# Patient Record
Sex: Female | Born: 1955 | Race: White | Hispanic: No | Marital: Married | Smoking: Former smoker
Health system: Southern US, Community
[De-identification: ages and names within clinical notes are randomized; demographics above are authoritative.]

## PROBLEM LIST (undated history)

## (undated) DIAGNOSIS — E785 Hyperlipidemia, unspecified: Secondary | ICD-10-CM

## (undated) DIAGNOSIS — G43909 Migraine, unspecified, not intractable, without status migrainosus: Secondary | ICD-10-CM

## (undated) DIAGNOSIS — K219 Gastro-esophageal reflux disease without esophagitis: Secondary | ICD-10-CM

## (undated) DIAGNOSIS — N870 Mild cervical dysplasia: Secondary | ICD-10-CM

## (undated) DIAGNOSIS — A64 Unspecified sexually transmitted disease: Secondary | ICD-10-CM

## (undated) DIAGNOSIS — F419 Anxiety disorder, unspecified: Secondary | ICD-10-CM

## (undated) DIAGNOSIS — M542 Cervicalgia: Secondary | ICD-10-CM

## (undated) DIAGNOSIS — Z872 Personal history of diseases of the skin and subcutaneous tissue: Secondary | ICD-10-CM

## (undated) DIAGNOSIS — T7840XA Allergy, unspecified, initial encounter: Secondary | ICD-10-CM

## (undated) DIAGNOSIS — I10 Essential (primary) hypertension: Secondary | ICD-10-CM

## (undated) HISTORY — DX: Migraine, unspecified, not intractable, without status migrainosus: G43.909

## (undated) HISTORY — DX: Cervicalgia: M54.2

## (undated) HISTORY — DX: Essential (primary) hypertension: I10

## (undated) HISTORY — DX: Allergy, unspecified, initial encounter: T78.40XA

## (undated) HISTORY — DX: Gastro-esophageal reflux disease without esophagitis: K21.9

## (undated) HISTORY — DX: Mild cervical dysplasia: N87.0

## (undated) HISTORY — PX: OOPHORECTOMY: SHX86

## (undated) HISTORY — DX: Hyperlipidemia, unspecified: E78.5

## (undated) HISTORY — DX: Personal history of diseases of the skin and subcutaneous tissue: Z87.2

## (undated) HISTORY — PX: CHOLECYSTECTOMY: SHX55

## (undated) HISTORY — PX: PELVIC LAPAROSCOPY: SHX162

## (undated) HISTORY — PX: BACK SURGERY: SHX140

## (undated) HISTORY — DX: Anxiety disorder, unspecified: F41.9

## (undated) HISTORY — PX: COLPOSCOPY: SHX161

## (undated) HISTORY — PX: CARPAL TUNNEL RELEASE: SHX101

## (undated) HISTORY — PX: TONSILLECTOMY: SUR1361

## (undated) HISTORY — DX: Unspecified sexually transmitted disease: A64

---

## 1998-07-02 HISTORY — PX: VAGINAL HYSTERECTOMY: SUR661

## 1998-10-17 ENCOUNTER — Inpatient Hospital Stay (HOSPITAL_COMMUNITY): Admission: RE | Admit: 1998-10-17 | Discharge: 1998-10-19 | Payer: Self-pay | Admitting: Obstetrics and Gynecology

## 1999-09-28 ENCOUNTER — Other Ambulatory Visit: Admission: RE | Admit: 1999-09-28 | Discharge: 1999-09-28 | Payer: Self-pay | Admitting: Obstetrics and Gynecology

## 2000-05-02 ENCOUNTER — Observation Stay (HOSPITAL_COMMUNITY): Admission: RE | Admit: 2000-05-02 | Discharge: 2000-05-03 | Payer: Self-pay | Admitting: Obstetrics and Gynecology

## 2000-06-04 ENCOUNTER — Emergency Department (HOSPITAL_COMMUNITY): Admission: EM | Admit: 2000-06-04 | Discharge: 2000-06-04 | Payer: Self-pay | Admitting: Emergency Medicine

## 2000-06-04 ENCOUNTER — Encounter: Payer: Self-pay | Admitting: Emergency Medicine

## 2000-06-07 ENCOUNTER — Encounter: Payer: Self-pay | Admitting: Neurology

## 2000-06-07 ENCOUNTER — Encounter: Admission: RE | Admit: 2000-06-07 | Discharge: 2000-06-07 | Payer: Self-pay | Admitting: Neurology

## 2000-07-18 ENCOUNTER — Encounter: Payer: Self-pay | Admitting: Family Medicine

## 2000-07-18 ENCOUNTER — Ambulatory Visit (HOSPITAL_COMMUNITY): Admission: RE | Admit: 2000-07-18 | Discharge: 2000-07-18 | Payer: Self-pay | Admitting: Family Medicine

## 2000-10-29 ENCOUNTER — Other Ambulatory Visit: Admission: RE | Admit: 2000-10-29 | Discharge: 2000-10-29 | Payer: Self-pay | Admitting: Obstetrics and Gynecology

## 2003-03-16 ENCOUNTER — Other Ambulatory Visit: Admission: RE | Admit: 2003-03-16 | Discharge: 2003-03-16 | Payer: Self-pay | Admitting: Obstetrics and Gynecology

## 2003-04-26 ENCOUNTER — Observation Stay (HOSPITAL_COMMUNITY): Admission: RE | Admit: 2003-04-26 | Discharge: 2003-04-27 | Payer: Self-pay | Admitting: Obstetrics and Gynecology

## 2003-07-03 HISTORY — PX: CARPAL TUNNEL RELEASE: SHX101

## 2004-05-01 ENCOUNTER — Other Ambulatory Visit: Admission: RE | Admit: 2004-05-01 | Discharge: 2004-05-01 | Payer: Self-pay | Admitting: Obstetrics and Gynecology

## 2005-02-27 ENCOUNTER — Ambulatory Visit: Payer: Self-pay | Admitting: Internal Medicine

## 2006-03-14 ENCOUNTER — Ambulatory Visit: Payer: Self-pay | Admitting: Internal Medicine

## 2006-08-20 ENCOUNTER — Ambulatory Visit: Payer: Self-pay | Admitting: Internal Medicine

## 2007-09-25 ENCOUNTER — Other Ambulatory Visit: Admission: RE | Admit: 2007-09-25 | Discharge: 2007-09-25 | Payer: Self-pay | Admitting: Obstetrics and Gynecology

## 2007-10-06 ENCOUNTER — Ambulatory Visit (HOSPITAL_COMMUNITY): Admission: RE | Admit: 2007-10-06 | Discharge: 2007-10-06 | Payer: Self-pay | Admitting: Obstetrics and Gynecology

## 2008-01-31 ENCOUNTER — Emergency Department: Payer: Self-pay | Admitting: Emergency Medicine

## 2008-02-15 ENCOUNTER — Emergency Department: Payer: Self-pay | Admitting: Emergency Medicine

## 2008-02-15 ENCOUNTER — Other Ambulatory Visit: Payer: Self-pay

## 2008-02-16 ENCOUNTER — Ambulatory Visit: Payer: Self-pay | Admitting: Internal Medicine

## 2008-06-07 ENCOUNTER — Ambulatory Visit: Payer: Self-pay | Admitting: Unknown Physician Specialty

## 2009-03-02 ENCOUNTER — Other Ambulatory Visit: Admission: RE | Admit: 2009-03-02 | Discharge: 2009-03-02 | Payer: Self-pay | Admitting: Obstetrics and Gynecology

## 2009-03-02 ENCOUNTER — Ambulatory Visit: Payer: Self-pay | Admitting: Obstetrics and Gynecology

## 2009-03-02 ENCOUNTER — Encounter: Payer: Self-pay | Admitting: Obstetrics and Gynecology

## 2009-03-02 ENCOUNTER — Ambulatory Visit (HOSPITAL_COMMUNITY): Admission: RE | Admit: 2009-03-02 | Discharge: 2009-03-02 | Payer: Self-pay | Admitting: Obstetrics and Gynecology

## 2009-12-17 ENCOUNTER — Ambulatory Visit: Payer: Self-pay | Admitting: Ophthalmology

## 2010-04-27 ENCOUNTER — Ambulatory Visit (HOSPITAL_COMMUNITY): Admission: RE | Admit: 2010-04-27 | Discharge: 2010-04-27 | Payer: Self-pay | Admitting: Obstetrics and Gynecology

## 2010-04-27 ENCOUNTER — Ambulatory Visit: Payer: Self-pay | Admitting: Obstetrics and Gynecology

## 2010-04-27 ENCOUNTER — Other Ambulatory Visit: Admission: RE | Admit: 2010-04-27 | Discharge: 2010-04-27 | Payer: Self-pay | Admitting: Obstetrics and Gynecology

## 2011-04-09 ENCOUNTER — Other Ambulatory Visit: Payer: Self-pay | Admitting: Obstetrics and Gynecology

## 2011-04-09 DIAGNOSIS — Z1231 Encounter for screening mammogram for malignant neoplasm of breast: Secondary | ICD-10-CM

## 2011-05-02 ENCOUNTER — Encounter: Payer: Self-pay | Admitting: Gynecology

## 2011-05-02 DIAGNOSIS — G43909 Migraine, unspecified, not intractable, without status migrainosus: Secondary | ICD-10-CM | POA: Insufficient documentation

## 2011-05-10 ENCOUNTER — Ambulatory Visit (HOSPITAL_COMMUNITY): Payer: Self-pay

## 2011-05-11 ENCOUNTER — Ambulatory Visit (HOSPITAL_COMMUNITY)
Admission: RE | Admit: 2011-05-11 | Discharge: 2011-05-11 | Disposition: A | Discharge status: Home / Self Care | Payer: 59 | Source: Ambulatory Visit | Attending: Obstetrics and Gynecology | Admitting: Obstetrics and Gynecology

## 2011-05-11 ENCOUNTER — Ambulatory Visit (INDEPENDENT_AMBULATORY_CARE_PROVIDER_SITE_OTHER): Payer: 59 | Admitting: Obstetrics and Gynecology

## 2011-05-11 ENCOUNTER — Encounter: Payer: Self-pay | Admitting: Obstetrics and Gynecology

## 2011-05-11 VITALS — BP 120/64 | Ht 64.0 in | Wt 169.0 lb

## 2011-05-11 DIAGNOSIS — N393 Stress incontinence (female) (male): Secondary | ICD-10-CM

## 2011-05-11 DIAGNOSIS — Z01419 Encounter for gynecological examination (general) (routine) without abnormal findings: Secondary | ICD-10-CM

## 2011-05-11 DIAGNOSIS — N952 Postmenopausal atrophic vaginitis: Secondary | ICD-10-CM

## 2011-05-11 DIAGNOSIS — Z780 Asymptomatic menopausal state: Secondary | ICD-10-CM

## 2011-05-11 DIAGNOSIS — Z1231 Encounter for screening mammogram for malignant neoplasm of breast: Secondary | ICD-10-CM | POA: Insufficient documentation

## 2011-05-11 DIAGNOSIS — N951 Menopausal and female climacteric states: Secondary | ICD-10-CM

## 2011-05-11 MED ORDER — ESTRADIOL 0.1 MG/GM VA CREA: TOPICAL_CREAM | VAGINAL | Status: DC

## 2011-05-11 NOTE — Progress Notes (Signed)
Patient came to see me today for her annual GYN exam. She is stopped her oral estrogen and is doing well without it. She continues use estrogen cream for vaginal dryness with good results. She has noticed some loss of urine with coughing laughing and sneezing. She continues take Valtrex daily to prevent recurrence of HSV. It seems and working well. She had her mammogram today. She had a bone density in [place] was normal. Her labs done by her PCP. She is having no vaginal bleeding or pelvic pain.  HEENT: Within normal limits. Kennon Portela present Neck: No masses. Supraclavicular lymph nodes: Not enlarged. Breasts: Examined in both sitting and lying position. Symmetrical without skin changes or masses. Abdomen: Soft no masses guarding or rebound. No hernias. Pelvic: External within normal limits. BUS within normal limits. Vaginal examination shows good estrogen effect, no cystocele enterocele or rectocele. Cervix and uterus absent. Adnexa within normal limits. Rectovaginal confirmatory. Extremities within normal limits.  Assessment: #1. Atrophic vaginitis #2. Urinary stress incontinence #3. HSV-2  Plan: Continue estradiol cream. Continue yearly mammograms.continue Valtrex daily. Instructed on Kegel exercises.

## 2011-09-29 ENCOUNTER — Emergency Department: Payer: Self-pay | Admitting: Emergency Medicine

## 2011-09-29 LAB — CBC
HCT: 46.8 % (ref 35.0–47.0)
HGB: 15.7 g/dL (ref 12.0–16.0)
MCHC: 33.6 g/dL (ref 32.0–36.0)
RBC: 5.13 10*6/uL (ref 3.80–5.20)

## 2011-09-29 LAB — URINALYSIS, COMPLETE
Glucose,UR: NEGATIVE mg/dL (ref 0–75)
Leukocyte Esterase: NEGATIVE
Nitrite: NEGATIVE
Ph: 5 (ref 4.5–8.0)
Protein: NEGATIVE
RBC,UR: 2 /HPF (ref 0–5)
Specific Gravity: 1.024 (ref 1.003–1.030)
Squamous Epithelial: 1
WBC UR: 2 /HPF (ref 0–5)

## 2011-09-29 LAB — COMPREHENSIVE METABOLIC PANEL
Alkaline Phosphatase: 91 U/L (ref 50–136)
Anion Gap: 11 (ref 7–16)
BUN: 17 mg/dL (ref 7–18)
Bilirubin,Total: 0.8 mg/dL (ref 0.2–1.0)
Chloride: 104 mmol/L (ref 98–107)
EGFR (African American): >60
Glucose: 107 mg/dL — ABNORMAL HIGH (ref 65–99)
Osmolality: 287 (ref 275–301)
SGOT(AST): 23 U/L (ref 15–37)
SGPT (ALT): 30 U/L
Sodium: 143 mmol/L (ref 136–145)
Total Protein: 6.8 g/dL (ref 6.4–8.2)

## 2011-09-29 LAB — LIPASE, BLOOD: Lipase: 138 U/L (ref 73–393)

## 2012-05-20 ENCOUNTER — Encounter: Payer: Self-pay | Admitting: Obstetrics and Gynecology

## 2012-05-20 ENCOUNTER — Ambulatory Visit (INDEPENDENT_AMBULATORY_CARE_PROVIDER_SITE_OTHER): Payer: 59 | Admitting: Obstetrics and Gynecology

## 2012-05-20 VITALS — BP 120/78 | Ht 65.0 in | Wt 158.0 lb

## 2012-05-20 DIAGNOSIS — Z01419 Encounter for gynecological examination (general) (routine) without abnormal findings: Secondary | ICD-10-CM

## 2012-05-20 MED ORDER — VALACYCLOVIR HCL 500 MG PO TABS: 500.0000 mg | ORAL_TABLET | Freq: Every day | ORAL | Status: DC

## 2012-05-20 MED ORDER — TERCONAZOLE 0.8 % VA CREA: 1.0000 | TOPICAL_CREAM | Freq: Every day | VAGINAL | Status: DC

## 2012-05-20 MED ORDER — OSPEMIFENE 60 MG PO TABS: 60.0000 mg | ORAL_TABLET | Freq: Every day | ORAL | Status: DC

## 2012-05-20 NOTE — Progress Notes (Signed)
Patient came to see me today for her annual GYN exam. In 2000 she had a vaginal hysterectomy for endometriosis and recurrent cervical dysplasia. Since then she has had normal yearly Pap smears. Her last Pap smear was 2012. 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 In 2004 her pain recurred and she had diagnostic laparoscopy with right salpingo-oophorectomy with findings of pelvic adhesive disease.She is due for her mammogram. She had a normal bone density in April, 2009. She uses daily Valtrex to prevent HSV outbreaks with excellent results. She continues to have dyspareunia. We have tried estrogen cream but it is not  helped  completely. We have wondered if it isn't due to pelvic adhesive disease.She does her lab through PCP.  HEENT: Within normal limits. Neck: No masses. Supraclavicular lymph nodes: Not enlarged. Breasts: Examined in both sitting and lying position. Symmetrical without skin changes or masses. Abdomen: Soft no masses guarding or rebound. No hernias. Pelvic: External within normal limits. BUS within normal limits. Vaginal examination shows fair estrogen effect, no cystocele enterocele or rectocele. The patient has point tenderness at the top of the vaginal cuff which is where she gets dyspareunia. Rugae are diminished there. Cervix and uterus absent. Adnexa within normal limits. Rectovaginal confirmatory. Extremities within normal limits.  Assessment: #1. Atrophic vaginitis #2. HSV-2 #3. Recurrent CIN  Plan: Patient had read about osphenia. Since her exam is not completely normal in terms of estrogen effect I think it is worth switching her. We discussed the risk of DVT and CNS bleed. She will do 60 mg daily. She will continue Valtrex daily. She will schedule mammogram and bone density. She is now had normal Pap smears since 2000 so we did not do a Pap today but I think she should continue periodic Paps.

## 2012-05-20 NOTE — Patient Instructions (Signed)
Schedule mammogram and bone density.

## 2012-05-20 NOTE — Addendum Note (Signed)
Addended by: Dayna Barker on: 05/20/2012 03:29 PM   Modules accepted: Orders

## 2012-05-27 ENCOUNTER — Other Ambulatory Visit: Payer: Self-pay | Admitting: Cardiology

## 2012-05-27 ENCOUNTER — Other Ambulatory Visit: Payer: Self-pay | Admitting: Obstetrics and Gynecology

## 2012-05-27 DIAGNOSIS — Z1231 Encounter for screening mammogram for malignant neoplasm of breast: Secondary | ICD-10-CM

## 2012-05-30 ENCOUNTER — Encounter: Payer: Self-pay | Admitting: Obstetrics and Gynecology

## 2012-06-11 ENCOUNTER — Other Ambulatory Visit: Payer: Self-pay | Admitting: Obstetrics and Gynecology

## 2012-06-11 DIAGNOSIS — Z13820 Encounter for screening for osteoporosis: Secondary | ICD-10-CM

## 2012-06-12 ENCOUNTER — Ambulatory Visit (INDEPENDENT_AMBULATORY_CARE_PROVIDER_SITE_OTHER): Payer: 59

## 2012-06-12 DIAGNOSIS — Z13820 Encounter for screening for osteoporosis: Secondary | ICD-10-CM

## 2012-06-17 ENCOUNTER — Ambulatory Visit (HOSPITAL_COMMUNITY)
Admission: RE | Admit: 2012-06-17 | Discharge: 2012-06-17 | Disposition: A | Discharge status: Home / Self Care | Payer: 59 | Source: Ambulatory Visit | Attending: Obstetrics and Gynecology | Admitting: Obstetrics and Gynecology

## 2012-06-17 DIAGNOSIS — Z1231 Encounter for screening mammogram for malignant neoplasm of breast: Secondary | ICD-10-CM | POA: Insufficient documentation

## 2013-02-19 ENCOUNTER — Ambulatory Visit: Payer: Self-pay

## 2013-04-29 ENCOUNTER — Other Ambulatory Visit: Payer: Self-pay | Admitting: Gynecology

## 2013-04-29 DIAGNOSIS — Z1231 Encounter for screening mammogram for malignant neoplasm of breast: Secondary | ICD-10-CM

## 2013-06-18 ENCOUNTER — Encounter: Payer: Self-pay | Admitting: Gynecology

## 2013-06-18 ENCOUNTER — Ambulatory Visit (INDEPENDENT_AMBULATORY_CARE_PROVIDER_SITE_OTHER): Payer: 59 | Admitting: Gynecology

## 2013-06-18 VITALS — BP 116/76 | Ht 65.0 in | Wt 164.0 lb

## 2013-06-18 DIAGNOSIS — Z01419 Encounter for gynecological examination (general) (routine) without abnormal findings: Secondary | ICD-10-CM

## 2013-06-18 DIAGNOSIS — D179 Benign lipomatous neoplasm, unspecified: Secondary | ICD-10-CM

## 2013-06-18 DIAGNOSIS — B009 Herpesviral infection, unspecified: Secondary | ICD-10-CM

## 2013-06-18 DIAGNOSIS — N898 Other specified noninflammatory disorders of vagina: Secondary | ICD-10-CM

## 2013-06-18 DIAGNOSIS — L293 Anogenital pruritus, unspecified: Secondary | ICD-10-CM

## 2013-06-18 DIAGNOSIS — A609 Anogenital herpesviral infection, unspecified: Secondary | ICD-10-CM

## 2013-06-18 DIAGNOSIS — N952 Postmenopausal atrophic vaginitis: Secondary | ICD-10-CM

## 2013-06-18 LAB — LIPID PANEL
Cholesterol: 166 mg/dL (ref 0–200)
Total CHOL/HDL Ratio: 2.4 Ratio

## 2013-06-18 LAB — CBC WITH DIFFERENTIAL/PLATELET
Eosinophils Relative: 1 % (ref 0–5)
HCT: 40 % (ref 36.0–46.0)
Lymphocytes Relative: 16 % (ref 12–46)
Lymphs Abs: 1 10*3/uL (ref 0.7–4.0)
MCV: 89.1 fL (ref 78.0–100.0)
Monocytes Absolute: 0.4 10*3/uL (ref 0.1–1.0)
Neutro Abs: 5 10*3/uL (ref 1.7–7.7)
Platelets: 344 10*3/uL (ref 150–400)
RBC: 4.49 MIL/uL (ref 3.87–5.11)
WBC: 6.6 10*3/uL (ref 4.0–10.5)

## 2013-06-18 LAB — COMPREHENSIVE METABOLIC PANEL
ALT: 18 U/L (ref 0–35)
Albumin: 4.4 g/dL (ref 3.5–5.2)
CO2: 29 mEq/L (ref 19–32)
Calcium: 9.1 mg/dL (ref 8.4–10.5)
Chloride: 103 mEq/L (ref 96–112)
Creat: 0.77 mg/dL (ref 0.50–1.10)
Potassium: 4 mEq/L (ref 3.5–5.3)

## 2013-06-18 LAB — WET PREP FOR TRICH, YEAST, CLUE: Clue Cells Wet Prep HPF POC: NONE SEEN

## 2013-06-18 MED ORDER — ESTRADIOL 0.1 MG/GM VA CREA: TOPICAL_CREAM | VAGINAL | Status: DC

## 2013-06-18 MED ORDER — ACYCLOVIR 400 MG PO TABS: 400.0000 mg | ORAL_TABLET | Freq: Every day | ORAL | Status: DC

## 2013-06-18 MED ORDER — METRONIDAZOLE 0.75 % VA GEL: 1.0000 | Freq: Two times a day (BID) | VAGINAL | Status: DC

## 2013-06-18 NOTE — Patient Instructions (Signed)
Office will call you to help arrange for the general surgical evaluation for the mass on your left shoulder. Call your gastroenterologist today in reference to your abdominal discomfort. Followup with me in one year, sooner if any issues.

## 2013-06-18 NOTE — Progress Notes (Signed)
Kathy Mccormick [birth year] [id number]        57 y.o.  G1P0010 for annual exam.  Former patient of Dr. Eda Mccormick. Several issues noted below.  Past medical history,surgical history, problem list, medications, allergies, family history and social history were all reviewed and documented in the EPIC chart.  ROS:  Performed and pertinent positives and negatives are included in the history, assessment and plan .  Exam: Kathy Mccormick assistant Filed Vitals:   06/18/13 0813  BP: 116/76  Height: 5\' 5"  (1.651 m)  Weight: 164 lb (74.39 kg)   General appearance  Normal Skin 3-4 cm lipoma left posterior shoulder. No overlying skin changes. Freely mobile, nontender Head/Neck normal with no cervical or supraclavicular adenopathy thyroid normal Lungs  clear Cardiac RR, without RMG Abdominal  soft, nontender, without masses, organomegaly or hernia Breasts  examined lying and sitting without masses, retractions, discharge or axillary adenopathy. Pelvic  Ext/BUS/vagina  Normal with mild atrophic changes.   Adnexa  Without masses or tenderness    Anus and perineum  Normal   Rectovaginal  Normal sphincter tone without palpated masses or tenderness.    Assessment/Plan:  56 y.o. G35P0010 female for annual exam.   1. Postmenopausal/atrophic vaginitis. Status post vaginal hysterectomy 2000 for endometriosis subsequent RSO and LSO as separate procedures 2001 in 2004. Had been on ERT but discontinued and not having any issues with hot flashes or night sweats. She has been having problems with vaginal dryness and dyspareunia and has been on estradiol vaginal cream twice weekly. She has run out and has not used it for the past month or so he notes some vaginal irritation and pruritus. A wet prep today is negative and I think that her symptoms are due to the lack of estrogen.  I reviewed the whole issue of HRT with her to include the WHI study with increased risk of stroke, heart attack, DVT and breast cancer. The ACOG and  NAMS statements for lowest dose for the shortest period of time reviewed. Transdermal versus oral first-pass effect benefit discussed.  Vaginal options to include estradiol, formulated estrogen cream, Vagifem and Osphena discussed. The pros/cons, risks/benefits of each reviewed. Possibilities of absorption with global risks as outlined above also discussed. Patient's comfortable with vaginal estrogen cream I prescribed her x1 year. It actually was written on the prescription for 3 times weekly but she uses it twice weekly. 2. Lipoma left posterior shoulder. Patient notes the last 6 months or so an enlarging left shoulder mass. It appears to be a classic lipoma on exam. As it does seem to be enlarging up recommended she see a general surgeon and have it excised and she agrees with this will help her make this arrangement. 3. Genital herpes. Takes acyclovir 400 mg daily for suppression doing well wants to continue and I refilled her x1 year. 4. Pap smear 2012. No Pap smear done today. 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 Verl Mccormick note and normal Pap smears afterwards. Options stop screening altogether versus less frequent screening intervals reviewed. We'll plan on every three-year Pap smears for now. Repeat Pap smear next year 3 year interval. 5. Mammography 06/2012. Patient to schedule mammogram now. Continue with annual mammography is. SBE monthly reviewed. 6. DEXA 2013 normal. Recommend repeat at 5 year interval. 7. Colonoscopy several days ago. She still notes discomfort in the lower abdomen. Her exam is benign today. I've recommended she call her gastroenterologist today in followup as they recommend.  8. Health maintenance. Baseline CBC  comprehensive metabolic panel lipid profile TSH urinalysis vitamin D done. Followup one year, sooner as needed.   Note: This document was prepared with digital dictation and possible smart phrase technology. Any transcriptional errors that  result from this process are unintentional.   Dara Lords MD, 9:07 AM 06/18/2013

## 2013-06-19 ENCOUNTER — Telehealth: Payer: Self-pay | Admitting: Gynecology

## 2013-06-19 ENCOUNTER — Ambulatory Visit (HOSPITAL_COMMUNITY)
Admission: RE | Admit: 2013-06-19 | Discharge: 2013-06-19 | Disposition: A | Discharge status: Home / Self Care | Payer: 59 | Source: Ambulatory Visit | Attending: Gynecology | Admitting: Gynecology

## 2013-06-19 ENCOUNTER — Telehealth: Payer: Self-pay | Admitting: *Deleted

## 2013-06-19 DIAGNOSIS — R7309 Other abnormal glucose: Secondary | ICD-10-CM

## 2013-06-19 DIAGNOSIS — Z1231 Encounter for screening mammogram for malignant neoplasm of breast: Secondary | ICD-10-CM | POA: Insufficient documentation

## 2013-06-19 DIAGNOSIS — D1720 Benign lipomatous neoplasm of skin and subcutaneous tissue of unspecified limb: Secondary | ICD-10-CM

## 2013-06-19 LAB — URINALYSIS W MICROSCOPIC + REFLEX CULTURE
Bilirubin Urine: NEGATIVE
Glucose, UA: NEGATIVE mg/dL
Leukocytes, UA: NEGATIVE
Protein, ur: NEGATIVE mg/dL
RBC / HPF: NONE SEEN RBC/hpf (ref <3)
Specific Gravity, Urine: 1.019 (ref 1.005–1.030)
WBC, UA: NONE SEEN WBC/hpf (ref <3)
pH: 6 (ref 5.0–8.0)

## 2013-06-19 NOTE — Telephone Encounter (Signed)
Pt informed,order placed for recheck

## 2013-06-19 NOTE — Telephone Encounter (Signed)
Appointment on 07/07/13 @ 1:15 pm. Pt informed with the below.

## 2013-06-19 NOTE — Telephone Encounter (Signed)
Message copied by Aura Camps on Fri Jun 19, 2013 10:56 AM ------      Message from: Dara Lords      Created: Thu Jun 18, 2013  9:12 AM       Schedule an appointment with General surgery reference enlarging left shoulder lipoma, desires removal ------

## 2013-06-19 NOTE — Telephone Encounter (Signed)
Tell patient her glucose is minimally elevated. Remainder of her lab work was normal. Recommend repeating a fasting glucose at her convenience.

## 2013-06-24 ENCOUNTER — Encounter: Payer: Self-pay | Admitting: Obstetrics and Gynecology

## 2013-07-07 ENCOUNTER — Ambulatory Visit (INDEPENDENT_AMBULATORY_CARE_PROVIDER_SITE_OTHER): Payer: Self-pay | Admitting: Surgery

## 2013-08-18 ENCOUNTER — Ambulatory Visit (INDEPENDENT_AMBULATORY_CARE_PROVIDER_SITE_OTHER): Payer: Self-pay | Admitting: Surgery

## 2013-09-02 ENCOUNTER — Ambulatory Visit (INDEPENDENT_AMBULATORY_CARE_PROVIDER_SITE_OTHER): Payer: Self-pay | Admitting: Surgery

## 2013-10-15 ENCOUNTER — Encounter (INDEPENDENT_AMBULATORY_CARE_PROVIDER_SITE_OTHER): Payer: Self-pay | Admitting: Surgery

## 2013-10-15 ENCOUNTER — Ambulatory Visit (INDEPENDENT_AMBULATORY_CARE_PROVIDER_SITE_OTHER): Payer: 59 | Admitting: Surgery

## 2013-10-16 ENCOUNTER — Encounter (INDEPENDENT_AMBULATORY_CARE_PROVIDER_SITE_OTHER): Payer: Self-pay | Admitting: Surgery

## 2013-10-19 NOTE — Progress Notes (Signed)
I never saw this patient.  Patient canceled appointment

## 2013-10-23 NOTE — Progress Notes (Signed)
Patient did not come to clinic.  Patient was not seen.  This is an error 

## 2013-10-23 NOTE — Progress Notes (Signed)
Patient did not come to clinic.  Patient was not seen.  This is an error

## 2013-11-19 NOTE — Progress Notes (Signed)
Patient did not come to clinic.  Patient was not seen.  This is an error 

## 2014-04-06 ENCOUNTER — Other Ambulatory Visit: Payer: Self-pay | Admitting: Gynecology

## 2014-04-06 DIAGNOSIS — Z1231 Encounter for screening mammogram for malignant neoplasm of breast: Secondary | ICD-10-CM

## 2014-05-03 ENCOUNTER — Encounter (INDEPENDENT_AMBULATORY_CARE_PROVIDER_SITE_OTHER): Payer: Self-pay | Admitting: Surgery

## 2014-06-03 ENCOUNTER — Encounter: Payer: Self-pay | Admitting: *Deleted

## 2014-06-14 ENCOUNTER — Ambulatory Visit: Payer: 59 | Admitting: General Surgery

## 2014-06-21 ENCOUNTER — Ambulatory Visit (INDEPENDENT_AMBULATORY_CARE_PROVIDER_SITE_OTHER): Payer: 59 | Admitting: Gynecology

## 2014-06-21 ENCOUNTER — Other Ambulatory Visit (HOSPITAL_COMMUNITY)
Admission: RE | Admit: 2014-06-21 | Discharge: 2014-06-21 | Disposition: A | Discharge status: Home / Self Care | Payer: 59 | Source: Ambulatory Visit | Attending: Gynecology | Admitting: Gynecology

## 2014-06-21 ENCOUNTER — Encounter: Payer: Self-pay | Admitting: Gynecology

## 2014-06-21 ENCOUNTER — Ambulatory Visit (HOSPITAL_COMMUNITY)
Admission: RE | Admit: 2014-06-21 | Discharge: 2014-06-21 | Disposition: A | Discharge status: Home / Self Care | Payer: 59 | Source: Ambulatory Visit | Attending: Gynecology | Admitting: Gynecology

## 2014-06-21 VITALS — BP 120/76 | Ht 65.0 in | Wt 164.0 lb

## 2014-06-21 DIAGNOSIS — Z01419 Encounter for gynecological examination (general) (routine) without abnormal findings: Secondary | ICD-10-CM | POA: Diagnosis present

## 2014-06-21 DIAGNOSIS — D179 Benign lipomatous neoplasm, unspecified: Secondary | ICD-10-CM

## 2014-06-21 DIAGNOSIS — Z1231 Encounter for screening mammogram for malignant neoplasm of breast: Secondary | ICD-10-CM

## 2014-06-21 MED ORDER — ACYCLOVIR 400 MG PO TABS: 400.0000 mg | ORAL_TABLET | Freq: Every day | ORAL | Status: DC

## 2014-06-21 MED ORDER — ESTRADIOL 0.1 MG/GM VA CREA: TOPICAL_CREAM | VAGINAL | Status: DC

## 2014-06-21 NOTE — Patient Instructions (Signed)
You may obtain a copy of any labs that were done today by logging onto MyChart as outlined in the instructions provided with your AVS (after visit summary). The office will not call with normal lab results but certainly if there are any significant abnormalities then we will contact you.   Health Maintenance, Female A healthy lifestyle and preventative care can promote health and wellness.  Maintain regular health, dental, and eye exams.  Eat a healthy diet. Foods like vegetables, fruits, whole grains, low-fat dairy products, and lean protein foods contain the nutrients you need without too many calories. Decrease your intake of foods high in solid fats, added sugars, and salt. Get information about a proper diet from your caregiver, if necessary.  Regular physical exercise is one of the most important things you can do for your health. Most adults should get at least 150 minutes of moderate-intensity exercise (any activity that increases your heart rate and causes you to sweat) each week. In addition, most adults need muscle-strengthening exercises on 2 or more days a week.   Maintain a healthy weight. The body mass index (BMI) is a screening tool to identify possible weight problems. It provides an estimate of body fat based on height and weight. Your caregiver can help determine your BMI, and can help you achieve or maintain a healthy weight. For adults 20 years and older:  A BMI below 18.5 is considered underweight.  A BMI of 18.5 to 24.9 is normal.  A BMI of 25 to 29.9 is considered overweight.  A BMI of 30 and above is considered obese.  Maintain normal blood lipids and cholesterol by exercising and minimizing your intake of saturated fat. Eat a balanced diet with plenty of fruits and vegetables. Blood tests for lipids and cholesterol should begin at age 61 and be repeated every 5 years. If your lipid or cholesterol levels are high, you are over 50, or you are a high risk for heart  disease, you may need your cholesterol levels checked more frequently.Ongoing high lipid and cholesterol levels should be treated with medicines if diet and exercise are not effective.  If you smoke, find out from your caregiver how to quit. If you do not use tobacco, do not start.  Lung cancer screening is recommended for adults aged 33 80 years who are at high risk for developing lung cancer because of a history of smoking. Yearly low-dose computed tomography (CT) is recommended for people who have at least a 30-pack-year history of smoking and are a current smoker or have quit within the past 15 years. A pack year of smoking is smoking an average of 1 pack of cigarettes a day for 1 year (for example: 1 pack a day for 30 years or 2 packs a day for 15 years). Yearly screening should continue until the smoker has stopped smoking for at least 15 years. Yearly screening should also be stopped for people who develop a health problem that would prevent them from having lung cancer treatment.  If you are pregnant, do not drink alcohol. If you are breastfeeding, be very cautious about drinking alcohol. If you are not pregnant and choose to drink alcohol, do not exceed 1 drink per day. One drink is considered to be 12 ounces (355 mL) of beer, 5 ounces (148 mL) of wine, or 1.5 ounces (44 mL) of liquor.  Avoid use of street drugs. Do not share needles with anyone. Ask for help if you need support or instructions about stopping  the use of drugs.  High blood pressure causes heart disease and increases the risk of stroke. Blood pressure should be checked at least every 1 to 2 years. Ongoing high blood pressure should be treated with medicines, if weight loss and exercise are not effective.  If you are 59 to 56 years old, ask your caregiver if you should take aspirin to prevent strokes.  Diabetes screening involves taking a blood sample to check your fasting blood sugar level. This should be done once every 3  years, after age 91, if you are within normal weight and without risk factors for diabetes. Testing should be considered at a younger age or be carried out more frequently if you are overweight and have at least 1 risk factor for diabetes.  Breast cancer screening is essential preventative care for women. You should practice "breast self-awareness." This means understanding the normal appearance and feel of your breasts and may include breast self-examination. Any changes detected, no matter how small, should be reported to a caregiver. Women in their 66s and 30s should have a clinical breast exam (CBE) by a caregiver as part of a regular health exam every 1 to 3 years. After age 101, women should have a CBE every year. Starting at age 100, women should consider having a mammogram (breast X-ray) every year. Women who have a family history of breast cancer should talk to their caregiver about genetic screening. Women at a high risk of breast cancer should talk to their caregiver about having an MRI and a mammogram every year.  Breast cancer gene (BRCA)-related cancer risk assessment is recommended for women who have family members with BRCA-related cancers. BRCA-related cancers include breast, ovarian, tubal, and peritoneal cancers. Having family members with these cancers may be associated with an increased risk for harmful changes (mutations) in the breast cancer genes BRCA1 and BRCA2. Results of the assessment will determine the need for genetic counseling and BRCA1 and BRCA2 testing.  The Pap test is a screening test for cervical cancer. Women should have a Pap test starting at age 57. Between ages 25 and 35, Pap tests should be repeated every 2 years. Beginning at age 37, you should have a Pap test every 3 years as long as the past 3 Pap tests have been normal. If you had a hysterectomy for a problem that was not cancer or a condition that could lead to cancer, then you no longer need Pap tests. If you are  between ages 50 and 76, and you have had normal Pap tests going back 10 years, you no longer need Pap tests. If you have had past treatment for cervical cancer or a condition that could lead to cancer, you need Pap tests and screening for cancer for at least 20 years after your treatment. If Pap tests have been discontinued, risk factors (such as a new sexual partner) need to be reassessed to determine if screening should be resumed. Some women have medical problems that increase the chance of getting cervical cancer. In these cases, your caregiver may recommend more frequent screening and Pap tests.  The human papillomavirus (HPV) test is an additional test that may be used for cervical cancer screening. The HPV test looks for the virus that can cause the cell changes on the cervix. The cells collected during the Pap test can be tested for HPV. The HPV test could be used to screen women aged 44 years and older, and should be used in women of any age  who have unclear Pap test results. After the age of 55, women should have HPV testing at the same frequency as a Pap test.  Colorectal cancer can be detected and often prevented. Most routine colorectal cancer screening begins at the age of 44 and continues through age 20. However, your caregiver may recommend screening at an earlier age if you have risk factors for colon cancer. On a yearly basis, your caregiver may provide home test kits to check for hidden blood in the stool. Use of a small camera at the end of a tube, to directly examine the colon (sigmoidoscopy or colonoscopy), can detect the earliest forms of colorectal cancer. Talk to your caregiver about this at age 86, when routine screening begins. Direct examination of the colon should be repeated every 5 to 10 years through age 13, unless early forms of pre-cancerous polyps or small growths are found.  Hepatitis C blood testing is recommended for all people born from [birth year] through 1963 and any  individual with known risks for hepatitis C.  Practice safe sex. Use condoms and avoid high-risk sexual practices to reduce the spread of sexually transmitted infections (STIs). Sexually active women aged 36 and younger should be checked for Chlamydia, which is a common sexually transmitted infection. Older women with new or multiple partners should also be tested for Chlamydia. Testing for other STIs is recommended if you are sexually active and at increased risk.  Osteoporosis is a disease in which the bones lose minerals and strength with aging. This can result in serious bone fractures. The risk of osteoporosis can be identified using a bone density scan. Women ages 20 and over and women at risk for fractures or osteoporosis should discuss screening with their caregivers. Ask your caregiver whether you should be taking a calcium supplement or vitamin D to reduce the rate of osteoporosis.  Menopause can be associated with physical symptoms and risks. Hormone replacement therapy is available to decrease symptoms and risks. You should talk to your caregiver about whether hormone replacement therapy is right for you.  Use sunscreen. Apply sunscreen liberally and repeatedly throughout the day. You should seek shade when your shadow is shorter than you. Protect yourself by wearing long sleeves, pants, a wide-brimmed hat, and sunglasses year round, whenever you are outdoors.  Notify your caregiver of new moles or changes in moles, especially if there is a change in shape or color. Also notify your caregiver if a mole is larger than the size of a pencil eraser.  Stay current with your immunizations. Document Released: 01/01/2011 Document Revised: 10/13/2012 Document Reviewed: 01/01/2011 [facility] Patient Information 2014 [employer].

## 2014-06-21 NOTE — Progress Notes (Signed)
Kathy Mccormick 1955-12-02 [id number]        58 y.o.  G1P0010 for annual exam.  Several issues noted below.  Past medical history,surgical history, problem list, medications, allergies, family history and social history were all reviewed and documented as reviewed in the EPIC chart.  ROS:  Performed with pertinent positives and negatives included in the history, assessment and plan.   Additional significant findings :  none   Exam: [name] Counsellor Vitals:   06/21/14 0815  BP: 120/76  Height: 5\' 5"  (1.651 m)  Weight: 164 lb (74.39 kg)   General appearance:  Normal affect, orientation and appearance. Skin: Grossly normal excepting 3-4 cm classic lipoma left upper arm. HEENT: Without gross lesions.  No cervical or supraclavicular adenopathy. Thyroid normal.  Lungs:  Clear without wheezing, rales or rhonchi Cardiac: RR, without RMG Abdominal:  Soft, nontender, without masses, guarding, rebound, organomegaly or hernia Breasts:  Examined lying and sitting without masses, retractions, discharge or axillary adenopathy. Pelvic:  Ext/BUS/vagina normal. Pap done  Adnexa  Without masses or tenderness    Anus and perineum  Normal   Rectovaginal  Normal sphincter tone without palpated masses or tenderness.    Assessment/Plan:  58 y.o. G5P0010 female for annual exam.   1. Status post TVH in the past. Using Estrace vaginal cream twice weekly with good results. We reviewed again the risks of absorption include thrombosis/breast cancer. Patient's comfortable continuing I refilled her 1 year. 2. History HSV using acyclovir 400 mg daily suppression doing well. Wants to continue. Refill 1 year provided. 3. Lipoma left upper arm. Patient notes it slowly getting bigger. Recommended follow up with general surgeon to have excised and patient agrees to arrange this.  We discussed this last year but she never followed up as arranged. She agrees to call the surgeon's office and has the number. 4. Pap  smear 2012. Pap smear of vaginal cuff today. History of CIN-1 proceeding her hysterectomy in the past. Options to stop screening altogether or less frequent screening intervals reviewed. Will readdress on annual basis. 5. Mammography 06/2014. Continue with annual mammography. SBE monthly reviewed. 6. Colonoscopy 2014. Repeat at their recommended interval. 7. DEXA 2013 normal. Repeated age 71. Increased calcium vitamin D reviewed. Check vitamin D level today. 8. Health maintenance. Baseline CBC comprehensive metabolic panel lipid profile urinalysis vitamin D TSH ordered. Follow up 1 year, sooner as needed.     [name] MD, 8:58 AM 06/21/2014

## 2014-06-21 NOTE — Addendum Note (Signed)
Addended by: Nelva Nay on: 06/21/2014 09:05 AM   Modules accepted: Orders, SmartSet

## 2014-06-22 LAB — CYTOLOGY - PAP

## 2014-06-30 ENCOUNTER — Encounter: Payer: Self-pay | Admitting: *Deleted

## 2015-05-19 ENCOUNTER — Encounter: Payer: Self-pay | Admitting: Podiatry

## 2015-05-19 ENCOUNTER — Ambulatory Visit (INDEPENDENT_AMBULATORY_CARE_PROVIDER_SITE_OTHER): Payer: 59 | Admitting: Podiatry

## 2015-05-19 VITALS — BP 120/71 | HR 73 | Resp 18

## 2015-05-19 DIAGNOSIS — M722 Plantar fascial fibromatosis: Secondary | ICD-10-CM | POA: Diagnosis not present

## 2015-05-19 DIAGNOSIS — L600 Ingrowing nail: Secondary | ICD-10-CM

## 2015-05-19 DIAGNOSIS — L84 Corns and callosities: Secondary | ICD-10-CM

## 2015-05-19 DIAGNOSIS — M779 Enthesopathy, unspecified: Secondary | ICD-10-CM

## 2015-05-19 DIAGNOSIS — M79673 Pain in unspecified foot: Secondary | ICD-10-CM

## 2015-05-19 NOTE — Progress Notes (Signed)
   Subjective:    Patient ID: Kathy Mccormick, female    DOB: Feb 05, 1956, 59 y.o.   MRN: [id number]  HPI   59 year old female presents the office today requesting new orthotics. She states that she has heel pain, plantar fasciitis as well as tendinitis to her feet for which she gets orthotics. She states that as long as she wears the orthotic she is doing well how they're starting to rub causing a callus on the right foot and should have a new pair made. She denies any swelling or redness. No recent injury or trauma. No tingling or numbness. She also states that she has an ingrown toenail which is greasy taken out of the right big toe. She is asking for this area to be trimmed as a starting to grow back somewhat. She denies any redness or drainage. No swelling. No other complaints at this time.    Review of Systems  All other systems reviewed and are negative.      Objective:   Physical Exam General: AAO x3, NAD  Dermatological: Skin is warm, dry and supple bilateral.  There is evidence of prior partial nail avulsions the right lateral nail border how there is slight incurvation on the proximal nail border. There is mild tailors palpation overlying this area. There is no edema, erythema, drainage/purulence. Hyperkeratotic lesion right foot submetatarsal one. Upon debridement no underlying ulceration, drainage or other signs of infection. There are no open sores, no preulcerative lesions, no rash or signs of infection present.  Vascular: Dorsalis Pedis artery and Posterior Tibial artery pedal pulses are 2/4 bilateral with immedate capillary fill time. Pedal hair growth present. No varicosities and no lower extremity edema present bilateral. There is no pain with calf compression, swelling, warmth, erythema.   Neruologic: Grossly intact via light touch bilateral. Vibratory intact via tuning fork bilateral. Protective threshold with Semmes Wienstein monofilament intact to all pedal sites bilateral.  Patellar and Achilles deep tendon reflexes 2+ bilateral. No Babinski or clonus noted bilateral.   Musculoskeletal: No gross boney pedal deformities bilateral.  There is no tenderness palpation along the course/insertion of the plantar fascia or the Achilles tendon although subjectively this is where she does get tenderness if she does not wear the orthotics.No pain, crepitus, or limitation noted with foot and ankle range of motion bilateral. Muscular strength 5/5 in all groups tested bilateral.  Gait: Unassisted, Nonantalgic.       Assessment & Plan:   59 year old female presents for new orthotics to the plantar fasciitis/tendinitis,  Ingrown toenail,  Hyperkeratotic lesion -Treatment options discussed including all alternatives, risks, and complications -Hyperkeratotic lesion debrided 1 without complication/bleeding -Right hallux toenails debrided treatment with a small portion of ingrown toenail. A small amount of bleeding occurred area was cleaned. Recommended Neosporin and a Band-Aid daily as well as Epson salt soaks. If not healed within 2 weeks or any signs or symptoms of infection to call the office. -She was scanned for orthotics were sent to [facility] labs. -Follow-up in 3 weeks to pick up orthotics or sooner if any problems arise. In the meantime, encouraged to call the office with any questions, concerns, change in symptoms.   Celesta Gentile, DPM

## 2015-06-02 ENCOUNTER — Other Ambulatory Visit: Payer: Self-pay

## 2015-06-02 DIAGNOSIS — Z1231 Encounter for screening mammogram for malignant neoplasm of breast: Secondary | ICD-10-CM

## 2015-06-16 ENCOUNTER — Ambulatory Visit: Payer: 59 | Admitting: *Deleted

## 2015-06-16 DIAGNOSIS — M722 Plantar fascial fibromatosis: Secondary | ICD-10-CM

## 2015-06-16 NOTE — Patient Instructions (Signed)

## 2015-06-16 NOTE — Progress Notes (Addendum)
Patient ID: Kathy Mccormick, female   DOB: 1955/07/08, 59 y.o.   MRN: [id number] Patient presents for orthotic pick up.  Verbal and written break in and wear instructions given.  Patient will follow up in 4 weeks if symptoms worsen or fail to improve. Orthotics are not right per patient.  Multiple pictures were taken to send to manufacturer to make corrections.

## 2015-07-14 ENCOUNTER — Ambulatory Visit: Payer: 59

## 2015-07-14 ENCOUNTER — Ambulatory Visit (INDEPENDENT_AMBULATORY_CARE_PROVIDER_SITE_OTHER): Payer: 59 | Admitting: Gynecology

## 2015-07-14 ENCOUNTER — Ambulatory Visit
Admission: RE | Admit: 2015-07-14 | Discharge: 2015-07-14 | Disposition: A | Discharge status: Home / Self Care | Payer: 59 | Source: Ambulatory Visit

## 2015-07-14 ENCOUNTER — Encounter: Payer: Self-pay | Admitting: Gynecology

## 2015-07-14 ENCOUNTER — Telehealth: Payer: Self-pay | Admitting: *Deleted

## 2015-07-14 VITALS — BP 110/70 | Ht 65.0 in | Wt 167.0 lb

## 2015-07-14 DIAGNOSIS — Z01419 Encounter for gynecological examination (general) (routine) without abnormal findings: Secondary | ICD-10-CM

## 2015-07-14 DIAGNOSIS — Z1329 Encounter for screening for other suspected endocrine disorder: Secondary | ICD-10-CM | POA: Diagnosis not present

## 2015-07-14 DIAGNOSIS — Z1321 Encounter for screening for nutritional disorder: Secondary | ICD-10-CM

## 2015-07-14 DIAGNOSIS — N952 Postmenopausal atrophic vaginitis: Secondary | ICD-10-CM

## 2015-07-14 DIAGNOSIS — Z1231 Encounter for screening mammogram for malignant neoplasm of breast: Secondary | ICD-10-CM

## 2015-07-14 DIAGNOSIS — Z13220 Encounter for screening for lipoid disorders: Secondary | ICD-10-CM

## 2015-07-14 DIAGNOSIS — D179 Benign lipomatous neoplasm, unspecified: Secondary | ICD-10-CM

## 2015-07-14 MED ORDER — NONFORMULARY OR COMPOUNDED ITEM: Status: DC

## 2015-07-14 MED ORDER — ACYCLOVIR 400 MG PO TABS: 400.0000 mg | ORAL_TABLET | Freq: Every day | ORAL | Status: DC

## 2015-07-14 NOTE — Addendum Note (Signed)
Addended by: Nelva Nay on: 07/14/2015 09:51 AM   Modules accepted: Orders

## 2015-07-14 NOTE — Telephone Encounter (Signed)
-----   Message from [name], MD sent at 07/14/2015  9:40 AM EST ----- Call patients [facility] to see if they can formulate vaginal estradiol cream [facility] to insert twice weekly. If they can do prefilled syringes great. If not okay. Dispense 3 month supply refill 1 year. If her pharmacy does not then see if you can find one in [place] that can. Left patient know regardless

## 2015-07-14 NOTE — Addendum Note (Signed)
Addended by: Nelva Nay on: 07/14/2015 09:59 AM   Modules accepted: Orders

## 2015-07-14 NOTE — Telephone Encounter (Signed)
Rx called in pt aware 

## 2015-07-14 NOTE — Patient Instructions (Addendum)
Office will call you to arrange for the vaginal estrogen cream. Call the office if you do not hear within the next 2 weeks.  You may obtain a copy of any labs that were done today by logging onto MyChart as outlined in the instructions provided with your AVS (after visit summary). The office will not call with normal lab results but certainly if there are any significant abnormalities then we will contact you.   Health Maintenance Adopting a healthy lifestyle and getting preventive care can go a long way to promote health and wellness. Talk with your health care provider about what schedule of regular examinations is right for you. This is a good chance for you to check in with your provider about disease prevention and staying healthy. In between checkups, there are plenty of things you can do on your own. Experts have done a lot of research about which lifestyle changes and preventive measures are most likely to keep you healthy. Ask your health care provider for more information. WEIGHT AND DIET  Eat a healthy diet  Be sure to include plenty of vegetables, fruits, low-fat dairy products, and lean protein.  Do not eat a lot of foods high in solid fats, added sugars, or salt.  Get regular exercise. This is one of the most important things you can do for your health.  Most adults should exercise for at least 150 minutes each week. The exercise should increase your heart rate and make you sweat (moderate-intensity exercise).  Most adults should also do strengthening exercises at least twice a week. This is in addition to the moderate-intensity exercise.  Maintain a healthy weight  Body mass index (BMI) is a measurement that can be used to identify possible weight problems. It estimates body fat based on height and weight. Your health care provider can help determine your BMI and help you achieve or maintain a healthy weight.  For females 41 years of age and older:   A BMI below 18.5 is  considered underweight.  A BMI of 18.5 to 24.9 is normal.  A BMI of 25 to 29.9 is considered overweight.  A BMI of 30 and above is considered obese.  Watch levels of cholesterol and blood lipids  You should start having your blood tested for lipids and cholesterol at 58 years of age, then have this test every 5 years.  You may need to have your cholesterol levels checked more often if:  Your lipid or cholesterol levels are high.  You are older than 58 years of age.  You are at high risk for heart disease.  CANCER SCREENING   Lung Cancer  Lung cancer screening is recommended for adults 70-25 years old who are at high risk for lung cancer because of a history of smoking.  A yearly low-dose CT scan of the lungs is recommended for people who:  Currently smoke.  Have quit within the past 15 years.  Have at least a 30-pack-year history of smoking. A pack year is smoking an average of one pack of cigarettes a day for 1 year.  Yearly screening should continue until it has been 15 years since you quit.  Yearly screening should stop if you develop a health problem that would prevent you from having lung cancer treatment.  Breast Cancer  Practice breast self-awareness. This means understanding how your breasts normally appear and feel.  It also means doing regular breast self-exams. Let your health care provider know about any changes, no matter how  small.  If you are in your 20s or 30s, you should have a clinical breast exam (CBE) by a health care provider every 1-3 years as part of a regular health exam.  If you are 40 or older, have a CBE every year. Also consider having a breast X-ray (mammogram) every year.  If you have a family history of breast cancer, talk to your health care provider about genetic screening.  If you are at high risk for breast cancer, talk to your health care provider about having an MRI and a mammogram every year.  Breast cancer gene (BRCA)  assessment is recommended for women who have family members with BRCA-related cancers. BRCA-related cancers include:  Breast.  Ovarian.  Tubal.  Peritoneal cancers.  Results of the assessment will determine the need for genetic counseling and BRCA1 and BRCA2 testing. Cervical Cancer Routine pelvic examinations to screen for cervical cancer are no longer recommended for nonpregnant women who are considered low risk for cancer of the pelvic organs (ovaries, uterus, and vagina) and who do not have symptoms. A pelvic examination may be necessary if you have symptoms including those associated with pelvic infections. Ask your health care provider if a screening pelvic exam is right for you.   The Pap test is the screening test for cervical cancer for women who are considered at risk.  If you had a hysterectomy for a problem that was not cancer or a condition that could lead to cancer, then you no longer need Pap tests.  If you are older than 65 years, and you have had normal Pap tests for the past 10 years, you no longer need to have Pap tests.  If you have had past treatment for cervical cancer or a condition that could lead to cancer, you need Pap tests and screening for cancer for at least 20 years after your treatment.  If you no longer get a Pap test, assess your risk factors if they change (such as having a new sexual partner). This can affect whether you should start being screened again.  Some women have medical problems that increase their chance of getting cervical cancer. If this is the case for you, your health care provider may recommend more frequent screening and Pap tests.  The human papillomavirus (HPV) test is another test that may be used for cervical cancer screening. The HPV test looks for the virus that can cause cell changes in the cervix. The cells collected during the Pap test can be tested for HPV.  The HPV test can be used to screen women 62 years of age and older.  Getting tested for HPV can extend the interval between normal Pap tests from three to five years.  An HPV test also should be used to screen women of any age who have unclear Pap test results.  After 58 years of age, women should have HPV testing as often as Pap tests.  Colorectal Cancer  This type of cancer can be detected and often prevented.  Routine colorectal cancer screening usually begins at 58 years of age and continues through 58 years of age.  Your health care provider may recommend screening at an earlier age if you have risk factors for colon cancer.  Your health care provider may also recommend using home test kits to check for hidden blood in the stool.  A small camera at the end of a tube can be used to examine your colon directly (sigmoidoscopy or colonoscopy). This is done to  to check for the earliest forms of colorectal cancer.  Routine screening usually begins at age 50.  Direct examination of the colon should be repeated every 5-10 years through 58 years of age. However, you may need to be screened more often if early forms of precancerous polyps or small growths are found. Skin Cancer  Check your skin from head to toe regularly.  Tell your health care provider about any new moles or changes in moles, especially if there is a change in a mole's shape or color.  Also tell your health care provider if you have a mole that is larger than the size of a pencil eraser.  Always use sunscreen. Apply sunscreen liberally and repeatedly throughout the day.  Protect yourself by wearing long sleeves, pants, a wide-brimmed hat, and sunglasses whenever you are outside. HEART DISEASE, DIABETES, AND HIGH BLOOD PRESSURE   Have your blood pressure checked at least every 1-2 years. High blood pressure causes heart disease and increases the risk of stroke.  If you are between 55 years and 79 years old, ask your health care provider if you should take aspirin to prevent  strokes.  Have regular diabetes screenings. This involves taking a blood sample to check your fasting blood sugar level.  If you are at a normal weight and have a low risk for diabetes, have this test once every three years after 58 years of age.  If you are overweight and have a high risk for diabetes, consider being tested at a younger age or more often. PREVENTING INFECTION  Hepatitis B  If you have a higher risk for hepatitis B, you should be screened for this virus. You are considered at high risk for hepatitis B if:  You were born in a country where hepatitis B is common. Ask your health care provider which countries are considered high risk.  Your parents were born in a high-risk country, and you have not been immunized against hepatitis B (hepatitis B vaccine).  You have HIV or AIDS.  You use needles to inject street drugs.  You live with someone who has hepatitis B.  You have had sex with someone who has hepatitis B.  You get hemodialysis treatment.  You take certain medicines for conditions, including cancer, organ transplantation, and autoimmune conditions. Hepatitis C  Blood testing is recommended for:  Everyone born from 1945 through 1965.  Anyone with known risk factors for hepatitis C. Sexually transmitted infections (STIs)  You should be screened for sexually transmitted infections (STIs) including gonorrhea and chlamydia if:  You are sexually active and are younger than 58 years of age.  You are older than 58 years of age and your health care provider tells you that you are at risk for this type of infection.  Your sexual activity has changed since you were last screened and you are at an increased risk for chlamydia or gonorrhea. Ask your health care provider if you are at risk.  If you do not have HIV, but are at risk, it may be recommended that you take a prescription medicine daily to prevent HIV infection. This is called pre-exposure prophylaxis  (PrEP). You are considered at risk if:  You are sexually active and do not regularly use condoms or know the HIV status of your partner(s).  You take drugs by injection.  You are sexually active with a partner who has HIV. Talk with your health care provider about whether you are at high risk of being infected with   If you choose to begin PrEP, you should first be tested for HIV. You should then be tested every 3 months for as long as you are taking PrEP.  PREGNANCY   If you are premenopausal and you may become pregnant, ask your health care provider about preconception counseling.  If you may become pregnant, take 400 to 800 micrograms (mcg) of folic acid every day.  If you want to prevent pregnancy, talk to your health care provider about birth control (contraception). OSTEOPOROSIS AND MENOPAUSE   Osteoporosis is a disease in which the bones lose minerals and strength with aging. This can result in serious bone fractures. Your risk for osteoporosis can be identified using a bone density scan.  If you are 65 years of age or older, or if you are at risk for osteoporosis and fractures, ask your health care provider if you should be screened.  Ask your health care provider whether you should take a calcium or vitamin D supplement to lower your risk for osteoporosis.  Menopause may have certain physical symptoms and risks.  Hormone replacement therapy may reduce some of these symptoms and risks. Talk to your health care provider about whether hormone replacement therapy is right for you.  HOME CARE INSTRUCTIONS   Schedule regular health, dental, and eye exams.  Stay current with your immunizations.   Do not use any tobacco products including cigarettes, chewing tobacco, or electronic cigarettes.  If you are pregnant, do not drink alcohol.  If you are breastfeeding, limit how much and how often you drink alcohol.  Limit alcohol intake to no more than 1 drink per day for  nonpregnant women. One drink equals 12 ounces of beer, 5 ounces of wine, or 1 ounces of hard liquor.  Do not use street drugs.  Do not share needles.  Ask your health care provider for help if you need support or information about quitting drugs.  Tell your health care provider if you often feel depressed.  Tell your health care provider if you have ever been abused or do not feel safe at home. Document Released: 01/01/2011 Document Revised: 11/02/2013 Document Reviewed: 05/20/2013 [facility] Patient Information 2015 [place], [place]. This information is not intended to replace advice given to you by your health care provider. Make sure you discuss any questions you have with your health care provider.

## 2015-07-14 NOTE — Progress Notes (Signed)
ERIE COORS 04/08/1956 [id number]        60 y.o.  G1P0010  for annual exam.  Doing well. Several issues noted below  Past medical history,surgical history, problem list, medications, allergies, family history and social history were all reviewed and documented as reviewed in the EPIC chart.  ROS:  Performed with pertinent positives and negatives included in the history, assessment and plan.   Additional significant findings :  none   Exam: [name] assistant Filed Vitals:   07/14/15 0911  BP: 110/70  Height: 5\' 5"  (1.651 m)  Weight: 167 lb (75.751 kg)   General appearance:  Normal affect, orientation and appearance. Skin: Grossly normal excepting 3 cm classic lipoma left posterior shoulder HEENT: Without gross lesions.  No cervical or supraclavicular adenopathy. Thyroid normal.  Lungs:  Clear without wheezing, rales or rhonchi Cardiac: RR, without RMG Abdominal:  Soft, nontender, without masses, guarding, rebound, organomegaly or hernia Breasts:  Examined lying and sitting without masses, retractions, discharge or axillary adenopathy. Pelvic:  Ext/BUS/vagina with atrophic changes  Adnexa  Without masses or tenderness    Anus and perineum  Normal   Rectovaginal  Normal sphincter tone without palpated masses or tenderness.    Assessment/Plan:  60 y.o. G1P0010 female for annual exam.   1. Postmenopausal/atrophic genital changes.  Status post TVH with subsequent RSO and LSO for endometriosis.  Had been using Estrace cream but not consistently. Complaints of superficial insertional dyspareunia to the point now where she is not having intercourse. Not having significant hot flashes or night sweats. Reviewed options to include lubricants, vaginal estrogen, Osphena. After lengthy discussion about the advantages and risks of all choices she wants to go ahead with formulated vaginal estradiol cream twice weekly. We'll arrange to her [employer]. Risks of absorption with systemic  effects reviewed to include possible increased risk of thrombosis such as stroke heart attack DVT and breast cancer risks. Patient understands and accepts and will follow up if she continues to have issues after initiating. 2. Lipoma left upper shoulder. Stable over serial exams. Asymptomatic to her. She had she saw a surgeon historically and decided against excision. She'll continue to monitor as long as it remains stable and is not bothersome to her she'll monitor. 3. 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 Refill 1 year provided. 4. Mammography today. Continue with annual mammography when due. SBE monthly reviewed. 5. DEXA 2013 normal. We'll plan repeat five-year interval. Check vitamin D level today. 6. Colonoscopy 2014. Repeat at their recommended interval. 7. Pap smear 2015. No Pap smear done today. 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 Options to stop screening altogether versus less frequent screening intervals given hysterectomy for benign indications reviewed. Will readdress on an annual basis. 8. Health maintenance. Patient requests baseline labs.  She does have a primary physician. CBC, comprehensive metabolic panel, lipid profile, vitamin D, TSH and urinalysis ordered. Follow up in one year, sooner if any issues once initiating the estradiol vaginal cream.   [name] MD, 9:43 AM 07/14/2015

## 2015-07-19 ENCOUNTER — Telehealth: Payer: Self-pay | Admitting: Gynecology

## 2015-07-19 NOTE — Telephone Encounter (Signed)
Tell patient that her vitamin D level is at the lower range of normal. I would recommend supplementing with 1000 units OTC vitamin D daily

## 2015-07-19 NOTE — Telephone Encounter (Signed)
Pt informed with the below note. 

## 2015-07-22 ENCOUNTER — Ambulatory Visit (INDEPENDENT_AMBULATORY_CARE_PROVIDER_SITE_OTHER): Payer: 59 | Admitting: *Deleted

## 2015-07-22 DIAGNOSIS — M722 Plantar fascial fibromatosis: Secondary | ICD-10-CM

## 2015-07-22 NOTE — Progress Notes (Signed)
Patient presents today for rescanning of orthotics. Patient has had 2 orthotics made by [facility] and she has not been satisfied with them. She is currently wearing a pair of [employer] orthotics that she says is very comfortable, but is extremely worn. I am rescanning her today and sending back both pairs of orthotics to [place] for either a remake or adjustment. Sending in a new order with in their system for documentation of the changes needing to be made.

## 2015-07-28 ENCOUNTER — Ambulatory Visit: Payer: 59

## 2015-08-31 ENCOUNTER — Ambulatory Visit (INDEPENDENT_AMBULATORY_CARE_PROVIDER_SITE_OTHER): Payer: 59 | Admitting: Podiatry

## 2015-08-31 DIAGNOSIS — M722 Plantar fascial fibromatosis: Secondary | ICD-10-CM

## 2015-08-31 NOTE — Progress Notes (Signed)
Recasted for orthotics-this order will be sent to [facility]. Patient has tried multiple [employer] and was dissatisfied.

## 2015-09-16 ENCOUNTER — Encounter: Payer: Self-pay | Admitting: Podiatry

## 2015-10-27 ENCOUNTER — Telehealth: Payer: Self-pay | Admitting: *Deleted

## 2015-10-27 NOTE — Telephone Encounter (Signed)
Called patient today and stated that patient's orthotics were here and that she could come by the [place] office today and pick them up. The inserts are made from [facility] and the instructions were given. Lattie Haw

## 2016-01-12 ENCOUNTER — Ambulatory Visit (INDEPENDENT_AMBULATORY_CARE_PROVIDER_SITE_OTHER): Payer: 59 | Admitting: Podiatry

## 2016-01-12 ENCOUNTER — Encounter: Payer: Self-pay | Admitting: Podiatry

## 2016-01-12 VITALS — BP 117/76 | HR 76 | Resp 12

## 2016-01-12 DIAGNOSIS — L600 Ingrowing nail: Secondary | ICD-10-CM | POA: Diagnosis not present

## 2016-01-12 NOTE — Patient Instructions (Signed)

## 2016-01-15 DIAGNOSIS — L600 Ingrowing nail: Secondary | ICD-10-CM | POA: Insufficient documentation

## 2016-01-15 NOTE — Progress Notes (Signed)
Patient ID: EVERLY BURDICK, female   DOB: 1956-02-08, 60 y.o.   MRN: [id number]  Subjective: 60 year old presents the office today for recurrence of right lateral hallux ingrown toenail. This time she is requesting partial nail avulsion with chemical matricectomy to prevent this from coming back again. There is painful to pressure in shoes. Denies any drainage or pus. The area does get swelling at times.Denies any systemic complaints such as fevers, chills, nausea, vomiting. No acute changes since last appointment, and no other complaints at this time.   Objective: AAO x3, NAD DP/PT pulses palpable bilaterally 2/4, CRT less than 3 seconds Ingrown toenails the right lateral hallux toenail. There is tenderness palpation of this area is localized edema without any erythema or increase in warmth. No drainage or pus.n warmth to bilateral lower extremities.  No open lesions or pre-ulcerative lesions.  No pain with calf compression, swelling, warmth, erythema  Assessment: Right lateral hallux ingrown toenail, symptomatic   Plan: -All treatment options discussed with the patient including all alternatives, risks, complications.  -At this time, the patient is requesting partial nail removal with chemical matricectomy to the symptomatic portion of the nail. Risks and complications were discussed with the patient for which they understand and  verbally consent to the procedure. Under sterile conditions a total of 3 mL of a mixture of 2% lidocaine plain and 0.5% Marcaine plain was infiltrated in a hallux block fashion. Once anesthetized, the skin was prepped in sterile fashion. A tourniquet was then applied. Next the lateral aspect of hallux nail border was then sharply excised making sure to remove the entire offending nail border. Once the nails were ensured to be removed area was debrided and the underlying skin was intact. There is no purulence identified in the procedure. Next phenol was then applied under  standard conditions and copiously irrigated. Silvadene was applied. A dry sterile dressing was applied. After application of the dressing the tourniquet was removed and there is found to be an immediate capillary refill time to the digit. The patient tolerated the procedure well any complications. Post procedure instructions were discussed the patient for which he verbally understood. Follow-up in one week for nail check or sooner if any problems are to arise. Discussed signs/symptoms of infection and directed to call the office immediately should any occur or go directly to the emergency room. In the meantime, encouraged to call the office with any questions, concerns, changes symptoms.  Celesta Gentile, DPM

## 2016-01-24 ENCOUNTER — Encounter: Payer: Self-pay | Admitting: Podiatry

## 2016-01-24 ENCOUNTER — Ambulatory Visit (INDEPENDENT_AMBULATORY_CARE_PROVIDER_SITE_OTHER): Payer: 59 | Admitting: Podiatry

## 2016-01-24 DIAGNOSIS — L600 Ingrowing nail: Secondary | ICD-10-CM

## 2016-01-24 NOTE — Progress Notes (Signed)
Subjective: Kathy Mccormick is a 60 y.o.  female returns to office today for follow up evaluation after having right lateral Hallux partial nail avulsion performed. Patient has been soaking using epsom salts and applying topical antibiotic covered with bandaid daily. Patient denies fevers, chills, nausea, vomiting. Denies any calf pain, chest pain, SOB. She also brings in her orthotics as she states they are "to high" and need to be adjusted.  Objective:  Vitals: Reviewed  General: Well developed, nourished, in no acute distress, alert and oriented x3   Dermatology: Skin is warm, dry and supple bilateral. Right lateral hallux nail border appears to be clean, dry, with mild granular tissue and surrounding scab. There is no surrounding erythema, edema, drainage/purulence. The remaining nails appear unremarkable at this time. There are no other lesions or other signs of infection present.  Neurovascular status: Intact. No lower extremity swelling; No pain with calf compression bilateral.  Musculoskeletal: Notenderness to palpation of the lateral hallux nail fold. Muscular strength within normal limits bilateral.   Assesement and Plan: S/p partial nail avulsion, doing well.   -Continue soaking in epsom salts twice a day followed by antibiotic ointment and a band-aid. Can leave uncovered at night. Continue this until completely healed.  -If the area has not healed in 2 weeks, call the office for follow-up appointment, or sooner if any problems arise.  -Her orthotics are very high in the heel compared to her previous one. Will send back to [facility] with a picture of her old inserts.  -Monitor for any signs/symptoms of infection. Call the office immediately if any occur or go directly to the emergency room. Call with any questions/concerns.  Celesta Gentile, DPM

## 2016-02-15 ENCOUNTER — Encounter: Payer: Self-pay | Admitting: *Deleted

## 2016-03-01 ENCOUNTER — Ambulatory Visit
Admission: EM | Admit: 2016-03-01 | Discharge: 2016-03-01 | Disposition: A | Discharge status: Home / Self Care | Payer: 59 | Attending: Family Medicine | Admitting: Family Medicine

## 2016-03-01 ENCOUNTER — Encounter: Payer: Self-pay | Admitting: *Deleted

## 2016-03-01 ENCOUNTER — Ambulatory Visit (INDEPENDENT_AMBULATORY_CARE_PROVIDER_SITE_OTHER): Payer: 59

## 2016-03-01 DIAGNOSIS — M5431 Sciatica, right side: Secondary | ICD-10-CM | POA: Diagnosis not present

## 2016-03-01 DIAGNOSIS — M461 Sacroiliitis, not elsewhere classified: Secondary | ICD-10-CM | POA: Diagnosis not present

## 2016-03-01 MED ORDER — KETOROLAC TROMETHAMINE 60 MG/2ML IM SOLN
60.0000 mg | Freq: Once | INTRAMUSCULAR | Status: AC
  Administered 2016-03-01: 60 mg via INTRAMUSCULAR

## 2016-03-01 MED ORDER — PREDNISONE 10 MG (21) PO TBPK: ORAL_TABLET | ORAL | 0 refills | Status: DC

## 2016-03-01 MED ORDER — ORPHENADRINE CITRATE ER 100 MG PO TB12: 100.0000 mg | ORAL_TABLET | Freq: Two times a day (BID) | ORAL | 0 refills | Status: DC

## 2016-03-01 MED ORDER — MELOXICAM 15 MG PO TABS: 15.0000 mg | ORAL_TABLET | Freq: Every day | ORAL | 0 refills | Status: DC

## 2016-03-01 NOTE — ED Provider Notes (Signed)
[facility]    CSN: [id number] Arrival date & time: 03/01/16  1128  First Provider Contact:  None       History   Chief Complaint Chief Complaint  Patient presents with  . Hip Pain    HPI Kathy Mccormick is a 60 y.o. female.   Patient reports Tuesday she was at work when she twisted starts feeling a sharp pain down the right side of her lower back. She saw Dr. Oswaldo Milian yesterday and had cryotherapy, tens unit, acupuncture dry needle, and adjustment ball had minimal amount of relief. 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 Evaluation gets relief is lying on her left side with knees bent and a pillow between her legs. She answers history of endometriosis CIN-1 migraines and neck pain. She is not allergic to any medications and no pertinent family medical history pertaining to today's visit. She is a former smoker.   The history is provided by the patient. No language interpreter was used.  Hip Pain  This is a new problem. The current episode started more than 2 days ago. The problem occurs constantly. Pertinent negatives include no chest pain, no abdominal pain, no headaches and no shortness of breath. The symptoms are aggravated by exertion. Nothing relieves the symptoms. Treatments tried: CHIROPRACTIC CARE. The treatment provided mild relief.    Past Medical History:  Diagnosis Date  . CIN I (cervical intraepithelial neoplasia I)   . Endometriosis   . Migraines   . Neck pain   . STD (sexually transmitted disease)    HSV ll    Patient Active Problem List   Diagnosis Date Noted  . Ingrown toenail 01/15/2016  . Migraines     Past Surgical History:  Procedure Laterality Date  . BACK SURGERY    . CARPAL TUNNEL RELEASE    . CHOLECYSTECTOMY    . COLPOSCOPY    . OOPHORECTOMY     RSO,LSO  . PELVIC LAPAROSCOPY  2001,2004   DL X 2 RSO and LSO  . TONSILLECTOMY    . VAGINAL  HYSTERECTOMY  2000   endometriosis    OB History    Gravida Para Term Preterm AB Living   1       1 0   SAB TAB Ectopic Multiple Live Births                   Home Medications    Prior to Admission medications   Medication Sig Start Date End Date Taking? Authorizing Provider  acyclovir (ZOVIRAX) 400 MG tablet Take 1 tablet (400 mg total) by mouth daily. 07/14/15  Yes [name], MD  CHERRY PO Take by mouth.     Yes Historical Provider, MD  doxycycline (VIBRA-TABS) 100 MG tablet  04/08/15  Yes Historical Provider, MD  DULoxetine (CYMBALTA) 60 MG capsule  03/30/15  Yes Historical Provider, MD  levothyroxine (SYNTHROID, LEVOTHROID) 50 MCG tablet  04/08/15  Yes Historical Provider, MD  losartan (COZAAR) 100 MG tablet Take 100 mg by mouth daily.     Yes Historical Provider, MD  Multiple Vitamin (MULTIVITAMIN) tablet Take 1 tablet by mouth daily.     Yes Historical Provider, MD  omeprazole (PRILOSEC) 40 MG capsule  04/08/15  Yes Historical Provider, MD  zolmitriptan (ZOMIG-ZMT) 5 MG disintegrating tablet  05/14/15  Yes Historical Provider, MD  ALPRAZolam (XANAX) 0.25 MG tablet Take 0.25 mg by  mouth at bedtime as needed.      Historical Provider, MD  atorvastatin (LIPITOR) 10 MG tablet  04/08/15   Historical Provider, MD  estradiol (ESTRACE) 0.1 MG/GM vaginal cream i gram in vagina three times a week Patient not taking: Reported on 07/14/2015 06/21/14   [name], MD  meloxicam (MOBIC) 15 MG tablet Take 1 tablet (15 mg total) by mouth daily. 03/01/16   Frederich Cha, MD  nabumetone (RELAFEN) 500 MG tablet  04/08/15   Historical Provider, MD  NONFORMULARY OR COMPOUNDED ITEM Estradiol 0.02 % vaginal cream (1gram twice weekly) 07/14/15   [name], MD  orphenadrine (NORFLEX) 100 MG tablet Take 1 tablet (100 mg total) by mouth 2 (two) times daily. 03/01/16   Frederich Cha, MD  predniSONE (STERAPRED UNI-PAK 21 TAB) 10 MG (21) TBPK tablet Sig 6 tablet day 1, 5 tablets day 2, 4 tablets  day 3,,3tablets day 4, 2 tablets day 5, 1 tablet day 6 take all tablets orally 03/01/16   Frederich Cha, MD    Family History Family History  Problem Relation Age of Onset  . Hypertension Father   . Diabetes Mother   . Hypertension Mother   . Uterine cancer Mother   . Liver disease Mother   . Hypertension Brother   . Breast cancer Maternal Aunt     Age 16's  . Aneurysm Maternal Aunt     Social History Social History  Substance Use Topics  . Smoking status: Former Research scientist (life sciences)  . Smokeless tobacco: Never Used  . Alcohol use No     Allergies   Codeine   Review of Systems Review of Systems  Respiratory: Negative for shortness of breath.   Cardiovascular: Negative for chest pain.  Gastrointestinal: Negative for abdominal pain.  Musculoskeletal: Positive for back pain, gait problem and joint swelling.  Neurological: Negative for headaches.  All other systems reviewed and are negative.    Physical Exam Triage Vital Signs ED Triage Vitals  Enc Vitals Group     BP 03/01/16 1202 (!) 144/68     Pulse Rate 03/01/16 1202 89     Resp 03/01/16 1202 18     Temp 03/01/16 1202 97.7 F (36.5 C)     Temp Source 03/01/16 1202 Oral     SpO2 03/01/16 1202 99 %     Weight 03/01/16 1202 165 lb (74.8 kg)     Height 03/01/16 1202 5\' 5"  (1.651 m)     Head Circumference --      Peak Flow --      Pain Score 03/01/16 1213 9     Pain Loc --      Pain Edu? --      Excl. in [place]? --    No data found.   Updated Vital Signs BP (!) 144/68 (BP Location: Left Arm)   Pulse 89   Temp 97.7 F (36.5 C) (Oral)   Resp 18   Ht 5\' 5"  (1.651 m)   Wt 165 lb (74.8 kg)   SpO2 99%   BMI 27.46 kg/m   Visual Acuity Right Eye Distance:   Left Eye Distance:   Bilateral Distance:    Right Eye Near:   Left Eye Near:    Bilateral Near:     Physical Exam  Constitutional: She is oriented to person, place, and time. She appears well-developed and well-nourished.  HENT:  Head: Normocephalic and  atraumatic.  Right Ear: External ear normal.  Left Ear: External ear normal.  Mouth/Throat: Oropharynx is clear and moist.  Eyes: EOM are normal. Pupils are equal, round, and reactive to light.  Neck: Normal range of motion.  Pulmonary/Chest: Effort normal.  Musculoskeletal: Normal range of motion. She exhibits tenderness.       Lumbar back: She exhibits tenderness, bony tenderness, pain and spasm.  Patient is tenderness of the lower right iliac sacral joint area. Reflexes strength appears be intact.  Neurological: She is alert and oriented to person, place, and time.  Skin: Skin is warm and dry.  Psychiatric: She has a normal mood and affect.  Vitals reviewed.    UC Treatments / Results  Labs (all labs ordered are listed, but only abnormal results are displayed) Labs Reviewed - No data to display  EKG  EKG Interpretation None       Radiology Dg Si Joints  Result Date: 03/01/2016 CLINICAL DATA:  [place] last week, now with right pelvic pain EXAM: BILATERAL SACROILIAC JOINTS - 3+ VIEW COMPARISON:  None. FINDINGS: The SI joints are well corticated. There is no evidence of sacroiliitis. The sacral foramina appear normal. The pelvic rami appear intact. There are degenerative changes in the lower lumbar spine. IMPRESSION: 1. The SI joints appear well corticated with no evidence of sacroiliitis. 2. Some degenerative change is present in the lower lumbar spine. Electronically Signed   By: Ivar Drape M.D.   On: 03/01/2016 13:05    Procedures Procedures (including critical care time)  Medications Ordered in UC Medications  ketorolac (TORADOL) injection 60 mg (60 mg Intramuscular Given 03/01/16 1243)     Initial Impression / Assessment and Plan / UC Course  I have reviewed the triage vital signs and the nursing notes.  Pertinent labs & imaging results that were available during my care of the patient were reviewed by me and considered in my medical decision making (see chart for  details).  Clinical Course   She apparentlly has iliosacralitis on the right side. Explained to her she may need to have injection done by PCP later. We'll x-ray the right iliac sacral area. When given 60 Toradol IM now place her on a prednisone taper Mobic 15 mg and Norflex 100 mg. We will for today and tomorrow she wanted know if I can keep out Tuesday but that we can't because he did not do FMLA if she is not better by Tuesday she'll need to follow-up Dr. Oswaldo Milian.  Final Clinical Impressions(s) / UC Diagnoses   Final diagnoses:  Sacroiliitis ([place])  Sciatica of right side    New Prescriptions New Prescriptions   MELOXICAM (MOBIC) 15 MG TABLET    Take 1 tablet (15 mg total) by mouth daily.   ORPHENADRINE (NORFLEX) 100 MG TABLET    Take 1 tablet (100 mg total) by mouth 2 (two) times daily.   PREDNISONE (STERAPRED UNI-PAK 21 TAB) 10 MG (21) TBPK TABLET    Sig 6 tablet day 1, 5 tablets day 2, 4 tablets day 3,,3tablets day 4, 2 tablets day 5, 1 tablet day 6 take all tablets orally     Frederich Cha, MD 03/01/16 1323

## 2016-03-01 NOTE — ED Triage Notes (Signed)
Patient started having symptoms of right hip pain 3 days ago. Additional symptom of radiating pain shooting down right leg started 2 days ago. Patient has come to [facility] as per direction from her chiropractor.

## 2016-04-06 ENCOUNTER — Emergency Department
Admission: EM | Admit: 2016-04-06 | Discharge: 2016-04-06 | Disposition: A | Discharge status: Home / Self Care | Payer: 59 | Attending: Emergency Medicine | Admitting: Emergency Medicine

## 2016-04-06 DIAGNOSIS — Z87891 Personal history of nicotine dependence: Secondary | ICD-10-CM | POA: Insufficient documentation

## 2016-04-06 DIAGNOSIS — R748 Abnormal levels of other serum enzymes: Secondary | ICD-10-CM

## 2016-04-06 DIAGNOSIS — R112 Nausea with vomiting, unspecified: Secondary | ICD-10-CM

## 2016-04-06 DIAGNOSIS — R197 Diarrhea, unspecified: Secondary | ICD-10-CM | POA: Diagnosis present

## 2016-04-06 LAB — COMPREHENSIVE METABOLIC PANEL
ALBUMIN: 4.6 g/dL (ref 3.5–5.0)
ALK PHOS: 82 U/L (ref 38–126)
ALT: 21 U/L (ref 14–54)
ANION GAP: 10 (ref 5–15)
AST: 27 U/L (ref 15–41)
BILIRUBIN TOTAL: 1 mg/dL (ref 0.3–1.2)
BUN: 21 mg/dL — AB (ref 6–20)
CALCIUM: 9.2 mg/dL (ref 8.9–10.3)
CO2: 25 mmol/L (ref 22–32)
Chloride: 106 mmol/L (ref 101–111)
Creatinine, Ser: 0.74 mg/dL (ref 0.44–1.00)
GFR calc Af Amer: >60 mL/min (ref >60)
GFR calc non Af Amer: >60 mL/min (ref >60)
GLUCOSE: 110 mg/dL — AB (ref 65–99)
Potassium: 3.6 mmol/L (ref 3.5–5.1)
SODIUM: 141 mmol/L (ref 135–145)
Total Protein: 7.5 g/dL (ref 6.5–8.1)

## 2016-04-06 LAB — URINALYSIS COMPLETE WITH MICROSCOPIC (ARMC ONLY)
BACTERIA UA: NONE SEEN
Bilirubin Urine: NEGATIVE
GLUCOSE, UA: NEGATIVE mg/dL
Hgb urine dipstick: NEGATIVE
Ketones, ur: NEGATIVE mg/dL
Leukocytes, UA: NEGATIVE
Nitrite: NEGATIVE
PROTEIN: NEGATIVE mg/dL
Specific Gravity, Urine: 1.019 (ref 1.005–1.030)
pH: 5 (ref 5.0–8.0)

## 2016-04-06 LAB — CBC
HEMATOCRIT: 45.9 % (ref 35.0–47.0)
HEMOGLOBIN: 15.8 g/dL (ref 12.0–16.0)
MCH: 31.2 pg (ref 26.0–34.0)
MCHC: 34.5 g/dL (ref 32.0–36.0)
MCV: 90.5 fL (ref 80.0–100.0)
Platelets: 331 10*3/uL (ref 150–440)
RBC: 5.07 MIL/uL (ref 3.80–5.20)
RDW: 13.2 % (ref 11.5–14.5)
WBC: 12.5 10*3/uL — ABNORMAL HIGH (ref 3.6–11.0)

## 2016-04-06 LAB — LIPASE, BLOOD: Lipase: 100 U/L — ABNORMAL HIGH (ref 11–51)

## 2016-04-06 MED ORDER — ONDANSETRON HCL 4 MG/2ML IJ SOLN
INTRAMUSCULAR | Status: AC
  Administered 2016-04-06: 4 mg via INTRAVENOUS
  Filled 2016-04-06: qty 2

## 2016-04-06 MED ORDER — SODIUM CHLORIDE 0.9 % IV SOLN
Freq: Once | INTRAVENOUS | Status: AC
  Administered 2016-04-06: 10:00:00 via INTRAVENOUS

## 2016-04-06 MED ORDER — SODIUM CHLORIDE 0.9 % IV BOLUS (SEPSIS)
1000.0000 mL | Freq: Once | INTRAVENOUS | Status: AC
  Administered 2016-04-06: 1000 mL via INTRAVENOUS

## 2016-04-06 MED ORDER — ONDANSETRON HCL 4 MG/2ML IJ SOLN
4.0000 mg | Freq: Once | INTRAMUSCULAR | Status: AC | PRN
  Administered 2016-04-06: 4 mg via INTRAVENOUS

## 2016-04-06 MED ORDER — ONDANSETRON HCL 4 MG PO TABS: 4.0000 mg | ORAL_TABLET | Freq: Three times a day (TID) | ORAL | 0 refills | Status: DC | PRN

## 2016-04-06 NOTE — Discharge Instructions (Signed)
Return to the emergency room for any new or worrisome symptoms including increased abdominal pain, bleeding, black stool, vomiting that does not stop despite medication, fever, lethargy or if you feel worse in any way. Follow closely with your  primary care doctor and do make them aware the lipase was slightly elevated today and may need to be rechecked or  followed up on.

## 2016-04-06 NOTE — ED Provider Notes (Addendum)
[facility] Emergency Department Provider Note  ____________________________________________   I have reviewed the triage vital signs and the nursing notes.   HISTORY  Chief Complaint Nausea and Emesis    HPI Kathy Mccormick is a 60 y.o. female who states that she started having nausea vomiting diarrhea approximately 15 hours ago. She denies recent travel, antibiotic use, or camping. She states that she has not had any melena bright red blood per rectum or hematemesis. She has minimal abdominal cramping, diffusely. She denies any fever or chills. She does not drink alcohol on a regular basis. She states that she has positive sick contacts. Patient with 5 episodes of nonbloody nonbilious vomiting today and watery diarrhea "every hour" since this morning however it seems to be abating. She has not had a fever. She has no history of pancreatitis. She denies any headache stiff neck dysuria or urinary frequency. She states at this time she cannot give [place] a stool sample. She did receive IV fluid in triage as well as Zofran is feeling much better already she states.  Past Medical History:  Diagnosis Date  . CIN I (cervical intraepithelial neoplasia I)   . Endometriosis   . Migraines   . Neck pain   . STD (sexually transmitted disease)    HSV ll    Patient Active Problem List   Diagnosis Date Noted  . Ingrown toenail 01/15/2016  . Migraines     Past Surgical History:  Procedure Laterality Date  . BACK SURGERY    . CARPAL TUNNEL RELEASE    . CHOLECYSTECTOMY    . COLPOSCOPY    . OOPHORECTOMY     RSO,LSO  . PELVIC LAPAROSCOPY  2001,2004   DL X 2 RSO and LSO  . TONSILLECTOMY    . VAGINAL HYSTERECTOMY  2000   endometriosis    Prior to Admission medications   Medication Sig Start Date End Date Taking? Authorizing Provider  acyclovir (ZOVIRAX) 400 MG tablet Take 1 tablet (400 mg total) by mouth daily. 07/14/15   [name], MD  ALPRAZolam Duanne Moron) 0.25  MG tablet Take 0.25 mg by mouth at bedtime as needed.      Historical Provider, MD  atorvastatin (LIPITOR) 10 MG tablet  04/08/15   Historical Provider, MD  CHERRY PO Take by mouth.      Historical Provider, MD  doxycycline (VIBRA-TABS) 100 MG tablet  04/08/15   Historical Provider, MD  DULoxetine (CYMBALTA) 60 MG capsule  03/30/15   Historical Provider, MD  estradiol (ESTRACE) 0.1 MG/GM vaginal cream i gram in vagina three times a week Patient not taking: Reported on 07/14/2015 06/21/14   [name], MD  levothyroxine (SYNTHROID, LEVOTHROID) 50 MCG tablet  04/08/15   Historical Provider, MD  losartan (COZAAR) 100 MG tablet Take 100 mg by mouth daily.      Historical Provider, MD  meloxicam (MOBIC) 15 MG tablet Take 1 tablet (15 mg total) by mouth daily. 03/01/16   Frederich Cha, MD  Multiple Vitamin (MULTIVITAMIN) tablet Take 1 tablet by mouth daily.      Historical Provider, MD  nabumetone (RELAFEN) 500 MG tablet  04/08/15   Historical Provider, MD  NONFORMULARY OR COMPOUNDED ITEM Estradiol 0.02 % vaginal cream (1gram twice weekly) 07/14/15   [name], MD  omeprazole (PRILOSEC) 40 MG capsule  04/08/15   Historical Provider, MD  orphenadrine (NORFLEX) 100 MG tablet Take 1 tablet (100 mg total) by mouth 2 (two) times daily. 03/01/16  Frederich Cha, MD  predniSONE (STERAPRED UNI-PAK 21 TAB) 10 MG (21) TBPK tablet Sig 6 tablet day 1, 5 tablets day 2, 4 tablets day 3,,3tablets day 4, 2 tablets day 5, 1 tablet day 6 take all tablets orally 03/01/16   Frederich Cha, MD  zolmitriptan (ZOMIG-ZMT) 5 MG disintegrating tablet  05/14/15   Historical Provider, MD    Allergies Codeine and Penicillins  Family History  Problem Relation Age of Onset  . Hypertension Father   . Diabetes Mother   . Hypertension Mother   . Uterine cancer Mother   . Liver disease Mother   . Hypertension Brother   . Breast cancer Maternal Aunt     Age 59's  . Aneurysm Maternal Aunt     Social History Social History   Substance Use Topics  . Smoking status: Former Research scientist (life sciences)  . Smokeless tobacco: Never Used  . Alcohol use No    Review of Systems Constitutional: No fever/chills Eyes: No visual changes. ENT: No sore throat. No stiff neck no neck pain Cardiovascular: Denies chest pain. Respiratory: Denies shortness of breath. Gastrointestinal:  See history of present illness Genitourinary: Negative for dysuria. Musculoskeletal: Negative lower extremity swelling Skin: Negative for rash. Neurological: Negative for severe headaches, focal weakness or numbness. 10-point ROS otherwise negative.  ____________________________________________   PHYSICAL EXAM:  VITAL SIGNS: ED Triage Vitals [04/06/16 0956]  Enc Vitals Group     BP 112/73     Pulse Rate 97     Resp 17     Temp 97.7 F (36.5 C)     Temp Source Oral     SpO2 99 %     Weight 165 lb (74.8 kg)     Height 5\' 5"  (1.651 m)     Head Circumference      Peak Flow      Pain Score 3     Pain Loc      Pain Edu?      Excl. in [place]?     Constitutional: Alert and oriented. Well appearing and in no acute distress. Eyes: Conjunctivae are normal. PERRL. EOMI. Head: Atraumatic. Nose: No congestion/rhinnorhea. Mouth/Throat: Mucous membranes are moist.  Oropharynx non-erythematous. Neck: No stridor.   Nontender with no meningismus Cardiovascular: Normal rate, regular rhythm. Grossly normal heart sounds.  Good peripheral circulation. Respiratory: Normal respiratory effort.  No retractions. Lungs CTAB. Abdominal: Soft and Very mild diffuse tenderness no guarding no rebound nonsurgical abdomen with no focality. No distention. No guarding no rebound Back:  There is no focal tenderness or step off.  there is no midline tenderness there are no lesions noted. there is no CVA tenderness Musculoskeletal: No lower extremity tenderness, no upper extremity tenderness. No joint effusions, no DVT signs strong distal pulses no edema Neurologic:  Normal speech and  language. No gross focal neurologic deficits are appreciated.  Skin:  Skin is warm, dry and intact. No rash noted. Psychiatric: Mood and affect are normal. Speech and behavior are normal.  ____________________________________________   LABS (all labs ordered are listed, but only abnormal results are displayed)  Labs Reviewed  LIPASE, BLOOD - Abnormal; Notable for the following:       Result Value   Lipase 100 (*)    All other components within normal limits  COMPREHENSIVE METABOLIC PANEL - Abnormal; Notable for the following:    Glucose, Bld 110 (*)    BUN 21 (*)    All other components within normal limits  CBC - Abnormal; Notable for the  following:    WBC 12.5 (*)    All other components within normal limits  URINALYSIS COMPLETEWITH MICROSCOPIC ([facility] ONLY)   ____________________________________________  EKG  I personally interpreted any EKGs ordered by me or triage  ____________________________________________  RADIOLOGY  I reviewed any imaging ordered by me or triage that were performed during my shift and, if possible, patient and/or family made aware of any abnormal findings. ____________________________________________   PROCEDURES  Procedure(s) performed: None  Procedures  Critical Care performed: None  ____________________________________________   INITIAL IMPRESSION / ASSESSMENT AND PLAN / ED COURSE  Pertinent labs & imaging results that were available during my care of the patient were reviewed by me and considered in my medical decision making (see chart for details).  Patient with nausea vomiting and diarrhea, likely viral or food poisoning, multiple different daily members with very similar presentation. She is already feeling much better. We'll give her another liter of fluid, and we will continue to watch her closely here. Vital signs are reassuring abdominal exam is reassuring. Slight elevation of lipase is noted. Patient is remotely status post  cholecystectomy, does not have focal pain in that area, I think this is the kind of thing that should be followed up but likely will not mandate imaging at this time. No evidence of intrathoracic pathology for this such as ACS PE or dissection. She has not had any bleeding.   ----------------------------------------- 4:52 PM on 04/06/2016 -----------------------------------------  Patient tolerating by mouth feels much better serial abdominal exams are completely benign.  Clinical Course   ____________________________________________   FINAL CLINICAL IMPRESSION(S) / ED DIAGNOSES  Final diagnoses:  None      This chart was dictated using voice recognition software.  Despite best efforts to proofread,  errors can occur which can change meaning.      Schuyler Amor, MD 04/06/16 [place], MD 04/06/16 [phone]

## 2016-04-06 NOTE — ED Triage Notes (Signed)
Pt present ambulatory to triage with reports of nausea and vomiting that began at midnight last night  Pt reports emesis x4 and lots of diarrhea  She also reports chills  Afebrile VSS

## 2016-04-06 NOTE — ED Notes (Signed)
Patient tolerating PO challenge well and states "I am feeling much better".  Md notified.

## 2016-07-16 ENCOUNTER — Encounter: Payer: 59 | Admitting: Gynecology

## 2016-07-23 ENCOUNTER — Other Ambulatory Visit: Payer: Self-pay | Admitting: Gynecology

## 2016-07-23 DIAGNOSIS — Z1231 Encounter for screening mammogram for malignant neoplasm of breast: Secondary | ICD-10-CM

## 2016-08-15 DIAGNOSIS — M654 Radial styloid tenosynovitis [de Quervain]: Secondary | ICD-10-CM | POA: Diagnosis not present

## 2016-08-16 ENCOUNTER — Ambulatory Visit
Admission: RE | Admit: 2016-08-16 | Discharge: 2016-08-16 | Disposition: A | Discharge status: Home / Self Care | Payer: 59 | Source: Ambulatory Visit | Attending: Gynecology | Admitting: Gynecology

## 2016-08-16 ENCOUNTER — Ambulatory Visit (INDEPENDENT_AMBULATORY_CARE_PROVIDER_SITE_OTHER): Payer: 59 | Admitting: Gynecology

## 2016-08-16 ENCOUNTER — Encounter: Payer: Self-pay | Admitting: Gynecology

## 2016-08-16 VITALS — BP 120/78 | Ht 65.0 in | Wt 167.0 lb

## 2016-08-16 DIAGNOSIS — Z01411 Encounter for gynecological examination (general) (routine) with abnormal findings: Secondary | ICD-10-CM | POA: Diagnosis not present

## 2016-08-16 DIAGNOSIS — N952 Postmenopausal atrophic vaginitis: Secondary | ICD-10-CM

## 2016-08-16 DIAGNOSIS — Z1231 Encounter for screening mammogram for malignant neoplasm of breast: Secondary | ICD-10-CM | POA: Diagnosis not present

## 2016-08-16 NOTE — Progress Notes (Signed)
    Kathy Mccormick May 26, 1956 [id number]        61 y.o.  G1P0010 for annual exam.    Past medical history,surgical history, problem list, medications, allergies, family history and social history were all reviewed and documented as reviewed in the EPIC chart.  ROS:  Performed with pertinent positives and negatives included in the history, assessment and plan.   Additional significant findings :  None   Exam: Kathy Mccormick assistant Vitals:   08/16/16 0927  BP: 120/78  Weight: 167 lb (75.8 kg)  Height: 5\' 5"  (1.651 m)   Body mass index is 27.79 kg/m.  General appearance:  Normal affect, orientation and appearance. Skin: Grossly normal HEENT: Without gross lesions.  No cervical or supraclavicular adenopathy. Thyroid normal.  Lungs:  Clear without wheezing, rales or rhonchi Cardiac: RR, without RMG Abdominal:  Soft, nontender, without masses, guarding, rebound, organomegaly or hernia Breasts:  Examined lying and sitting without masses, retractions, discharge or axillary adenopathy. Pelvic:  Ext, BUS, Vagina with atrophic changes  Adnexa without masses or tenderness    Anus and perineum normal   Rectovaginal normal sphincter tone without palpated masses or tenderness.    Assessment/Plan:  61 y.o. G1P0010 female for annual exam.   1. Postmenopausal/atrophic genital changes. Status post TVH with subsequent RSO and Ellis over endometriosis. Doing well without significant issues. Had tried vaginal estrogen but discontinued. Not having issues with dryness or dyspareunia. No significant hot flushes or night sweats. 2. Mammography today. SBE monthly reviewed. 3. DEXA 2013 normal. Plan repeat at age 46. Increased calcium vitamin D reviewed. Check vitamin D level. 4. Pap smear 06/2014. No Pap smear done today. 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 Options to stop screening altogether per current screening guidelines and hysterectomy history of versus less frequent screening intervals  reviewed. Will readdress on an annual basis. 5. Colonoscopy 2014. Repeat at their recommended interval. 6. 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 Has supply at home but will: Needs more. 7. Health maintenance. Lab slip given for CBC, CMP, lipid profile, TSH, vitamin D and urinalysis to be drawn at [place]. I asked her to call my office 1 week after having her labs drawn to make sure that we have received them and that they are normal. Follow up in one year, sooner as needed.   [name] MD, 10:14 AM 08/16/2016

## 2016-08-16 NOTE — Patient Instructions (Signed)

## 2016-09-06 ENCOUNTER — Encounter: Payer: Self-pay | Admitting: Gynecology

## 2016-09-06 DIAGNOSIS — Z7689 Persons encountering health services in other specified circumstances: Secondary | ICD-10-CM | POA: Diagnosis not present

## 2016-09-18 DIAGNOSIS — I10 Essential (primary) hypertension: Secondary | ICD-10-CM | POA: Diagnosis not present

## 2016-09-18 DIAGNOSIS — G43019 Migraine without aura, intractable, without status migrainosus: Secondary | ICD-10-CM | POA: Diagnosis not present

## 2016-11-01 DIAGNOSIS — D485 Neoplasm of uncertain behavior of skin: Secondary | ICD-10-CM | POA: Diagnosis not present

## 2016-11-01 DIAGNOSIS — D2261 Melanocytic nevi of right upper limb, including shoulder: Secondary | ICD-10-CM | POA: Diagnosis not present

## 2016-11-01 DIAGNOSIS — L570 Actinic keratosis: Secondary | ICD-10-CM | POA: Diagnosis not present

## 2016-11-09 DIAGNOSIS — M65311 Trigger thumb, right thumb: Secondary | ICD-10-CM | POA: Diagnosis not present

## 2016-11-09 DIAGNOSIS — M65312 Trigger thumb, left thumb: Secondary | ICD-10-CM | POA: Diagnosis not present

## 2016-11-09 DIAGNOSIS — M654 Radial styloid tenosynovitis [de Quervain]: Secondary | ICD-10-CM | POA: Diagnosis not present

## 2017-01-15 DIAGNOSIS — M654 Radial styloid tenosynovitis [de Quervain]: Secondary | ICD-10-CM | POA: Diagnosis not present

## 2017-01-15 DIAGNOSIS — M65312 Trigger thumb, left thumb: Secondary | ICD-10-CM | POA: Diagnosis not present

## 2017-01-15 DIAGNOSIS — M1812 Unilateral primary osteoarthritis of first carpometacarpal joint, left hand: Secondary | ICD-10-CM | POA: Diagnosis not present

## 2017-01-15 DIAGNOSIS — M65311 Trigger thumb, right thumb: Secondary | ICD-10-CM | POA: Diagnosis not present

## 2017-02-05 DIAGNOSIS — G43019 Migraine without aura, intractable, without status migrainosus: Secondary | ICD-10-CM | POA: Diagnosis not present

## 2017-02-05 DIAGNOSIS — I10 Essential (primary) hypertension: Secondary | ICD-10-CM | POA: Diagnosis not present

## 2017-04-16 DIAGNOSIS — B360 Pityriasis versicolor: Secondary | ICD-10-CM | POA: Diagnosis not present

## 2017-04-16 DIAGNOSIS — L309 Dermatitis, unspecified: Secondary | ICD-10-CM | POA: Diagnosis not present

## 2017-04-16 DIAGNOSIS — L719 Rosacea, unspecified: Secondary | ICD-10-CM | POA: Diagnosis not present

## 2017-04-16 DIAGNOSIS — L570 Actinic keratosis: Secondary | ICD-10-CM | POA: Diagnosis not present

## 2017-04-16 DIAGNOSIS — L820 Inflamed seborrheic keratosis: Secondary | ICD-10-CM | POA: Diagnosis not present

## 2017-05-30 DIAGNOSIS — L820 Inflamed seborrheic keratosis: Secondary | ICD-10-CM | POA: Diagnosis not present

## 2017-05-30 DIAGNOSIS — B360 Pityriasis versicolor: Secondary | ICD-10-CM | POA: Diagnosis not present

## 2017-05-30 DIAGNOSIS — L570 Actinic keratosis: Secondary | ICD-10-CM | POA: Diagnosis not present

## 2017-05-30 DIAGNOSIS — L239 Allergic contact dermatitis, unspecified cause: Secondary | ICD-10-CM | POA: Diagnosis not present

## 2017-05-30 DIAGNOSIS — L578 Other skin changes due to chronic exposure to nonionizing radiation: Secondary | ICD-10-CM | POA: Diagnosis not present

## 2017-07-10 ENCOUNTER — Other Ambulatory Visit: Payer: Self-pay | Admitting: Gynecology

## 2017-07-10 DIAGNOSIS — Z1231 Encounter for screening mammogram for malignant neoplasm of breast: Secondary | ICD-10-CM

## 2017-07-15 ENCOUNTER — Encounter: Payer: Self-pay | Admitting: Nurse Practitioner

## 2017-07-15 ENCOUNTER — Ambulatory Visit: Payer: 59 | Admitting: Nurse Practitioner

## 2017-07-15 VITALS — BP 127/81 | HR 76 | Ht 65.0 in | Wt 167.8 lb

## 2017-07-15 DIAGNOSIS — M150 Primary generalized (osteo)arthritis: Secondary | ICD-10-CM | POA: Diagnosis not present

## 2017-07-15 DIAGNOSIS — E039 Hypothyroidism, unspecified: Secondary | ICD-10-CM

## 2017-07-15 DIAGNOSIS — E663 Overweight: Secondary | ICD-10-CM

## 2017-07-15 DIAGNOSIS — G43119 Migraine with aura, intractable, without status migrainosus: Secondary | ICD-10-CM

## 2017-07-15 DIAGNOSIS — F411 Generalized anxiety disorder: Secondary | ICD-10-CM

## 2017-07-15 DIAGNOSIS — F410 Panic disorder [episodic paroxysmal anxiety] without agoraphobia: Secondary | ICD-10-CM

## 2017-07-15 DIAGNOSIS — I10 Essential (primary) hypertension: Secondary | ICD-10-CM | POA: Diagnosis not present

## 2017-07-15 MED ORDER — ERENUMAB-AOOE 70 MG/ML ~~LOC~~ SOAJ: 1.0000 mL | SUBCUTANEOUS | 5 refills | Status: DC

## 2017-07-15 MED ORDER — MELOXICAM 15 MG PO TABS: 15.0000 mg | ORAL_TABLET | ORAL | 3 refills | Status: DC | PRN

## 2017-07-15 MED ORDER — PHENTERMINE HCL 37.5 MG PO TABS: 37.5000 mg | ORAL_TABLET | Freq: Every day | ORAL | 1 refills | Status: DC

## 2017-07-15 MED ORDER — DIAZEPAM 5 MG PO TABS: 5.0000 mg | ORAL_TABLET | Freq: Two times a day (BID) | ORAL | 3 refills | Status: DC | PRN

## 2017-07-15 NOTE — Progress Notes (Signed)
[facility] [place], [place] [address]  Internal MEDICINE  Office Visit Note  Patient Name: Kathy Mccormick  [id number]  [id number]  Date of Service: 07/15/2017  No chief complaint on file.   Patient here for routine follow up exam. Still having 9-10 migraine days per month. Frequently does not make it through the month with the allowable zomig. Has tried multiple preventive medications without relief.  Pain in left shoulder and left upper chest. Pulled something while using a gait belt while caring for her mother. Pain is excruciating. Has iced it and placed heat on it. Nothing has helped.    Other  This is a chronic problem. The current episode started more than 1 year ago. The problem has been unchanged. Associated symptoms include arthralgias, coughing, fatigue, headaches, nausea, a visual change and vomiting. Pertinent negatives include no sore throat. Nothing aggravates the symptoms. She has tried sleep for the symptoms. The treatment provided moderate relief.    Pt is here for routine follow up.    Current Medication: Outpatient Encounter Medications as of 07/15/2017  Medication Sig Note  . acyclovir (ZOVIRAX) 400 MG tablet Take 1 tablet (400 mg total) by mouth daily.   Marland Kitchen ALPRAZolam (XANAX) 0.25 MG tablet Take 0.25 mg by mouth at bedtime as needed.     Marland Kitchen atorvastatin (LIPITOR) 10 MG tablet  05/19/2015: Received from: External Pharmacy  . CHERRY PO Take by mouth.     . desonide (DESOWEN) 0.05 % cream Apply 1 application topically 3 (three) times daily as needed.   . diazepam (VALIUM) 5 MG tablet Take 5 mg by mouth 2 (two) times daily as needed for anxiety.   Marland Kitchen doxycycline (VIBRA-TABS) 100 MG tablet  05/19/2015: Received from: External Pharmacy  . DULoxetine (CYMBALTA) 60 MG capsule  05/19/2015: Received from: External Pharmacy  . fluticasone (FLONASE) 50 MCG/ACT nasal spray Place 1 spray into both nostrils daily.   . Ivermectin (SOOLANTRA) 1 % CREA  Apply topically.   Marland Kitchen levothyroxine (SYNTHROID, LEVOTHROID) 50 MCG tablet  05/19/2015: Received from: External Pharmacy  . losartan (COZAAR) 100 MG tablet Take 100 mg by mouth daily.     . meloxicam (MOBIC) 15 MG tablet Take 1 tablet (15 mg total) by mouth daily. (Patient taking differently: Take 15 mg by mouth as needed. )   . Multiple Vitamin (MULTIVITAMIN) tablet Take 1 tablet by mouth daily.     Marland Kitchen omeprazole (PRILOSEC) 40 MG capsule  05/19/2015: Received from: External Pharmacy  . phentermine (ADIPEX-P) 37.5 MG tablet Take 37.5 mg by mouth daily.   Marland Kitchen zolmitriptan (ZOMIG-ZMT) 5 MG disintegrating tablet  05/19/2015: Received from: External Pharmacy  . zolpidem (AMBIEN CR) 12.5 MG CR tablet Take 12.5 mg by mouth at bedtime as needed for sleep.   . nabumetone (RELAFEN) 500 MG tablet Take 500 mg by mouth as needed.  05/19/2015: Received from: External Pharmacy  . NONFORMULARY OR COMPOUNDED ITEM Estradiol 0.02 % vaginal cream (1gram twice weekly) (Patient not taking: Reported on 08/16/2016)   . ondansetron (ZOFRAN) 4 MG tablet Take 1 tablet (4 mg total) by mouth every 8 (eight) hours as needed for nausea or vomiting. (Patient not taking: Reported on 07/15/2017)    No facility-administered encounter medications on file as of 07/15/2017.     Surgical History: Past Surgical History:  Procedure Laterality Date  . BACK SURGERY    . CARPAL TUNNEL RELEASE    . CARPAL TUNNEL RELEASE Bilateral 2005  . CHOLECYSTECTOMY    .  COLPOSCOPY    . OOPHORECTOMY     RSO,LSO  . PELVIC LAPAROSCOPY  2001,2004   DL X 2 RSO and LSO  . TONSILLECTOMY    . VAGINAL HYSTERECTOMY  2000   endometriosis    Medical History: Past Medical History:  Diagnosis Date  . Allergy    environmental  . CIN I (cervical intraepithelial neoplasia I)   . Endometriosis   . GERD (gastroesophageal reflux disease)   . Hyperlipidemia   . Hypertension   . Migraine   . Migraines   . Neck pain   . STD (sexually transmitted disease)     HSV ll    Family History: Family History  Problem Relation Age of Onset  . Hypertension Father   . Diabetes Mother   . Hypertension Mother   . Uterine cancer Mother   . Liver disease Mother   . Hypertension Brother   . Breast cancer Maternal Aunt        Age 68's  . Aneurysm Maternal Aunt     Social History   Socioeconomic History  . Marital status: Married    Spouse name: Not on file  . Number of children: Not on file  . Years of education: Not on file  . Highest education level: Not on file  Social Needs  . Financial resource strain: Not on file  . Food insecurity - worry: Not on file  . Food insecurity - inability: Not on file  . Transportation needs - medical: Not on file  . Transportation needs - non-medical: Not on file  Occupational History  . Not on file  Tobacco Use  . Smoking status: Former Research scientist (life sciences)  . Smokeless tobacco: Never Used  Substance and Sexual Activity  . Alcohol use: No    Alcohol/week: 0.0 oz  . Drug use: No  . Sexual activity: Not Currently    Birth control/protection: Surgical    Comment: 1st intercourse 62 yo-More than 5 partners  Other Topics Concern  . Not on file  Social History Narrative  . Not on file      Review of Systems  Constitutional: Positive for fatigue.  HENT: Positive for voice change. Negative for postnasal drip, rhinorrhea, sinus pressure and sore throat.   Respiratory: Positive for cough and wheezing.   Gastrointestinal: Positive for nausea and vomiting.       Decreased appetite.  Endocrine:       Thyroid problems.   Musculoskeletal: Positive for arthralgias.  Skin: Negative.   Allergic/Immunologic: Positive for environmental allergies.  Neurological: Positive for headaches.       Patient having between 9 and 10 migaine days per month.   Hematological: Negative.   Psychiatric/Behavioral: The patient is nervous/anxious.        Well controlled depression    Today's Vitals   07/15/17 1415  BP: 127/81   Pulse: 76  SpO2: 100%  Weight: 167 lb 12.8 oz (76.1 kg)  Height: 5\' 5"  (1.651 m)    Physical Exam  Constitutional: She is oriented to person, place, and time. She appears well-developed and well-nourished. No distress.  HENT:  Head: Normocephalic and atraumatic.  Mouth/Throat: Oropharynx is clear and moist. No oropharyngeal exudate.  Eyes: EOM are normal. Pupils are equal, round, and reactive to light.  Neck: Normal range of motion. Neck supple. No JVD present. Carotid bruit is not present. No tracheal deviation present. No thyromegaly present.  Cardiovascular: Normal rate, regular rhythm and normal heart sounds. Exam reveals no gallop and no friction  rub.  No murmur heard. Pulmonary/Chest: Effort normal and breath sounds normal. No respiratory distress. She has no wheezes. She has no rales. She exhibits no tenderness.  Abdominal: Soft. Bowel sounds are normal. There is no tenderness.  Musculoskeletal: Normal range of motion.  Lymphadenopathy:    She has no cervical adenopathy.  Neurological: She is alert and oriented to person, place, and time. No cranial nerve deficit.  Skin: Skin is warm and dry. She is not diaphoretic.  Psychiatric: She has a normal mood and affect. Her behavior is normal. Judgment and thought content normal.     Assessment/Plan:   1. Intractable migraine with aura without status migrainosus Will try new, once monthly, injectable, preventive therapy.  - Erenumab-aooe (AIMOVIG) 70 MG/ML SOAJ; Inject 1 mL into the skin every 30 (thirty) days.  Dispense: 1 pen; Refill: 5 She should continue to take zomig as needed and as prescribed for acute migrine headache.   2. Essential (primary) hypertension bp well controlled. Continue bp medication as prescribed   3. Acquired hypothyroidism - TSH + free T4  4. Primary generalized (osteo)arthritis - meloxicam (MOBIC) 15 MG tablet; Take 1 tablet (15 mg total) by mouth as needed.  Dispense: 30 tablet; Refill: 3  5.  Generalized anxiety disorder with panic attacks - diazepam (VALIUM) 5 MG tablet; Take 1 tablet (5 mg total) by mouth 2 (two) times daily as needed for anxiety or muscle spasms.  Dispense: 60 tablet; Refill: 3 Continue cymbalta every day  6. Overweight (BMI 25.0-29.9) recommended 1200 calorie diet and increased physical activity.  - phentermine (ADIPEX-P) 37.5 MG tablet; Take 1 tablet (37.5 mg total) by mouth daily.  Dispense: 30 tablet; Refill: 1  General Counseling: Aditi verbalizes understanding of the findings of todays visit and agrees with plan of treatment. I have discussed any further diagnostic evaluation that may be needed or ordered today. We also reviewed her medications today. she has been encouraged to call the office with any questions or concerns that should arise related to todays visit.    There is a liability release in patients' chart. There has been a 10 minute discussion about the side effects including but not limited to elevated blood pressure, anxiety, lack of sleep and dry mouth. Pt understands and will like to start/continue on appetite suppressant at this time. There will be one month RX given at the time of visit with proper follow up. Nova diet plan with restricted calories is given to the pt. Pt understands and agrees with  plan of treatment  This patient was seen by Leretha Pol, FNP- C in Collaboration with Dr Lavera Guise as a part of collaborative care agreement   Time spent: 25 Minutes   Dr Lavera Guise Internal medicine

## 2017-07-16 DIAGNOSIS — E039 Hypothyroidism, unspecified: Secondary | ICD-10-CM | POA: Diagnosis not present

## 2017-07-17 LAB — TSH+FREE T4
FREE T4: 1.06 ng/dL (ref 0.82–1.77)
TSH: 1.24 u[IU]/mL (ref 0.450–4.500)

## 2017-07-22 NOTE — Progress Notes (Signed)
Called patient letting her know that lab results are normal.

## 2017-08-16 ENCOUNTER — Telehealth: Payer: Self-pay

## 2017-08-16 ENCOUNTER — Other Ambulatory Visit: Payer: Self-pay | Admitting: Nurse Practitioner

## 2017-08-16 DIAGNOSIS — G8929 Other chronic pain: Secondary | ICD-10-CM

## 2017-08-16 DIAGNOSIS — M25512 Pain in left shoulder: Principal | ICD-10-CM

## 2017-08-16 NOTE — Telephone Encounter (Signed)
TRIED CALLING PT TO ADVISE TO GO FOR X RAY AND UNABLE TO LMOM FOR PT  BR

## 2017-08-20 ENCOUNTER — Ambulatory Visit
Admission: RE | Admit: 2017-08-20 | Discharge: 2017-08-20 | Disposition: A | Discharge status: Home / Self Care | Payer: 59 | Source: Ambulatory Visit | Attending: Nurse Practitioner | Admitting: Nurse Practitioner

## 2017-08-20 DIAGNOSIS — M25512 Pain in left shoulder: Principal | ICD-10-CM

## 2017-08-20 DIAGNOSIS — G8929 Other chronic pain: Secondary | ICD-10-CM

## 2017-08-22 ENCOUNTER — Encounter: Payer: Self-pay | Admitting: Nurse Practitioner

## 2017-08-22 ENCOUNTER — Ambulatory Visit (INDEPENDENT_AMBULATORY_CARE_PROVIDER_SITE_OTHER): Payer: 59 | Admitting: Gynecology

## 2017-08-22 ENCOUNTER — Ambulatory Visit
Admission: RE | Admit: 2017-08-22 | Discharge: 2017-08-22 | Disposition: A | Discharge status: Home / Self Care | Payer: 59 | Source: Ambulatory Visit | Attending: Gynecology | Admitting: Gynecology

## 2017-08-22 ENCOUNTER — Ambulatory Visit: Payer: 59 | Admitting: Nurse Practitioner

## 2017-08-22 ENCOUNTER — Encounter: Payer: Self-pay | Admitting: Gynecology

## 2017-08-22 ENCOUNTER — Ambulatory Visit
Admission: RE | Admit: 2017-08-22 | Discharge: 2017-08-22 | Disposition: A | Discharge status: Home / Self Care | Payer: 59 | Source: Ambulatory Visit | Attending: Nurse Practitioner | Admitting: Nurse Practitioner

## 2017-08-22 ENCOUNTER — Ambulatory Visit: Payer: 59

## 2017-08-22 VITALS — BP 118/76 | Ht 65.0 in | Wt 167.0 lb

## 2017-08-22 VITALS — BP 148/96 | HR 74 | Resp 16 | Ht 65.0 in | Wt 169.0 lb

## 2017-08-22 DIAGNOSIS — G43009 Migraine without aura, not intractable, without status migrainosus: Secondary | ICD-10-CM

## 2017-08-22 DIAGNOSIS — E663 Overweight: Secondary | ICD-10-CM | POA: Diagnosis not present

## 2017-08-22 DIAGNOSIS — G8929 Other chronic pain: Secondary | ICD-10-CM | POA: Diagnosis not present

## 2017-08-22 DIAGNOSIS — A609 Anogenital herpesviral infection, unspecified: Secondary | ICD-10-CM

## 2017-08-22 DIAGNOSIS — I10 Essential (primary) hypertension: Secondary | ICD-10-CM

## 2017-08-22 DIAGNOSIS — M25512 Pain in left shoulder: Secondary | ICD-10-CM | POA: Insufficient documentation

## 2017-08-22 DIAGNOSIS — Z1231 Encounter for screening mammogram for malignant neoplasm of breast: Secondary | ICD-10-CM | POA: Diagnosis not present

## 2017-08-22 DIAGNOSIS — N952 Postmenopausal atrophic vaginitis: Secondary | ICD-10-CM

## 2017-08-22 DIAGNOSIS — M25511 Pain in right shoulder: Secondary | ICD-10-CM | POA: Diagnosis not present

## 2017-08-22 DIAGNOSIS — Z13220 Encounter for screening for lipoid disorders: Secondary | ICD-10-CM | POA: Diagnosis not present

## 2017-08-22 DIAGNOSIS — Z01411 Encounter for gynecological examination (general) (routine) with abnormal findings: Secondary | ICD-10-CM | POA: Diagnosis not present

## 2017-08-22 DIAGNOSIS — E039 Hypothyroidism, unspecified: Secondary | ICD-10-CM | POA: Diagnosis not present

## 2017-08-22 DIAGNOSIS — M19012 Primary osteoarthritis, left shoulder: Secondary | ICD-10-CM | POA: Diagnosis not present

## 2017-08-22 MED ORDER — ERENUMAB-AOOE 70 MG/ML ~~LOC~~ SOAJ: 70.0000 mg | SUBCUTANEOUS | 11 refills | Status: DC

## 2017-08-22 MED ORDER — ACYCLOVIR 400 MG PO TABS: 400.0000 mg | ORAL_TABLET | Freq: Every day | ORAL | 4 refills | Status: DC

## 2017-08-22 MED ORDER — PHENTERMINE HCL 37.5 MG PO TABS: 37.5000 mg | ORAL_TABLET | Freq: Every day | ORAL | 1 refills | Status: DC

## 2017-08-22 MED ORDER — NONFORMULARY OR COMPOUNDED ITEM: 6 refills | Status: DC

## 2017-08-22 NOTE — Progress Notes (Signed)
    Kathy Mccormick 1955/08/14 [id number]        62 y.o.  G1P0010 for annual gynecologic exam.  Doing well without gynecologic complaints.  Past medical history,surgical history, problem list, medications, allergies, family history and social history were all reviewed and documented as reviewed in the EPIC chart.  ROS:  Performed with pertinent positives and negatives included in the history, assessment and plan.   Additional significant findings : None   Exam: [name] assistant Vitals:   08/22/17 0911  BP: 118/76  Weight: 167 lb (75.8 kg)  Height: 5\' 5"  (1.651 m)   Body mass index is 27.79 kg/m.  General appearance:  Normal affect, orientation and appearance. Skin: Grossly normal HEENT: Without gross lesions.  No cervical or supraclavicular adenopathy. Thyroid normal.  Lungs:  Clear without wheezing, rales or rhonchi Cardiac: RR, without RMG Abdominal:  Soft, nontender, without masses, guarding, rebound, organomegaly or hernia Breasts:  Examined lying and sitting without masses, retractions, discharge or axillary adenopathy. Pelvic:  Ext, BUS, Vagina: Normal with atrophic changes.  Pap smear of vaginal cuff done  Adnexa: Without masses or tenderness    Anus and perineum: Normal   Rectovaginal: Normal sphincter tone without palpated masses or tenderness.    Assessment/Plan:  62 y.o. G68P0010 female for annual gynecologic exam status post TVH and subsequent RSO and LSO for endometriosis.   1. Menopausal/atrophic genital changes.  Doing well without significant hot flushes, night sweats.  Is having vaginal dryness with some dyspareunia.  Uses estradiol formulated cream twice weekly.  Wants to continue.  Absorption risks to include systemic effects such as thrombosis and breast discussed and accepted.  Written prescription for refill times 1 year provided. 2. Mammography today.  Breast exam normal today. 3. DEXA 2013 normal.  Plan repeat DEXA next year at age 68.  Check vitamin  D level today. 4. Colonoscopy 2014.  Repeat at their recommended interval. 5. History of HSV uses acyclovir 400 mg daily.  We discussed whether to try to stopping it now but she is uncomfortable doing so.  She did have an outbreak within the past year.  Refill times 1 year provided. 6. Pap smear 2015.  Pap smear done today.  History of CIN-1 proceeding her hysterectomy with negative Pap smears since.  Options to stop screening per current screening guidelines based on hysterectomy history reviewed.  Will readdress on an annual basis. 7. Health maintenance.  Requests baseline labs.  CBC, CMP, lipid profile, vitamin D ordered.  Has had recent TSH at another physician's office.  Follow-up in 1 year, sooner as needed. 8.    [name] MD, 9:49 AM 08/22/2017

## 2017-08-22 NOTE — Patient Instructions (Signed)
Follow-up in 1 year, sooner as needed. 

## 2017-08-22 NOTE — Progress Notes (Signed)
[facility] [place], [place] [address]  Internal MEDICINE  Office Visit Note  Patient Name: Kathy Mccormick  [id number]  [id number]  Date of Service: 09/01/2017  Chief Complaint  Patient presents with  . Migraine    started Aimovig to prevent migraines.     The patient is here for routine follow up. She was started on Aimovig 70mg  monthly to help prevent headaches, which she was having every day. She has had only 2 or 3 headaches since she started. Only had to take 1/2 tablets of Zomig to relieve the headache. The only negative sideeffect she reports is mild constipation.     Pt is here for routine follow up.    Current Medication: Outpatient Encounter Medications as of 08/22/2017  Medication Sig Note  . acyclovir (ZOVIRAX) 400 MG tablet Take 1 tablet (400 mg total) by mouth daily.   Marland Kitchen atorvastatin (LIPITOR) 10 MG tablet  05/19/2015: Received from: External Pharmacy  . CHERRY PO Take by mouth.     . desonide (DESOWEN) 0.05 % cream Apply 1 application topically 3 (three) times daily as needed.   . diazepam (VALIUM) 5 MG tablet Take 1 tablet (5 mg total) by mouth 2 (two) times daily as needed for anxiety or muscle spasms.   Marland Kitchen doxycycline (VIBRA-TABS) 100 MG tablet  05/19/2015: Received from: External Pharmacy  . DULoxetine (CYMBALTA) 60 MG capsule  05/19/2015: Received from: External Pharmacy  . Erenumab-aooe (AIMOVIG) 70 MG/ML SOAJ Inject 70 mg into the skin every 30 (thirty) days.   . fluticasone (FLONASE) 50 MCG/ACT nasal spray Place 1 spray into both nostrils daily.   . Ivermectin (SOOLANTRA) 1 % CREA Apply topically.   Marland Kitchen levothyroxine (SYNTHROID, LEVOTHROID) 50 MCG tablet  05/19/2015: Received from: External Pharmacy  . losartan (COZAAR) 100 MG tablet Take 100 mg by mouth daily.     . meloxicam (MOBIC) 15 MG tablet Take 1 tablet (15 mg total) by mouth as needed.   . Multiple Vitamin (MULTIVITAMIN) tablet Take 1 tablet by mouth daily.     . nabumetone  (RELAFEN) 500 MG tablet Take 500 mg by mouth as needed.  05/19/2015: Received from: External Pharmacy  . NONFORMULARY OR COMPOUNDED ITEM Estradiol 0.02 % vaginal cream (1gram twice weekly)   . omeprazole (PRILOSEC) 40 MG capsule  05/19/2015: Received from: External Pharmacy  . ondansetron (ZOFRAN) 4 MG tablet Take 1 tablet (4 mg total) by mouth every 8 (eight) hours as needed for nausea or vomiting.   . phentermine (ADIPEX-P) 37.5 MG tablet Take 1 tablet (37.5 mg total) by mouth daily.   Marland Kitchen zolmitriptan (ZOMIG-ZMT) 5 MG disintegrating tablet  05/19/2015: Received from: External Pharmacy  . zolpidem (AMBIEN CR) 12.5 MG CR tablet Take 12.5 mg by mouth at bedtime as needed for sleep.   . [DISCONTINUED] acyclovir (ZOVIRAX) 400 MG tablet Take 1 tablet (400 mg total) by mouth daily.   . [DISCONTINUED] Erenumab-aooe (AIMOVIG) 70 MG/ML SOAJ Inject 1 mL into the skin every 30 (thirty) days.   . [DISCONTINUED] NONFORMULARY OR COMPOUNDED ITEM Estradiol 0.02 % vaginal cream (1gram twice weekly)   . [DISCONTINUED] phentermine (ADIPEX-P) 37.5 MG tablet Take 1 tablet (37.5 mg total) by mouth daily.    No facility-administered encounter medications on file as of 08/22/2017.     Surgical History: Past Surgical History:  Procedure Laterality Date  . BACK SURGERY    . CARPAL TUNNEL RELEASE    . CARPAL TUNNEL RELEASE Bilateral 2005  . CHOLECYSTECTOMY    .  COLPOSCOPY    . OOPHORECTOMY     RSO,LSO  . PELVIC LAPAROSCOPY  2001,2004   DL X 2 RSO and LSO  . TONSILLECTOMY    . VAGINAL HYSTERECTOMY  2000   endometriosis    Medical History: Past Medical History:  Diagnosis Date  . Allergy    environmental  . CIN I (cervical intraepithelial neoplasia I)   . Endometriosis   . GERD (gastroesophageal reflux disease)   . Hyperlipidemia   . Hypertension   . Migraine   . Migraines   . Neck pain   . STD (sexually transmitted disease)    HSV ll    Family History: Family History  Problem Relation Age of  Onset  . Hypertension Father   . Diabetes Mother   . Hypertension Mother   . Uterine cancer Mother   . Liver disease Mother   . Hypertension Brother   . Breast cancer Maternal Aunt        Age 32's  . Aneurysm Maternal Aunt     Social History   Socioeconomic History  . Marital status: Married    Spouse name: Not on file  . Number of children: Not on file  . Years of education: Not on file  . Highest education level: Not on file  Social Needs  . Financial resource strain: Not on file  . Food insecurity - worry: Not on file  . Food insecurity - inability: Not on file  . Transportation needs - medical: Not on file  . Transportation needs - non-medical: Not on file  Occupational History  . Not on file  Tobacco Use  . Smoking status: Former Research scientist (life sciences)  . Smokeless tobacco: Never Used  Substance and Sexual Activity  . Alcohol use: No    Alcohol/week: 0.0 oz  . Drug use: No  . Sexual activity: Not Currently    Birth control/protection: Surgical    Comment: 1st intercourse 62 yo-More than 5 partners  Other Topics Concern  . Not on file  Social History Narrative  . Not on file      Review of Systems  Constitutional: Negative for fatigue.  HENT: Positive for voice change. Negative for postnasal drip, rhinorrhea, sinus pressure and sore throat.   Eyes: Negative.   Respiratory: Negative for cough, chest tightness and wheezing.   Cardiovascular: Negative for chest pain and palpitations.  Gastrointestinal: Negative for nausea and vomiting.       Decreased appetite.  Endocrine:       Stable thyroid  Genitourinary: Negative for flank pain, frequency and urgency.  Musculoskeletal: Positive for arthralgias.  Skin: Negative.   Allergic/Immunologic: Positive for environmental allergies.  Neurological: Positive for headaches.       Patient having between 9 and 10 migaine days per month.   Hematological: Negative.   Psychiatric/Behavioral: The patient is nervous/anxious.         Well controlled depression    Today's Vitals   08/22/17 1527  BP: (!) 148/96  Pulse: 74  Resp: 16  SpO2: 97%  Weight: 169 lb (76.7 kg)  Height: 5\' 5"  (1.651 m)    Physical Exam  Constitutional: She is oriented to person, place, and time. She appears well-developed and well-nourished. No distress.  HENT:  Head: Normocephalic and atraumatic.  Mouth/Throat: Oropharynx is clear and moist. No oropharyngeal exudate.  Eyes: EOM are normal. Pupils are equal, round, and reactive to light.  Neck: Normal range of motion. Neck supple. No JVD present. Carotid bruit is not present.  No tracheal deviation present. No thyromegaly present.  Cardiovascular: Normal rate, regular rhythm and normal heart sounds. Exam reveals no gallop and no friction rub.  No murmur heard. Pulmonary/Chest: Effort normal and breath sounds normal. No respiratory distress. She has no wheezes. She has no rales. She exhibits no tenderness.  Abdominal: Soft. Bowel sounds are normal. There is no tenderness.  Musculoskeletal: Normal range of motion.  Lymphadenopathy:    She has no cervical adenopathy.  Neurological: She is alert and oriented to person, place, and time. No cranial nerve deficit.  Skin: Skin is warm and dry. She is not diaphoretic.  Psychiatric: She has a normal mood and affect. Her behavior is normal. Judgment and thought content normal.  Nursing note and vitals reviewed.   Assessment/Plan:  1. Migraine without aura and without status migrainosus, not intractable Improved frequecny and severity. - Erenumab-aooe (AIMOVIG) 70 MG/ML SOAJ; Inject 70 mg into the skin every 30 (thirty) days.  Dispense: 1 pen; Refill: 11  2. Overweight (BMI 25.0-29.9) Improved. Continue phentermine daily. Limit calorie intake to 1200 calories per day and participate in routine exercise program.  - phentermine (ADIPEX-P) 37.5 MG tablet; Take 1 tablet (37.5 mg total) by mouth daily.  Dispense: 30 tablet; Refill: 1  3. Essential  (primary) hypertension Generally stable. Continue bp medication as prescribed.   4. Hypothyroidism, unspecified type Thyroid panel stable. Continue levothyroxien as prescribed.   5. Shoulder pain, left  -  Reviewed x-ray results, showing mild arthritic changes. ROM exercises to perform to improve pain and activity.   General Counseling: Kynedi verbalizes understanding of the findings of todays visit and agrees with plan of treatment. I have discussed any further diagnostic evaluation that may be needed or ordered today. We also reviewed her medications today. she has been encouraged to call the office with any questions or concerns that should arise related to todays visit.   This patient was seen by Leretha Pol, FNP- C in Collaboration with Dr Lavera Guise as a part of collaborative care agreement   Meds ordered this encounter  Medications  . Erenumab-aooe (AIMOVIG) 70 MG/ML SOAJ    Sig: Inject 70 mg into the skin every 30 (thirty) days.    Dispense:  1 pen    Refill:  11    Order Specific Question:   Supervising Provider    Answer:   Lavera Guise [[id number]]  . phentermine (ADIPEX-P) 37.5 MG tablet    Sig: Take 1 tablet (37.5 mg total) by mouth daily.    Dispense:  30 tablet    Refill:  1    Order Specific Question:   Supervising Provider    Answer:   Lavera Guise [[id number]]    Time spent: 70 Minutes          Dr Lavera Guise Internal medicine

## 2017-08-22 NOTE — Addendum Note (Signed)
Addended by: Nelva Nay on: 08/22/2017 09:55 AM   Modules accepted: Orders

## 2017-08-23 ENCOUNTER — Encounter: Payer: Self-pay | Admitting: *Deleted

## 2017-08-23 LAB — PAP IG W/ RFLX HPV ASCU: PAP Smear Comment: 0

## 2017-08-23 LAB — COMPREHENSIVE METABOLIC PANEL
ALK PHOS: 92 IU/L (ref 39–117)
ALT: 23 IU/L (ref 0–32)
AST: 22 IU/L (ref 0–40)
Albumin/Globulin Ratio: 2.4 — ABNORMAL HIGH (ref 1.2–2.2)
Albumin: 4.3 g/dL (ref 3.5–5.5)
BUN / CREAT RATIO: 18 (ref 9–23)
BUN: 13 mg/dL (ref 6–24)
Bilirubin Total: 0.5 mg/dL (ref 0.0–1.2)
CALCIUM: 9.1 mg/dL (ref 8.7–10.2)
CO2: 25 mmol/L (ref 20–29)
CREATININE: 0.72 mg/dL (ref 0.57–1.00)
Chloride: 102 mmol/L (ref 96–106)
GFR, EST AFRICAN AMERICAN: 106 mL/min/{1.73_m2} (ref >59)
GFR, EST NON AFRICAN AMERICAN: 92 mL/min/{1.73_m2} (ref >59)
GLUCOSE: 79 mg/dL (ref 65–99)
Globulin, Total: 1.8 g/dL (ref 1.5–4.5)
Potassium: 4.3 mmol/L (ref 3.5–5.2)
Sodium: 141 mmol/L (ref 134–144)
TOTAL PROTEIN: 6.1 g/dL (ref 6.0–8.5)

## 2017-08-23 LAB — LIPID PANEL
CHOLESTEROL TOTAL: 209 mg/dL — AB (ref 100–199)
Chol/HDL Ratio: 3.3 ratio (ref 0.0–4.4)
HDL: 64 mg/dL (ref >39)
LDL CALC: 122 mg/dL — AB (ref 0–99)
Triglycerides: 115 mg/dL (ref 0–149)
VLDL CHOLESTEROL CAL: 23 mg/dL (ref 5–40)

## 2017-08-23 LAB — CBC WITH DIFFERENTIAL/PLATELET
BASOS ABS: 0 10*3/uL (ref 0.0–0.2)
Basos: 1 %
EOS (ABSOLUTE): 0.1 10*3/uL (ref 0.0–0.4)
EOS: 2 %
Hematocrit: 41.9 % (ref 34.0–46.6)
Hemoglobin: 14 g/dL (ref 11.1–15.9)
IMMATURE GRANS (ABS): 0 10*3/uL (ref 0.0–0.1)
IMMATURE GRANULOCYTES: 0 %
LYMPHS: 31 %
Lymphocytes Absolute: 1.7 10*3/uL (ref 0.7–3.1)
MCH: 30.3 pg (ref 26.6–33.0)
MCHC: 33.4 g/dL (ref 31.5–35.7)
MCV: 91 fL (ref 79–97)
MONOCYTES: 11 %
Monocytes Absolute: 0.6 10*3/uL (ref 0.1–0.9)
Neutrophils Absolute: 3.1 10*3/uL (ref 1.4–7.0)
Neutrophils: 55 %
Platelets: 313 10*3/uL (ref 150–379)
RBC: 4.62 x10E6/uL (ref 3.77–5.28)
RDW: 13.4 % (ref 12.3–15.4)
WBC: 5.5 10*3/uL (ref 3.4–10.8)

## 2017-08-23 LAB — VITAMIN D 25 HYDROXY (VIT D DEFICIENCY, FRACTURES): Vit D, 25-Hydroxy: 42.8 ng/mL (ref 30.0–100.0)

## 2017-08-27 DIAGNOSIS — H43811 Vitreous degeneration, right eye: Secondary | ICD-10-CM | POA: Diagnosis not present

## 2017-09-01 DIAGNOSIS — Z6827 Body mass index (BMI) 27.0-27.9, adult: Secondary | ICD-10-CM | POA: Insufficient documentation

## 2017-09-01 DIAGNOSIS — E039 Hypothyroidism, unspecified: Secondary | ICD-10-CM | POA: Insufficient documentation

## 2017-09-01 DIAGNOSIS — M25512 Pain in left shoulder: Secondary | ICD-10-CM | POA: Insufficient documentation

## 2017-09-01 DIAGNOSIS — E663 Overweight: Secondary | ICD-10-CM | POA: Insufficient documentation

## 2017-09-02 ENCOUNTER — Other Ambulatory Visit: Payer: Self-pay

## 2017-09-02 MED ORDER — FLUTICASONE PROPIONATE 50 MCG/ACT NA SUSP: 1.0000 | Freq: Every day | NASAL | 1 refills | Status: DC

## 2017-09-02 MED ORDER — ACYCLOVIR 400 MG PO TABS: 400.0000 mg | ORAL_TABLET | Freq: Two times a day (BID) | ORAL | 1 refills | Status: DC

## 2017-09-06 ENCOUNTER — Other Ambulatory Visit: Payer: Self-pay

## 2017-09-23 ENCOUNTER — Other Ambulatory Visit: Payer: Self-pay

## 2017-09-23 MED ORDER — ZOLMITRIPTAN 5 MG PO TBDP: 5.0000 mg | ORAL_TABLET | ORAL | 3 refills | Status: DC | PRN

## 2017-10-01 ENCOUNTER — Ambulatory Visit: Payer: Self-pay | Admitting: Nurse Practitioner

## 2017-10-21 ENCOUNTER — Telehealth: Payer: Self-pay

## 2017-10-21 NOTE — Telephone Encounter (Signed)
Rather than add anything, we can temporarily increase her cymbalta to twice daily. If that helps after several days, I can send new rx to her pharnacy.

## 2017-10-21 NOTE — Telephone Encounter (Signed)
Pt was notified to increase cymbalta to twice daily for few days and to let us know whether it is helping or not.

## 2017-11-11 ENCOUNTER — Other Ambulatory Visit: Payer: Self-pay

## 2017-11-11 DIAGNOSIS — M150 Primary generalized (osteo)arthritis: Secondary | ICD-10-CM

## 2017-11-11 MED ORDER — ZOLPIDEM TARTRATE ER 12.5 MG PO TBCR: 12.5000 mg | EXTENDED_RELEASE_TABLET | Freq: Every evening | ORAL | 0 refills | Status: DC | PRN

## 2017-11-11 MED ORDER — MELOXICAM 15 MG PO TABS: 15.0000 mg | ORAL_TABLET | ORAL | 3 refills | Status: DC | PRN

## 2017-11-11 NOTE — Telephone Encounter (Signed)
Faxed zolpidem cr 12.5 mg pres to [facility]

## 2018-03-05 ENCOUNTER — Other Ambulatory Visit: Payer: Self-pay | Admitting: Nurse Practitioner

## 2018-03-05 DIAGNOSIS — F5101 Primary insomnia: Secondary | ICD-10-CM

## 2018-03-05 MED ORDER — ZOLPIDEM TARTRATE ER 12.5 MG PO TBCR: 12.5000 mg | EXTENDED_RELEASE_TABLET | Freq: Every evening | ORAL | 1 refills | Status: DC | PRN

## 2018-03-05 NOTE — Progress Notes (Signed)
Renewed zolpidem 10mg  qhs prn insomnia per pharmacy request.

## 2018-03-07 ENCOUNTER — Other Ambulatory Visit: Payer: Self-pay | Admitting: Nurse Practitioner

## 2018-03-07 DIAGNOSIS — M150 Primary generalized (osteo)arthritis: Secondary | ICD-10-CM

## 2018-03-07 MED ORDER — MELOXICAM 15 MG PO TABS: 15.0000 mg | ORAL_TABLET | ORAL | 3 refills | Status: DC | PRN

## 2018-03-07 MED ORDER — LOSARTAN POTASSIUM 100 MG PO TABS: 100.0000 mg | ORAL_TABLET | Freq: Every day | ORAL | 1 refills | Status: DC

## 2018-03-07 MED ORDER — ACYCLOVIR 400 MG PO TABS: 400.0000 mg | ORAL_TABLET | Freq: Two times a day (BID) | ORAL | 1 refills | Status: DC

## 2018-03-20 ENCOUNTER — Ambulatory Visit: Payer: 59 | Admitting: Nurse Practitioner

## 2018-03-20 ENCOUNTER — Encounter: Payer: Self-pay | Admitting: Nurse Practitioner

## 2018-03-20 VITALS — BP 134/84 | HR 78 | Resp 16 | Ht 65.0 in | Wt 166.2 lb

## 2018-03-20 DIAGNOSIS — G43009 Migraine without aura, not intractable, without status migrainosus: Secondary | ICD-10-CM

## 2018-03-20 DIAGNOSIS — E663 Overweight: Secondary | ICD-10-CM

## 2018-03-20 DIAGNOSIS — M150 Primary generalized (osteo)arthritis: Secondary | ICD-10-CM

## 2018-03-20 DIAGNOSIS — E039 Hypothyroidism, unspecified: Secondary | ICD-10-CM

## 2018-03-20 MED ORDER — ZOLMITRIPTAN 5 MG PO TBDP: 5.0000 mg | ORAL_TABLET | ORAL | 3 refills | Status: DC | PRN

## 2018-03-20 MED ORDER — PHENTERMINE HCL 37.5 MG PO TABS: 37.5000 mg | ORAL_TABLET | Freq: Every day | ORAL | 1 refills | Status: DC

## 2018-03-20 MED ORDER — MELOXICAM 15 MG PO TABS: 15.0000 mg | ORAL_TABLET | ORAL | 3 refills | Status: DC | PRN

## 2018-03-20 NOTE — Progress Notes (Signed)
[facility] [place], [place] [address]  Internal MEDICINE  Office Visit Note  Patient Name: Kathy Mccormick  [id number]  [id number]  Date of Service: 03/30/2018  Chief Complaint  Patient presents with  . Medical Management of Chronic Issues    discuss medications  . Migraine    The patient is here for management of migraine headaches. Has had migraines for over 35 years. Recently, she had been having migraines nearly every other day. Has been on multiple medications to prevent migraines headaches.  She was started on Aimovig 70mg  monthly to help prevent headaches,  She has had only 2 or 3 headaches since she started. In fact, she states that this medication has improved her overall quality of life tremendously. States that first two to three weeks then starts to have more frequent headaches. These headaches are easier to manage. Will resolve with treatment with her Zomig. Has not needed depo-medrol or steroid taper since starting on Aimovig.  In the past, multiple treatments were tried to help relieve her migraine headaches. She has been on beta-blockers and calcium channel blockers. She has taken topamax. At one point, was going to the [facility] and receiving botox injections to reduce migraines. None of these treatments ever reduced the frequency and severity of her migraines. The patient states that on the [place], she is feeling better than she has felt in many, many years. This medication has imrpoved the quality of her life, significantly.       Current Medication: Outpatient Encounter Medications as of 03/20/2018  Medication Sig Note  . acyclovir (ZOVIRAX) 400 MG tablet Take 1 tablet (400 mg total) by mouth 2 (two) times daily.   Marland Kitchen atorvastatin (LIPITOR) 10 MG tablet  05/19/2015: Received from: External Pharmacy  . CHERRY PO Take by mouth.     . desonide (DESOWEN) 0.05 % cream Apply 1 application topically 3 (three) times daily as needed.   .  diazepam (VALIUM) 5 MG tablet Take 1 tablet (5 mg total) by mouth 2 (two) times daily as needed for anxiety or muscle spasms.   Marland Kitchen doxycycline (VIBRA-TABS) 100 MG tablet  05/19/2015: Received from: External Pharmacy  . DULoxetine (CYMBALTA) 60 MG capsule  05/19/2015: Received from: External Pharmacy  . Erenumab-aooe (AIMOVIG) 70 MG/ML SOAJ Inject 70 mg into the skin every 30 (thirty) days.   . fluticasone (FLONASE) 50 MCG/ACT nasal spray Place 1 spray into both nostrils daily.   . Ivermectin (SOOLANTRA) 1 % CREA Apply topically.   Marland Kitchen levothyroxine (SYNTHROID, LEVOTHROID) 50 MCG tablet  05/19/2015: Received from: External Pharmacy  . losartan (COZAAR) 100 MG tablet Take 1 tablet (100 mg total) by mouth daily.   . Multiple Vitamin (MULTIVITAMIN) tablet Take 1 tablet by mouth daily.     . nabumetone (RELAFEN) 500 MG tablet Take 500 mg by mouth as needed.  05/19/2015: Received from: External Pharmacy  . NONFORMULARY OR COMPOUNDED ITEM Estradiol 0.02 % vaginal cream (1gram twice weekly)   . omeprazole (PRILOSEC) 40 MG capsule  05/19/2015: Received from: External Pharmacy  . ondansetron (ZOFRAN) 4 MG tablet Take 1 tablet (4 mg total) by mouth every 8 (eight) hours as needed for nausea or vomiting.   . phentermine (ADIPEX-P) 37.5 MG tablet Take 1 tablet (37.5 mg total) by mouth daily.   Marland Kitchen zolmitriptan (ZOMIG-ZMT) 5 MG disintegrating tablet Take 1 tablet (5 mg total) by mouth as needed for migraine. May repeat after 2-3 hours. Max dose is 2 tabs in  24 hrs.   . zolpidem (AMBIEN CR) 12.5 MG CR tablet Take 1 tablet (12.5 mg total) by mouth at bedtime as needed for sleep.   . [DISCONTINUED] meloxicam (MOBIC) 15 MG tablet Take 1 tablet (15 mg total) by mouth as needed.   . [DISCONTINUED] meloxicam (MOBIC) 15 MG tablet Take 1 tablet (15 mg total) by mouth as needed.   . [DISCONTINUED] phentermine (ADIPEX-P) 37.5 MG tablet Take 1 tablet (37.5 mg total) by mouth daily.   . [DISCONTINUED] zolmitriptan (ZOMIG-ZMT) 5  MG disintegrating tablet Take 1 tablet (5 mg total) by mouth as needed for migraine. May repeat after 2-3 hours. Max dose is 2 tabs in 24 hrs.    No facility-administered encounter medications on file as of 03/20/2018.     Surgical History: Past Surgical History:  Procedure Laterality Date  . BACK SURGERY    . CARPAL TUNNEL RELEASE    . CARPAL TUNNEL RELEASE Bilateral 2005  . CHOLECYSTECTOMY    . COLPOSCOPY    . OOPHORECTOMY     RSO,LSO  . PELVIC LAPAROSCOPY  2001,2004   DL X 2 RSO and LSO  . TONSILLECTOMY    . VAGINAL HYSTERECTOMY  2000   endometriosis    Medical History: Past Medical History:  Diagnosis Date  . Allergy    environmental  . CIN I (cervical intraepithelial neoplasia I)   . Endometriosis   . GERD (gastroesophageal reflux disease)   . Hyperlipidemia   . Hypertension   . Migraine   . Migraines   . Neck pain   . STD (sexually transmitted disease)    HSV ll    Family History: Family History  Problem Relation Age of Onset  . Hypertension Father   . Diabetes Mother   . Hypertension Mother   . Uterine cancer Mother   . Liver disease Mother   . Hypertension Brother   . Breast cancer Maternal Aunt        Age 38's  . Aneurysm Maternal Aunt     Social History   Socioeconomic History  . Marital status: Married    Spouse name: Not on file  . Number of children: Not on file  . Years of education: Not on file  . Highest education level: Not on file  Occupational History  . Not on file  Social Needs  . Financial resource strain: Not on file  . Food insecurity:    Worry: Not on file    Inability: Not on file  . Transportation needs:    Medical: Not on file    Non-medical: Not on file  Tobacco Use  . Smoking status: Former Research scientist (life sciences)  . Smokeless tobacco: Never Used  Substance and Sexual Activity  . Alcohol use: No    Alcohol/week: 0.0 standard drinks  . Drug use: No  . Sexual activity: Not Currently    Birth control/protection: Surgical     Comment: 1st intercourse 62 yo-More than 5 partners  Lifestyle  . Physical activity:    Days per week: Not on file    Minutes per session: Not on file  . Stress: Not on file  Relationships  . Social connections:    Talks on phone: Not on file    Gets together: Not on file    Attends religious service: Not on file    Active member of club or organization: Not on file    Attends meetings of clubs or organizations: Not on file    Relationship status: Not on  file  . Intimate partner violence:    Fear of current or ex partner: Not on file    Emotionally abused: Not on file    Physically abused: Not on file    Forced sexual activity: Not on file  Other Topics Concern  . Not on file  Social History Narrative  . Not on file      Review of Systems  Constitutional: Negative for activity change, fatigue and unexpected weight change.  HENT: Negative for postnasal drip, rhinorrhea, sinus pressure, sore throat and voice change.   Eyes: Negative.   Respiratory: Negative for cough, chest tightness and wheezing.   Cardiovascular: Negative for chest pain and palpitations.  Gastrointestinal: Negative for nausea and vomiting.       Decreased appetite.  Endocrine: Negative for cold intolerance, heat intolerance, polydipsia, polyphagia and polyuria.       Stable thyroid  Genitourinary: Negative for flank pain, frequency and urgency.  Musculoskeletal: Positive for arthralgias. Negative for myalgias.  Skin: Negative for rash.  Allergic/Immunologic: Positive for environmental allergies.  Neurological: Positive for headaches. Negative for dizziness.       Patient having between 9 and 10 migaine days per month.   Hematological: Negative for adenopathy.  Psychiatric/Behavioral: Negative for dysphoric mood. The patient is nervous/anxious.        Well controlled depression    Today's Vitals   03/20/18 1527  BP: 134/84  Pulse: 78  Resp: 16  SpO2: 98%  Weight: 166 lb 3.2 oz (75.4 kg)  Height:  5\' 5"  (1.651 m)    Physical Exam  Constitutional: She is oriented to person, place, and time. She appears well-developed and well-nourished. No distress.  HENT:  Head: Normocephalic and atraumatic.  Nose: Nose normal.  Mouth/Throat: Oropharynx is clear and moist. No oropharyngeal exudate.  Eyes: Pupils are equal, round, and reactive to light. Conjunctivae and EOM are normal.  Neck: Normal range of motion. Neck supple. No JVD present. Carotid bruit is not present. No tracheal deviation present. No thyromegaly present.  Cardiovascular: Normal rate, regular rhythm and normal heart sounds. Exam reveals no gallop and no friction rub.  No murmur heard. Pulmonary/Chest: Effort normal and breath sounds normal. No respiratory distress. She has no wheezes. She has no rales. She exhibits no tenderness.  Abdominal: Soft. Bowel sounds are normal. There is no tenderness.  Musculoskeletal: Normal range of motion.  Lymphadenopathy:    She has no cervical adenopathy.  Neurological: She is alert and oriented to person, place, and time. No cranial nerve deficit. Coordination normal.  Skin: Skin is warm and dry. Capillary refill takes less than 2 seconds. She is not diaphoretic.  Psychiatric: She has a normal mood and affect. Her behavior is normal. Judgment and thought content normal.  Nursing note and vitals reviewed.  Assessment/Plan: 1. Migraine without aura and without status migrainosus, not intractable The patient is doing very well since starting on Aimovig. Has significantly improved the quality of her life. Will have her continue this medication every month. Continue Zomig as needed and as prescribed to relieve acute migraine headaches.  - zolmitriptan (ZOMIG-ZMT) 5 MG disintegrating tablet; Take 1 tablet (5 mg total) by mouth as needed for migraine. May repeat after 2-3 hours. Max dose is 2 tabs in 24 hrs.  Dispense: 10 tablet; Refill: 3  2. Primary generalized (osteo)arthritis Continue to take  meloxicam as needed and as prescribed to relieve pain and inflammation in the joints.  3. Acquired hypothyroidism Thyroid panel stable. Continue levothyroxine as  prescribed  4. Overweight (BMI 25.0-29.9) Will start phentermine 37.5mg  tablets daily to help with weight management. Recommend she limit calorie intake to 1200-1500 calories per day. Participate in routine exercise program. - phentermine (ADIPEX-P) 37.5 MG tablet; Take 1 tablet (37.5 mg total) by mouth daily.  Dispense: 30 tablet; Refill: 1  General Counseling: Kathy Mccormick verbalizes understanding of the findings of todays visit and agrees with plan of treatment. I have discussed any further diagnostic evaluation that may be needed or ordered today. We also reviewed her medications today. she has been encouraged to call the office with any questions or concerns that should arise related to todays visit.   There is a liability release in patients' chart. There has been a 10 minute discussion about the side effects including but not limited to elevated blood pressure, anxiety, lack of sleep and dry mouth. Pt understands and will like to start/continue on appetite suppressant at this time. There will be one month RX given at the time of visit with proper follow up. Nova diet plan with restricted calories is given to the pt. Pt understands and agrees with  plan of treatment  This patient was seen by Leretha Pol FNP Collaboration with Dr Lavera Guise as a part of collaborative care agreement    Meds ordered this encounter  Medications  . DISCONTD: meloxicam (MOBIC) 15 MG tablet    Sig: Take 1 tablet (15 mg total) by mouth as needed.    Dispense:  30 tablet    Refill:  3    Order Specific Question:   Supervising Provider    Answer:   Lavera Guise [[id number]]  . phentermine (ADIPEX-P) 37.5 MG tablet    Sig: Take 1 tablet (37.5 mg total) by mouth daily.    Dispense:  30 tablet    Refill:  1    Order Specific Question:   Supervising Provider     Answer:   Lavera Guise [[id number]]  . zolmitriptan (ZOMIG-ZMT) 5 MG disintegrating tablet    Sig: Take 1 tablet (5 mg total) by mouth as needed for migraine. May repeat after 2-3 hours. Max dose is 2 tabs in 24 hrs.    Dispense:  10 tablet    Refill:  3    Order Specific Question:   Supervising Provider    Answer:   Lavera Guise [[id number]]    Time spent: 32 Minutes      Dr Lavera Guise Internal medicine

## 2018-03-21 ENCOUNTER — Other Ambulatory Visit: Payer: Self-pay

## 2018-03-21 DIAGNOSIS — M150 Primary generalized (osteo)arthritis: Secondary | ICD-10-CM

## 2018-03-21 MED ORDER — MELOXICAM 15 MG PO TABS: 15.0000 mg | ORAL_TABLET | Freq: Every day | ORAL | 3 refills | Status: DC | PRN

## 2018-03-24 ENCOUNTER — Other Ambulatory Visit: Payer: Self-pay

## 2018-03-24 ENCOUNTER — Telehealth: Payer: Self-pay | Admitting: Nurse Practitioner

## 2018-03-24 DIAGNOSIS — M150 Primary generalized (osteo)arthritis: Secondary | ICD-10-CM

## 2018-03-24 MED ORDER — MELOXICAM 15 MG PO TABS: 15.0000 mg | ORAL_TABLET | Freq: Every day | ORAL | 3 refills | Status: DC | PRN

## 2018-03-24 NOTE — Telephone Encounter (Signed)
aimovig was denied by pts insurance

## 2018-03-30 DIAGNOSIS — M150 Primary generalized (osteo)arthritis: Secondary | ICD-10-CM | POA: Insufficient documentation

## 2018-04-18 ENCOUNTER — Telehealth: Payer: Self-pay | Admitting: Nurse Practitioner

## 2018-04-18 NOTE — Telephone Encounter (Signed)
Appeal for Aimovig inj has been approved patient was advised and [facility] advised. Approved for 6 months. 10/112019-10/16/2018

## 2018-05-05 ENCOUNTER — Ambulatory Visit: Payer: 59 | Admitting: Adult Health

## 2018-05-05 ENCOUNTER — Other Ambulatory Visit: Payer: Self-pay

## 2018-05-05 ENCOUNTER — Encounter: Payer: Self-pay | Admitting: Adult Health

## 2018-05-05 VITALS — BP 122/82 | HR 84 | Temp 98.2°F | Resp 16 | Ht 65.0 in | Wt 166.0 lb

## 2018-05-05 DIAGNOSIS — I10 Essential (primary) hypertension: Secondary | ICD-10-CM | POA: Diagnosis not present

## 2018-05-05 DIAGNOSIS — G43119 Migraine with aura, intractable, without status migrainosus: Secondary | ICD-10-CM

## 2018-05-05 MED ORDER — METHYLPREDNISOLONE ACETATE 80 MG/ML IJ SUSP: 80.0000 mg | Freq: Once | INTRAMUSCULAR | 0 refills | Status: AC

## 2018-05-05 MED ORDER — METHYLPREDNISOLONE ACETATE 80 MG/ML IJ SUSP
80.0000 mg | Freq: Once | INTRAMUSCULAR | Status: AC
  Administered 2018-05-05: 40 mg via INTRAMUSCULAR

## 2018-05-05 MED ORDER — PREDNISONE 10 MG PO TABS: ORAL_TABLET | ORAL | 0 refills | Status: DC

## 2018-05-05 NOTE — Patient Instructions (Signed)

## 2018-05-05 NOTE — Telephone Encounter (Signed)
Pt called that she had appt today she want depo medrol shot for migraines  as per adam we send pres to phar

## 2018-05-05 NOTE — Progress Notes (Signed)
[facility] [place], [place] [address]  Internal MEDICINE  Office Visit Note  Patient Name: Kathy Mccormick  [id number]  [id number]  Date of Service: 05/05/2018  Chief Complaint  Patient presents with  . Migraine     HPI Pt is here for a sick visit. She reports migraine for 9 days.  She has been taking her Zomeg with intermittent relief.  She reports no a single day in 9 days without a migraine.  In the past she has received Decadron injections she reports this works excellent to abort her migraines.  Sometimes she will even take prednisone taper after an injection if there is a lingering pain.  She denies any visual changes or fever.     Current Medication:  Outpatient Encounter Medications as of 05/05/2018  Medication Sig Note  . acyclovir (ZOVIRAX) 400 MG tablet Take 1 tablet (400 mg total) by mouth 2 (two) times daily.   Marland Kitchen atorvastatin (LIPITOR) 10 MG tablet  05/19/2015: Received from: External Pharmacy  . CHERRY PO Take by mouth.     . desonide (DESOWEN) 0.05 % cream Apply 1 application topically 3 (three) times daily as needed.   . diazepam (VALIUM) 5 MG tablet Take 1 tablet (5 mg total) by mouth 2 (two) times daily as needed for anxiety or muscle spasms.   Marland Kitchen doxycycline (VIBRA-TABS) 100 MG tablet  05/19/2015: Received from: External Pharmacy  . DULoxetine (CYMBALTA) 60 MG capsule  05/19/2015: Received from: External Pharmacy  . Erenumab-aooe (AIMOVIG) 70 MG/ML SOAJ Inject 70 mg into the skin every 30 (thirty) days.   . fluticasone (FLONASE) 50 MCG/ACT nasal spray Place 1 spray into both nostrils daily.   . Ivermectin (SOOLANTRA) 1 % CREA Apply topically.   Marland Kitchen levothyroxine (SYNTHROID, LEVOTHROID) 50 MCG tablet  05/19/2015: Received from: External Pharmacy  . losartan (COZAAR) 100 MG tablet Take 1 tablet (100 mg total) by mouth daily.   . meloxicam (MOBIC) 15 MG tablet Take 1 tablet (15 mg total) by mouth daily as needed.   . methylPREDNISolone  acetate (DEPO-MEDROL) 80 MG/ML injection Inject 1 mL (80 mg total) into the muscle once for 1 dose.   . Multiple Vitamin (MULTIVITAMIN) tablet Take 1 tablet by mouth daily.     . nabumetone (RELAFEN) 500 MG tablet Take 500 mg by mouth as needed.  05/19/2015: Received from: External Pharmacy  . NONFORMULARY OR COMPOUNDED ITEM Estradiol 0.02 % vaginal cream (1gram twice weekly)   . omeprazole (PRILOSEC) 40 MG capsule  05/19/2015: Received from: External Pharmacy  . ondansetron (ZOFRAN) 4 MG tablet Take 1 tablet (4 mg total) by mouth every 8 (eight) hours as needed for nausea or vomiting.   . phentermine (ADIPEX-P) 37.5 MG tablet Take 1 tablet (37.5 mg total) by mouth daily.   Marland Kitchen zolmitriptan (ZOMIG-ZMT) 5 MG disintegrating tablet Take 1 tablet (5 mg total) by mouth as needed for migraine. May repeat after 2-3 hours. Max dose is 2 tabs in 24 hrs.   . zolpidem (AMBIEN CR) 12.5 MG CR tablet Take 1 tablet (12.5 mg total) by mouth at bedtime as needed for sleep.   . predniSONE (DELTASONE) 10 MG tablet Use per dose pack   . [EXPIRED] methylPREDNISolone acetate (DEPO-MEDROL) injection 80 mg     No facility-administered encounter medications on file as of 05/05/2018.       Medical History: Past Medical History:  Diagnosis Date  . Allergy    environmental  . CIN I (cervical intraepithelial neoplasia  I)   . Endometriosis   . GERD (gastroesophageal reflux disease)   . Hyperlipidemia   . Hypertension   . Migraine   . Migraines   . Neck pain   . STD (sexually transmitted disease)    HSV ll     Vital Signs: BP 122/82 (BP Location: Left Arm, Patient Position: Sitting, Cuff Size: Normal)   Pulse 84   Temp 98.2 F (36.8 C) (Oral)   Resp 16   Ht 5\' 5"  (1.651 m)   Wt 166 lb (75.3 kg)   SpO2 96%   BMI 27.62 kg/m    Review of Systems  Constitutional: Negative for chills, fatigue and unexpected weight change.  HENT: Negative for congestion, rhinorrhea, sneezing and sore throat.   Eyes:  Negative for photophobia, pain and redness.  Respiratory: Negative for cough, chest tightness and shortness of breath.   Cardiovascular: Negative for chest pain and palpitations.  Gastrointestinal: Negative for abdominal pain, constipation, diarrhea, nausea and vomiting.  Endocrine: Negative.   Genitourinary: Negative for dysuria and frequency.  Musculoskeletal: Negative for arthralgias, back pain, joint swelling and neck pain.  Skin: Negative for rash.  Allergic/Immunologic: Negative.   Neurological: Positive for headaches. Negative for tremors and numbness.  Hematological: Negative for adenopathy. Does not bruise/bleed easily.  Psychiatric/Behavioral: Negative for behavioral problems and sleep disturbance. The patient is not nervous/anxious.     Physical Exam  Constitutional: She is oriented to person, place, and time. She appears well-developed and well-nourished. No distress.  HENT:  Head: Normocephalic and atraumatic.  Mouth/Throat: Oropharynx is clear and moist. No oropharyngeal exudate.  Eyes: Pupils are equal, round, and reactive to light. EOM are normal.  Neck: Normal range of motion. Neck supple. No JVD present. No tracheal deviation present. No thyromegaly present.  Cardiovascular: Normal rate, regular rhythm and normal heart sounds. Exam reveals no gallop and no friction rub.  No murmur heard. Pulmonary/Chest: Effort normal and breath sounds normal. No respiratory distress. She has no wheezes. She has no rales. She exhibits no tenderness.  Abdominal: Soft. There is no tenderness. There is no guarding.  Musculoskeletal: Normal range of motion.  Lymphadenopathy:    She has no cervical adenopathy.  Neurological: She is alert and oriented to person, place, and time. No cranial nerve deficit.  Skin: Skin is warm and dry. She is not diaphoretic.  Psychiatric: She has a normal mood and affect. Her behavior is normal. Judgment and thought content normal.  Nursing note and vitals  reviewed.  Assessment/Plan: 1. Intractable migraine with aura without status migrainosus Patient given shot of Depo-Medrol at this visit.  Also provided with prednisone taper prescription she will fill as needed.  Instructed patient that if migraine continues or returns in the next 5 to 7 days we will consider switching her monthly injectable to modality.  Also may would need to have her evaluated by neuro. - predniSONE (DELTASONE) 10 MG tablet; Use per dose pack  Dispense: 21 tablet; Refill: 0 - methylPREDNISolone acetate (DEPO-MEDROL) injection 40 mg  2. Essential (primary) hypertension Patient with pressure well controlled at this time.  Continue take medications as prescribed.  General Counseling: Andreka verbalizes understanding of the findings of todays visit and agrees with plan of treatment. I have discussed any further diagnostic evaluation that may be needed or ordered today. We also reviewed her medications today. she has been encouraged to call the office with any questions or concerns that should arise related to todays visit.   No orders of the  defined types were placed in this encounter.   Meds ordered this encounter  Medications  . predniSONE (DELTASONE) 10 MG tablet    Sig: Use per dose pack    Dispense:  21 tablet    Refill:  0  . methylPREDNISolone acetate (DEPO-MEDROL) injection 80 mg    Time spent: 25 Minutes  This patient was seen by Orson Gear AGNP-C in Collaboration with Dr Lavera Guise as a part of collaborative care agreement.  Kendell Bane AGNP-C Internal Medicine

## 2018-05-13 ENCOUNTER — Ambulatory Visit: Payer: Self-pay | Admitting: Nurse Practitioner

## 2018-05-27 ENCOUNTER — Telehealth: Payer: Self-pay | Admitting: Adult Health

## 2018-05-27 NOTE — Telephone Encounter (Signed)
emgality has been approved by insurance patient has been approved patient notified

## 2018-06-09 ENCOUNTER — Telehealth: Payer: Self-pay

## 2018-06-09 ENCOUNTER — Other Ambulatory Visit: Payer: Self-pay

## 2018-06-09 DIAGNOSIS — M150 Primary generalized (osteo)arthritis: Secondary | ICD-10-CM

## 2018-06-09 MED ORDER — OMEPRAZOLE 40 MG PO CPDR: DELAYED_RELEASE_CAPSULE | ORAL | 1 refills | Status: DC

## 2018-06-09 MED ORDER — LEVOTHYROXINE SODIUM 50 MCG PO TABS: 50.0000 ug | ORAL_TABLET | Freq: Every day | ORAL | 1 refills | Status: DC

## 2018-06-09 MED ORDER — FLUTICASONE PROPIONATE 50 MCG/ACT NA SUSP: 1.0000 | Freq: Every day | NASAL | 1 refills | Status: DC

## 2018-06-09 MED ORDER — LEVOTHYROXINE SODIUM 50 MCG PO TABS: 50.0000 ug | ORAL_TABLET | Freq: Every day | ORAL | 0 refills | Status: DC

## 2018-06-09 MED ORDER — MELOXICAM 15 MG PO TABS: 15.0000 mg | ORAL_TABLET | Freq: Every day | ORAL | 0 refills | Status: DC | PRN

## 2018-06-09 MED ORDER — ACYCLOVIR 400 MG PO TABS: 400.0000 mg | ORAL_TABLET | Freq: Two times a day (BID) | ORAL | 1 refills | Status: DC

## 2018-06-09 MED ORDER — ATORVASTATIN CALCIUM 10 MG PO TABS: 10.0000 mg | ORAL_TABLET | Freq: Every day | ORAL | 1 refills | Status: DC

## 2018-06-09 MED ORDER — DOXYCYCLINE HYCLATE 100 MG PO TABS: 100.0000 mg | ORAL_TABLET | Freq: Every day | ORAL | 1 refills | Status: DC

## 2018-06-09 MED ORDER — LOSARTAN POTASSIUM 100 MG PO TABS: 100.0000 mg | ORAL_TABLET | Freq: Every day | ORAL | 1 refills | Status: DC

## 2018-06-09 MED ORDER — DULOXETINE HCL 60 MG PO CPEP: 60.0000 mg | ORAL_CAPSULE | Freq: Every day | ORAL | 1 refills | Status: DC

## 2018-06-09 NOTE — Telephone Encounter (Signed)
lmom pt need appt  For diazepam refills all other send to [facility]

## 2018-07-13 DIAGNOSIS — J069 Acute upper respiratory infection, unspecified: Secondary | ICD-10-CM | POA: Diagnosis not present

## 2018-07-14 ENCOUNTER — Other Ambulatory Visit: Payer: Self-pay

## 2018-07-14 MED ORDER — LEVOTHYROXINE SODIUM 50 MCG PO TABS: 50.0000 ug | ORAL_TABLET | Freq: Every day | ORAL | 1 refills | Status: DC

## 2018-07-31 ENCOUNTER — Other Ambulatory Visit: Payer: Self-pay | Admitting: Gynecology

## 2018-07-31 DIAGNOSIS — Z86010 Personal history of colonic polyps: Secondary | ICD-10-CM | POA: Diagnosis not present

## 2018-07-31 DIAGNOSIS — Z1231 Encounter for screening mammogram for malignant neoplasm of breast: Secondary | ICD-10-CM

## 2018-08-11 ENCOUNTER — Other Ambulatory Visit: Payer: Self-pay

## 2018-08-11 DIAGNOSIS — M150 Primary generalized (osteo)arthritis: Secondary | ICD-10-CM

## 2018-08-11 MED ORDER — MELOXICAM 15 MG PO TABS: 15.0000 mg | ORAL_TABLET | Freq: Every day | ORAL | 0 refills | Status: DC | PRN

## 2018-08-21 DIAGNOSIS — Z86010 Personal history of colonic polyps: Secondary | ICD-10-CM | POA: Diagnosis not present

## 2018-08-21 DIAGNOSIS — K640 First degree hemorrhoids: Secondary | ICD-10-CM | POA: Diagnosis not present

## 2018-08-28 ENCOUNTER — Ambulatory Visit
Admission: RE | Admit: 2018-08-28 | Discharge: 2018-08-28 | Disposition: A | Discharge status: Home / Self Care | Payer: 59 | Source: Ambulatory Visit | Attending: Gynecology | Admitting: Gynecology

## 2018-08-28 ENCOUNTER — Ambulatory Visit (INDEPENDENT_AMBULATORY_CARE_PROVIDER_SITE_OTHER): Payer: 59 | Admitting: Gynecology

## 2018-08-28 ENCOUNTER — Encounter: Payer: Self-pay | Admitting: Gynecology

## 2018-08-28 VITALS — BP 130/86 | Ht 65.0 in | Wt 165.0 lb

## 2018-08-28 DIAGNOSIS — Z01419 Encounter for gynecological examination (general) (routine) without abnormal findings: Secondary | ICD-10-CM

## 2018-08-28 DIAGNOSIS — Z13220 Encounter for screening for lipoid disorders: Secondary | ICD-10-CM | POA: Diagnosis not present

## 2018-08-28 DIAGNOSIS — Z1329 Encounter for screening for other suspected endocrine disorder: Secondary | ICD-10-CM | POA: Diagnosis not present

## 2018-08-28 DIAGNOSIS — Z1231 Encounter for screening mammogram for malignant neoplasm of breast: Secondary | ICD-10-CM | POA: Diagnosis not present

## 2018-08-28 DIAGNOSIS — N952 Postmenopausal atrophic vaginitis: Secondary | ICD-10-CM

## 2018-08-28 MED ORDER — NONFORMULARY OR COMPOUNDED ITEM: 6 refills | Status: DC

## 2018-08-28 NOTE — Patient Instructions (Signed)
Followup for bone density as scheduled. 

## 2018-08-28 NOTE — Progress Notes (Signed)
    Kathy Mccormick 1956/05/22 [id number]        63 y.o.  G1P0010 for annual gynecologic exam.  Without gynecologic complaints  Past medical history,surgical history, problem list, medications, allergies, family history and social history were all reviewed and documented as reviewed in the EPIC chart.  ROS:  Performed with pertinent positives and negatives included in the history, assessment and plan.   Additional significant findings : None   Exam: [name] assistant Vitals:   08/28/18 0859  BP: 130/86  Weight: 165 lb (74.8 kg)  Height: 5\' 5"  (1.651 m)   Body mass index is 27.46 kg/m.  General appearance:  Normal affect, orientation and appearance. Skin: Grossly normal HEENT: Without gross lesions.  No cervical or supraclavicular adenopathy. Thyroid normal.  Lungs:  Clear without wheezing, rales or rhonchi Cardiac: RR, without RMG Abdominal:  Soft, nontender, without masses, guarding, rebound, organomegaly or hernia Breasts:  Examined lying and sitting without masses, retractions, discharge or axillary adenopathy. Pelvic:  Ext, BUS, Vagina: With atrophic changes  Adnexa: Without masses or tenderness    Anus and perineum: Normal   Rectovaginal: Normal sphincter tone without palpated masses or tenderness.    Assessment/Plan:  63 y.o. G30P0010 female for annual gynecologic exam.  Status post TVH and subsequent RSO and LSO for endometriosis.  1. Postmenopausal.  Doing well without significant menopausal symptoms.  Uses estradiol vaginal cream twice weekly for vaginal dryness and does well with this.  We have discussed the risks of absorption in the past.  Refill x1 year provided. 2. 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  Has supply at home from other provider. 3. Mammography today.  Breast exam normal today. 4. DEXA 2013 normal.  Recommend repeat DEXA now at age 40.  Patient will schedule in follow-up for this.  Vitamin D level normal last  year. 5. Colonoscopy 2020.  Repeat at their recommended interval. 6. Pap smear 2019.  No Pap smear done today.  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 7. Health maintenance.  Requests baseline labs.  CBC, CMP, lipid profile and TSH ordered.  Follow-up for bone density.  Follow-up in 1 year for annual exam.   [name] MD, 9:26 AM 08/28/2018

## 2018-08-29 ENCOUNTER — Encounter: Payer: Self-pay | Admitting: Gynecology

## 2018-08-29 LAB — CBC WITH DIFFERENTIAL/PLATELET
Absolute Monocytes: 702 cells/uL (ref 200–950)
Basophils Absolute: 72 cells/uL (ref 0–200)
Basophils Relative: 1.2 %
Eosinophils Absolute: 138 cells/uL (ref 15–500)
Eosinophils Relative: 2.3 %
HCT: 41.1 % (ref 35.0–45.0)
Hemoglobin: 14.2 g/dL (ref 11.7–15.5)
Lymphs Abs: 1644 cells/uL (ref 850–3900)
MCH: 31.3 pg (ref 27.0–33.0)
MCHC: 34.5 g/dL (ref 32.0–36.0)
MCV: 90.5 fL (ref 80.0–100.0)
MPV: 9.8 fL (ref 7.5–12.5)
Monocytes Relative: 11.7 %
Neutro Abs: 3444 cells/uL (ref 1500–7800)
Neutrophils Relative %: 57.4 %
PLATELETS: 332 10*3/uL (ref 140–400)
RBC: 4.54 10*6/uL (ref 3.80–5.10)
RDW: 12.1 % (ref 11.0–15.0)
TOTAL LYMPHOCYTE: 27.4 %
WBC: 6 10*3/uL (ref 3.8–10.8)

## 2018-08-29 LAB — COMPREHENSIVE METABOLIC PANEL
AG Ratio: 2.3 (calc) (ref 1.0–2.5)
ALT: 19 U/L (ref 6–29)
AST: 21 U/L (ref 10–35)
Albumin: 4.5 g/dL (ref 3.6–5.1)
Alkaline phosphatase (APISO): 84 U/L (ref 37–153)
BUN: 14 mg/dL (ref 7–25)
CO2: 30 mmol/L (ref 20–32)
CREATININE: 0.65 mg/dL (ref 0.50–0.99)
Calcium: 9.4 mg/dL (ref 8.6–10.4)
Chloride: 102 mmol/L (ref 98–110)
Globulin: 2 g/dL (calc) (ref 1.9–3.7)
Glucose, Bld: 84 mg/dL (ref 65–99)
Potassium: 5.1 mmol/L (ref 3.5–5.3)
Sodium: 139 mmol/L (ref 135–146)
Total Bilirubin: 0.7 mg/dL (ref 0.2–1.2)
Total Protein: 6.5 g/dL (ref 6.1–8.1)

## 2018-08-29 LAB — TSH: TSH: 1.35 mIU/L (ref 0.40–4.50)

## 2018-08-29 LAB — LIPID PANEL
CHOL/HDL RATIO: 2.9 (calc) (ref <5.0)
Cholesterol: 214 mg/dL — ABNORMAL HIGH (ref <200)
HDL: 75 mg/dL (ref >=50)
LDL Cholesterol (Calc): 120 mg/dL (calc) — ABNORMAL HIGH
Non-HDL Cholesterol (Calc): 139 mg/dL (calc) — ABNORMAL HIGH (ref <130)
Triglycerides: 93 mg/dL (ref <150)

## 2018-09-04 ENCOUNTER — Other Ambulatory Visit: Payer: Self-pay

## 2018-09-04 ENCOUNTER — Ambulatory Visit: Payer: 59 | Admitting: Nurse Practitioner

## 2018-09-04 ENCOUNTER — Encounter: Payer: Self-pay | Admitting: Nurse Practitioner

## 2018-09-04 VITALS — BP 152/72 | HR 58 | Resp 16 | Ht 65.0 in | Wt 165.4 lb

## 2018-09-04 DIAGNOSIS — G43009 Migraine without aura, not intractable, without status migrainosus: Secondary | ICD-10-CM | POA: Diagnosis not present

## 2018-09-04 DIAGNOSIS — E663 Overweight: Secondary | ICD-10-CM

## 2018-09-04 DIAGNOSIS — E039 Hypothyroidism, unspecified: Secondary | ICD-10-CM

## 2018-09-04 DIAGNOSIS — I10 Essential (primary) hypertension: Secondary | ICD-10-CM | POA: Diagnosis not present

## 2018-09-04 MED ORDER — GALCANEZUMAB-GNLM 120 MG/ML ~~LOC~~ SOAJ: 120.0000 mg | SUBCUTANEOUS | 5 refills | Status: DC

## 2018-09-04 MED ORDER — NALTREXONE-BUPROPION HCL ER 8-90 MG PO TB12: 2.0000 | ORAL_TABLET | Freq: Two times a day (BID) | ORAL | 5 refills | Status: DC

## 2018-09-04 MED ORDER — ZOLMITRIPTAN 5 MG PO TBDP: 5.0000 mg | ORAL_TABLET | ORAL | 3 refills | Status: DC | PRN

## 2018-09-04 MED ORDER — UBROGEPANT 50 MG PO TABS: 1.0000 | ORAL_TABLET | Freq: Every day | ORAL | 5 refills | Status: DC | PRN

## 2018-09-04 NOTE — Progress Notes (Signed)
[facility] [place], [place] [address]  Internal MEDICINE  Office Visit Note  Patient Name: Kathy Mccormick  [id number]  [id number]  Date of Service: 09/17/2018  Chief Complaint  Patient presents with  . Migraine    Pt have questions about migraine medication, other medication inquires    The patient is here for management of migraine headaches. Has had migraines for over 35 years. Was started on Emgality 120mg  monthly. She has had only 2 or 3 headaches since she started. In fact, she states that this medication has improved her overall quality of life tremendously. States that first two to three weeks then starts to have more frequent headaches. These headaches are easier to manage. Will resolve with treatment with her Zomig. Has not needed depo-medrol or steroid taper since starting on Emgality.  In the past, multiple treatments were tried to help relieve her migraine headaches. She has been on beta-blockers and calcium channel blockers. She has taken topamax. At one point, was going to the [facility] and receiving botox injections to reduce migraines. None of these treatments ever reduced the frequency and severity of her migraines. The patient states that on the [facility], she is feeling better than she has felt in many, many years. This medication has imrpoved the quality of her life, significantly.       Current Medication: Outpatient Encounter Medications as of 09/04/2018  Medication Sig Note  . acyclovir (ZOVIRAX) 400 MG tablet Take 1 tablet (400 mg total) by mouth 2 (two) times daily.   Marland Kitchen atorvastatin (LIPITOR) 10 MG tablet Take 1 tablet (10 mg total) by mouth at bedtime.   . Azelaic Acid 15 % cream Apply topically 2 (two) times daily. After skin is thoroughly washed and patted dry, gently but thoroughly massage a thin film of azelaic acid cream into the affected area twice daily, in the morning and evening.   Marland Kitchen CHERRY PO Take by mouth.     .  desonide (DESOWEN) 0.05 % cream Apply 1 application topically 3 (three) times daily as needed.   . diazepam (VALIUM) 5 MG tablet Take 1 tablet (5 mg total) by mouth 2 (two) times daily as needed for anxiety or muscle spasms.   Marland Kitchen doxycycline (VIBRA-TABS) 100 MG tablet Take 1 tablet (100 mg total) by mouth daily.   . DULoxetine (CYMBALTA) 60 MG capsule Take 1 capsule (60 mg total) by mouth daily.   . fluticasone (FLONASE) 50 MCG/ACT nasal spray Place 1 spray into both nostrils daily.   Marland Kitchen levothyroxine (SYNTHROID, LEVOTHROID) 50 MCG tablet Take 1 tablet (50 mcg total) by mouth daily before breakfast.   . losartan (COZAAR) 100 MG tablet Take 1 tablet (100 mg total) by mouth daily.   . Multiple Vitamin (MULTIVITAMIN) tablet Take 1 tablet by mouth daily.     . NONFORMULARY OR COMPOUNDED ITEM Estradiol 0.02 % vaginal cream (1gram twice weekly)   . omeprazole (PRILOSEC) 40 MG capsule Take 1 tab po daily   . ondansetron (ZOFRAN) 4 MG tablet Take 1 tablet (4 mg total) by mouth every 8 (eight) hours as needed for nausea or vomiting.   . zolmitriptan (ZOMIG-ZMT) 5 MG disintegrating tablet Take 1 tablet (5 mg total) by mouth as needed for migraine. May repeat after 2-3 hours. Max dose is 2 tabs in 24 hrs.   . [DISCONTINUED] Erenumab-aooe (AIMOVIG) 70 MG/ML SOAJ Inject 70 mg into the skin every 30 (thirty) days. 09/04/2018: is currently taking Emglaity  . [  DISCONTINUED] zolmitriptan (ZOMIG-ZMT) 5 MG disintegrating tablet Take 1 tablet (5 mg total) by mouth as needed for migraine. May repeat after 2-3 hours. Max dose is 2 tabs in 24 hrs.   . Galcanezumab-gnlm (EMGALITY) 120 MG/ML SOAJ Inject 120 mg into the skin every 30 (thirty) days.   . Naltrexone-buPROPion HCl ER (CONTRAVE) 8-90 MG TB12 Take 2 tablets by mouth 2 (two) times daily.   Marland Kitchen Ubrogepant (UBRELVY) 50 MG TABS Take 1 tablet by mouth daily as needed.    No facility-administered encounter medications on file as of 09/04/2018.     Surgical History: Past  Surgical History:  Procedure Laterality Date  . BACK SURGERY    . CARPAL TUNNEL RELEASE    . CARPAL TUNNEL RELEASE Bilateral 2005  . CHOLECYSTECTOMY    . COLPOSCOPY    . OOPHORECTOMY     RSO,LSO  . PELVIC LAPAROSCOPY  2001,2004   DL X 2 RSO and LSO  . TONSILLECTOMY    . VAGINAL HYSTERECTOMY  2000   endometriosis    Medical History: Past Medical History:  Diagnosis Date  . Allergy    environmental  . CIN I (cervical intraepithelial neoplasia I)   . Endometriosis   . GERD (gastroesophageal reflux disease)   . Hyperlipidemia   . Hypertension   . Migraine   . Migraines   . Neck pain   . STD (sexually transmitted disease)    HSV ll    Family History: Family History  Problem Relation Age of Onset  . Hypertension Father   . Diabetes Mother   . Hypertension Mother   . Uterine cancer Mother   . Liver disease Mother   . Hypertension Brother   . Breast cancer Maternal Aunt        Age 17's  . Aneurysm Maternal Aunt     Social History   Socioeconomic History  . Marital status: Married    Spouse name: Not on file  . Number of children: Not on file  . Years of education: Not on file  . Highest education level: Not on file  Occupational History  . Not on file  Social Needs  . Financial resource strain: Not on file  . Food insecurity:    Worry: Not on file    Inability: Not on file  . Transportation needs:    Medical: Not on file    Non-medical: Not on file  Tobacco Use  . Smoking status: Former Research scientist (life sciences)  . Smokeless tobacco: Never Used  Substance and Sexual Activity  . Alcohol use: No    Alcohol/week: 0.0 standard drinks  . Drug use: No  . Sexual activity: Not Currently    Birth control/protection: Surgical    Comment: 1st intercourse 63 yo-More than 5 partners  Lifestyle  . Physical activity:    Days per week: Not on file    Minutes per session: Not on file  . Stress: Not on file  Relationships  . Social connections:    Talks on phone: Not on file     Gets together: Not on file    Attends religious service: Not on file    Active member of club or organization: Not on file    Attends meetings of clubs or organizations: Not on file    Relationship status: Not on file  . Intimate partner violence:    Fear of current or ex partner: Not on file    Emotionally abused: Not on file    Physically abused: Not  on file    Forced sexual activity: Not on file  Other Topics Concern  . Not on file  Social History Narrative  . Not on file      Review of Systems  Constitutional: Negative for activity change, fatigue and unexpected weight change.  HENT: Negative for postnasal drip, rhinorrhea, sinus pressure, sore throat and voice change.   Respiratory: Negative for cough, chest tightness and wheezing.   Cardiovascular: Negative for chest pain and palpitations.  Gastrointestinal: Negative for nausea and vomiting.  Endocrine: Negative for cold intolerance, heat intolerance, polydipsia, polyphagia and polyuria.       Stable thyroid  Musculoskeletal: Positive for arthralgias. Negative for myalgias.  Skin: Negative for rash.  Allergic/Immunologic: Positive for environmental allergies.  Neurological: Positive for headaches. Negative for dizziness.       Migraine headaches improved significantly. Generally having only 3 o r4 per month. Oral treatment has been effective.   Hematological: Negative for adenopathy.  Psychiatric/Behavioral: Negative for dysphoric mood. The patient is nervous/anxious.        Well controlled depression    Today's Vitals   09/04/18 1601  BP: (!) 152/72  Pulse: (!) 58  Resp: 16  SpO2: 97%  Weight: 165 lb 6.4 oz (75 kg)  Height: 5\' 5"  (1.651 m)   Body mass index is 27.52 kg/m.  Physical Exam Vitals signs and nursing note reviewed.  Constitutional:      General: She is not in acute distress.    Appearance: Normal appearance. She is well-developed. She is not diaphoretic.  HENT:     Head: Normocephalic and  atraumatic.     Nose: Nose normal.     Mouth/Throat:     Pharynx: No oropharyngeal exudate.  Eyes:     Conjunctiva/sclera: Conjunctivae normal.     Pupils: Pupils are equal, round, and reactive to light.  Neck:     Musculoskeletal: Normal range of motion and neck supple.     Thyroid: No thyromegaly.     Vascular: No carotid bruit or JVD.     Trachea: No tracheal deviation.  Cardiovascular:     Rate and Rhythm: Normal rate and regular rhythm.     Heart sounds: Normal heart sounds. No murmur. No friction rub. No gallop.   Pulmonary:     Effort: Pulmonary effort is normal. No respiratory distress.     Breath sounds: Normal breath sounds. No wheezing or rales.  Chest:     Chest wall: No tenderness.  Abdominal:     General: Bowel sounds are normal.     Palpations: Abdomen is soft.     Tenderness: There is no abdominal tenderness.  Musculoskeletal: Normal range of motion.  Lymphadenopathy:     Cervical: No cervical adenopathy.  Skin:    General: Skin is warm and dry.     Capillary Refill: Capillary refill takes less than 2 seconds.  Neurological:     Mental Status: She is alert and oriented to person, place, and time.     Cranial Nerves: No cranial nerve deficit.     Coordination: Coordination normal.  Psychiatric:        Behavior: Behavior normal.        Thought Content: Thought content normal.        Judgment: Judgment normal.    Assessment/Plan: 1. Migraine without aura and without status migrainosus, not intractable Much improved. Continue emgality once monthly. Use zomig 5mg  as needed for acute migraines. Will try ubrelvy 50mg  as needed for headaches. If  effective, will send new prescription for this as well.  - zolmitriptan (ZOMIG-ZMT) 5 MG disintegrating tablet; Take 1 tablet (5 mg total) by mouth as needed for migraine. May repeat after 2-3 hours. Max dose is 2 tabs in 24 hrs.  Dispense: 10 tablet; Refill: 3 - Galcanezumab-gnlm (EMGALITY) 120 MG/ML SOAJ; Inject 120 mg  into the skin every 30 (thirty) days.  Dispense: 1 pen; Refill: 5 - Ubrogepant (UBRELVY) 50 MG TABS; Take 1 tablet by mouth daily as needed.  Dispense: 10 tablet; Refill: 5  2. Overweight (BMI 25.0-29.9) Trial of Conttrave 8/90mg  daily. Instructions written to titrate dosing to two tablets twice daily. Advised she follow up 1200 calorie diet and incorporate exercise into daily routine.  - Naltrexone-buPROPion HCl ER (CONTRAVE) 8-90 MG TB12; Take 2 tablets by mouth 2 (two) times daily.  Dispense: 120 tablet; Refill: 5  3. Essential (primary) hypertension Generally stable. Continue bp medication as prescribed.   4. Acquired hypothyroidism Stable. Continue levothyroxine as prescribed .  General Counseling: Griselda verbalizes understanding of the findings of todays visit and agrees with plan of treatment. I have discussed any further diagnostic evaluation that may be needed or ordered today. We also reviewed her medications today. she has been encouraged to call the office with any questions or concerns that should arise related to todays visit.   Hypertension Counseling:   The following hypertensive lifestyle modification were recommended and discussed:  1. Limiting alcohol intake to less than 1 oz/day of ethanol:(24 oz of beer or 8 oz of wine or 2 oz of 100-proof whiskey). 2. Take baby ASA 81 mg daily. 3. Importance of regular aerobic exercise and losing weight. 4. Reduce dietary saturated fat and cholesterol intake for overall cardiovascular health. 5. Maintaining adequate dietary potassium, calcium, and magnesium intake. 6. Regular monitoring of the blood pressure. 7. Reduce sodium intake to less than 100 mmol/day (less than 2.3 gm of sodium or less than 6 gm of sodium choride)   This patient was seen by [place] with Dr Lavera Guise as a part of collaborative care agreement  Meds ordered this encounter  Medications  . zolmitriptan (ZOMIG-ZMT) 5 MG disintegrating  tablet    Sig: Take 1 tablet (5 mg total) by mouth as needed for migraine. May repeat after 2-3 hours. Max dose is 2 tabs in 24 hrs.    Dispense:  10 tablet    Refill:  3    Order Specific Question:   Supervising Provider    Answer:   Lavera Guise [[id number]]  . Galcanezumab-gnlm (EMGALITY) 120 MG/ML SOAJ    Sig: Inject 120 mg into the skin every 30 (thirty) days.    Dispense:  1 pen    Refill:  5    Order Specific Question:   Supervising Provider    Answer:   Lavera Guise [[id number]]  . Naltrexone-buPROPion HCl ER (CONTRAVE) 8-90 MG TB12    Sig: Take 2 tablets by mouth 2 (two) times daily.    Dispense:  120 tablet    Refill:  5    Order Specific Question:   Supervising Provider    Answer:   Lavera Guise [[id number]]  . Ubrogepant (UBRELVY) 50 MG TABS    Sig: Take 1 tablet by mouth daily as needed.    Dispense:  10 tablet    Refill:  5    Sample provided in office today    Order Specific Question:   Supervising Provider  AnswerLavera Guise [[id number]]    Time spent: 36 Minutes      Dr Lavera Guise Internal medicine

## 2018-09-09 ENCOUNTER — Telehealth: Payer: Self-pay | Admitting: Nurse Practitioner

## 2018-09-09 NOTE — Telephone Encounter (Signed)
Prior authorization for medication Contrave was approved Request Reference Number: [id number]. CONTRAVE TAB 8-90MG  is approved through 03/12/2019. For further questions, call [phone]. Pharmacy contacted .

## 2018-09-12 NOTE — Telephone Encounter (Signed)
My chart message came back unread, patient informed.  

## 2018-09-29 ENCOUNTER — Other Ambulatory Visit: Payer: Self-pay

## 2018-09-29 DIAGNOSIS — G43009 Migraine without aura, not intractable, without status migrainosus: Secondary | ICD-10-CM

## 2018-09-29 MED ORDER — ZOLMITRIPTAN 5 MG PO TBDP: 5.0000 mg | ORAL_TABLET | ORAL | 3 refills | Status: DC | PRN

## 2018-10-10 ENCOUNTER — Other Ambulatory Visit: Payer: Self-pay | Admitting: Adult Health

## 2018-10-12 ENCOUNTER — Other Ambulatory Visit: Payer: Self-pay | Admitting: Internal Medicine

## 2018-10-12 DIAGNOSIS — M150 Primary generalized (osteo)arthritis: Secondary | ICD-10-CM

## 2018-10-13 ENCOUNTER — Other Ambulatory Visit: Payer: Self-pay

## 2018-10-13 MED ORDER — LEVOTHYROXINE SODIUM 50 MCG PO TABS: 50.0000 ug | ORAL_TABLET | Freq: Every day | ORAL | 1 refills | Status: DC

## 2018-10-13 MED ORDER — FLUTICASONE PROPIONATE 50 MCG/ACT NA SUSP: 1.0000 | Freq: Every day | NASAL | 1 refills | Status: DC

## 2018-10-13 MED ORDER — DOXYCYCLINE HYCLATE 100 MG PO TABS: 100.0000 mg | ORAL_TABLET | Freq: Every day | ORAL | 0 refills | Status: DC

## 2018-10-13 MED ORDER — OMEPRAZOLE 40 MG PO CPDR: DELAYED_RELEASE_CAPSULE | ORAL | 1 refills | Status: DC

## 2018-10-13 MED ORDER — DULOXETINE HCL 60 MG PO CPEP: 60.0000 mg | ORAL_CAPSULE | Freq: Every day | ORAL | 0 refills | Status: DC

## 2018-10-16 ENCOUNTER — Other Ambulatory Visit: Payer: Self-pay

## 2018-10-16 MED ORDER — AZELAIC ACID 15 % EX GEL: CUTANEOUS | 0 refills | Status: DC

## 2018-10-29 ENCOUNTER — Other Ambulatory Visit: Payer: Self-pay

## 2018-10-29 ENCOUNTER — Ambulatory Visit: Payer: 59 | Admitting: Adult Health

## 2018-10-29 ENCOUNTER — Encounter: Payer: Self-pay | Admitting: Adult Health

## 2018-10-29 VITALS — Temp 98.0°F | Ht 65.0 in | Wt 162.0 lb

## 2018-10-29 DIAGNOSIS — J029 Acute pharyngitis, unspecified: Secondary | ICD-10-CM | POA: Diagnosis not present

## 2018-10-29 DIAGNOSIS — T7840XS Allergy, unspecified, sequela: Secondary | ICD-10-CM

## 2018-10-29 NOTE — Progress Notes (Signed)
[facility] [place], [place] [address]  Internal MEDICINE  Telephone Visit  Patient Name: Kathy Mccormick  [id number]  [id number]  Date of Service: 10/29/2018  I connected with the patient at 205 by telephone and verified the patients identity using two identifiers.   I discussed the limitations, risks, security and privacy concerns of performing an evaluation and management service by telephone and the availability of in person appointments. I also discussed with the patient that there may be a patient responsible charge related to the service.  The patient expressed understanding and agrees to proceed.    Chief Complaint  Patient presents with  . Telephone Assessment  . Telephone Screen  . Sore Throat    throat red ,no fever     HPI Pt started with tickle in throat yesterday, and progressed to soreness. She denies any fever, or cough.  She has been taking extra vitamins, gargling salt water. She does not particularly feel bad.  She is mainly calling because she is concerned about covid-19.  She denies any breathing difficulty, wheezing or breathing changes.     Current Medication: Outpatient Encounter Medications as of 10/29/2018  Medication Sig  . acyclovir (ZOVIRAX) 400 MG tablet Take 1 tablet (400 mg total) by mouth 2 (two) times daily.  Marland Kitchen atorvastatin (LIPITOR) 10 MG tablet Take 1 tablet (10 mg total) by mouth at bedtime.  . Azelaic Acid 15 % cream After skin is thoroughly washed and patted dry, gently but thoroughly massage a thin film of azelaic acid cream into the affected area twice daily, in the morning and evening.  Marland Kitchen CHERRY PO Take by mouth.    . desonide (DESOWEN) 0.05 % cream Apply 1 application topically 3 (three) times daily as needed.  . diazepam (VALIUM) 5 MG tablet Take 1 tablet (5 mg total) by mouth 2 (two) times daily as needed for anxiety or muscle spasms.  Marland Kitchen doxycycline (VIBRA-TABS) 100 MG tablet Take 1 tablet (100 mg total) by mouth  daily.  . DULoxetine (CYMBALTA) 60 MG capsule Take 1 capsule (60 mg total) by mouth daily.  Marland Kitchen EMGALITY 120 MG/ML SOSY INJECT SUBCUTANEOUSLY EVERY MONTH  . fluticasone (FLONASE) 50 MCG/ACT nasal spray Place 1 spray into both nostrils daily.  . Galcanezumab-gnlm (EMGALITY) 120 MG/ML SOAJ Inject 120 mg into the skin every 30 (thirty) days.  Marland Kitchen levothyroxine (SYNTHROID, LEVOTHROID) 50 MCG tablet Take 1 tablet (50 mcg total) by mouth daily before breakfast.  . losartan (COZAAR) 100 MG tablet Take 1 tablet (100 mg total) by mouth daily.  . meloxicam (MOBIC) 15 MG tablet TAKE 1 TABLET BY MOUTH  DAILY AS NEEDED  . Multiple Vitamin (MULTIVITAMIN) tablet Take 1 tablet by mouth daily.    . Naltrexone-buPROPion HCl ER (CONTRAVE) 8-90 MG TB12 Take 2 tablets by mouth 2 (two) times daily.  . NONFORMULARY OR COMPOUNDED ITEM Estradiol 0.02 % vaginal cream (1gram twice weekly)  . omeprazole (PRILOSEC) 40 MG capsule Take 1 tab po daily  . ondansetron (ZOFRAN) 4 MG tablet Take 1 tablet (4 mg total) by mouth every 8 (eight) hours as needed for nausea or vomiting.  Marland Kitchen Ubrogepant (UBRELVY) 50 MG TABS Take 1 tablet by mouth daily as needed.  . zolmitriptan (ZOMIG-ZMT) 5 MG disintegrating tablet Take 1 tablet (5 mg total) by mouth as needed for migraine. May repeat after 2-3 hours. Max dose is 2 tabs in 24 hrs.   No facility-administered encounter medications on file as of 10/29/2018.  Surgical History: Past Surgical History:  Procedure Laterality Date  . BACK SURGERY    . CARPAL TUNNEL RELEASE    . CARPAL TUNNEL RELEASE Bilateral 2005  . CHOLECYSTECTOMY    . COLPOSCOPY    . OOPHORECTOMY     RSO,LSO  . PELVIC LAPAROSCOPY  2001,2004   DL X 2 RSO and LSO  . TONSILLECTOMY    . VAGINAL HYSTERECTOMY  2000   endometriosis    Medical History: Past Medical History:  Diagnosis Date  . Allergy    environmental  . CIN I (cervical intraepithelial neoplasia I)   . Endometriosis   . GERD (gastroesophageal reflux  disease)   . Hyperlipidemia   . Hypertension   . Migraine   . Migraines   . Neck pain   . STD (sexually transmitted disease)    HSV ll    Family History: Family History  Problem Relation Age of Onset  . Hypertension Father   . Diabetes Mother   . Hypertension Mother   . Uterine cancer Mother   . Liver disease Mother   . Hypertension Brother   . Breast cancer Maternal Aunt        Age 77's  . Aneurysm Maternal Aunt     Social History   Socioeconomic History  . Marital status: Married    Spouse name: Not on file  . Number of children: Not on file  . Years of education: Not on file  . Highest education level: Not on file  Occupational History  . Not on file  Social Needs  . Financial resource strain: Not on file  . Food insecurity:    Worry: Not on file    Inability: Not on file  . Transportation needs:    Medical: Not on file    Non-medical: Not on file  Tobacco Use  . Smoking status: Former Research scientist (life sciences)  . Smokeless tobacco: Never Used  Substance and Sexual Activity  . Alcohol use: No    Alcohol/week: 0.0 standard drinks  . Drug use: No  . Sexual activity: Not Currently    Birth control/protection: Surgical    Comment: 1st intercourse 63 yo-More than 5 partners  Lifestyle  . Physical activity:    Days per week: Not on file    Minutes per session: Not on file  . Stress: Not on file  Relationships  . Social connections:    Talks on phone: Not on file    Gets together: Not on file    Attends religious service: Not on file    Active member of club or organization: Not on file    Attends meetings of clubs or organizations: Not on file    Relationship status: Not on file  . Intimate partner violence:    Fear of current or ex partner: Not on file    Emotionally abused: Not on file    Physically abused: Not on file    Forced sexual activity: Not on file  Other Topics Concern  . Not on file  Social History Narrative  . Not on file      Review of Systems   Constitutional: Negative for chills, fatigue and unexpected weight change.  HENT: Negative for congestion, rhinorrhea, sneezing and sore throat.   Eyes: Negative for photophobia, pain and redness.  Respiratory: Negative for cough, chest tightness and shortness of breath.   Cardiovascular: Negative for chest pain and palpitations.  Gastrointestinal: Negative for abdominal pain, constipation, diarrhea, nausea and vomiting.  Endocrine: Negative.  Genitourinary: Negative for dysuria and frequency.  Musculoskeletal: Negative for arthralgias, back pain, joint swelling and neck pain.  Skin: Negative for rash.  Allergic/Immunologic: Negative.   Neurological: Negative for tremors and numbness.  Hematological: Negative for adenopathy. Does not bruise/bleed easily.  Psychiatric/Behavioral: Negative for behavioral problems and sleep disturbance. The patient is not nervous/anxious.     Vital Signs: Temp 98 F (36.7 C)   Ht 5\' 5"  (1.651 m)   Wt 162 lb (73.5 kg)   BMI 26.96 kg/m    Observation/Objective: Speaking in full sentences.  No shortness of breath.  Well appearing.     Assessment/Plan: 1. Allergic state, sequela Instructed patient to take OTC allergy medication.  RTC if fever, or other symptoms develop.   2. Sore throat Use tylenol for throat pain, RTC if it fails to improve with home remedies, or fever, sob, coughing develop.  Most likely due to PND.    General Counseling: Janaa verbalizes understanding of the findings of today's phone visit and agrees with plan of treatment. I have discussed any further diagnostic evaluation that may be needed or ordered today. We also reviewed her medications today. she has been encouraged to call the office with any questions or concerns that should arise related to todays visit.    No orders of the defined types were placed in this encounter.   No orders of the defined types were placed in this encounter.   Time spent: [place] AGNP-C Internal medicine

## 2018-10-29 NOTE — Patient Instructions (Signed)
Allergies, Adult  An allergy means that your body reacts to something that bothers it (allergen). It is not a normal reaction. This can happen from something that you:   Eat.   Breathe in.   Touch.  You can have an allergy (be allergic) to:   Outdoor things, like:  ? Pollen.  ? Grass.  ? Weeds.   Indoor things, like:  ? Dust.  ? Smoke.  ? Pet dander.   Foods.   Medicines.   Things that bother your skin, like:  ? Detergents.  ? Chemicals.  ? Latex.   Perfume.   Bugs.  An allergy cannot spread from person to person (is not contagious).  Follow these instructions at home:               Stay away from things that you know you are allergic to.   If you have allergies to things in the air, wash out your nose each day. Do it with one of these:  ? A salt-water (saline) spray.  ? A container (neti pot).   Take over-the-counter and prescription medicines only as told by your doctor.   Keep all follow-up visits as told by your doctor. This is important.   If you are at risk for a very bad allergy reaction (anaphylaxis), keep an auto-injector with you all the time. This is called an epinephrine injection.  ? This is pre-measured medicine with a needle. You can put it into your skin by yourself.  ? Right after you have a very bad allergy reaction, you or a person with you must give the medicine in less than a few minutes. This is an emergency.   If you have ever had a very bad allergy reaction, wear a medical alert bracelet or necklace. Your very bad allergy should be written on it.  Contact a health care provider if:   Your symptoms do not get better with treatment.  Get help right away if:   You have symptoms of a very bad allergy reaction. These include:  ? A swollen mouth, tongue, or throat.  ? Pain or tightness in your chest.  ? Trouble breathing.  ? Being short of breath.  ? Dizziness.  ? Fainting.  ? Very bad pain in your belly (abdomen).  ? Throwing up (vomiting).  ? Watery poop  (diarrhea).  Summary   An allergy means that your body reacts to something that bothers it (allergen). It is not a normal reaction.   Stay away from things that make your body react.   Take over-the-counter and prescription medicines only as told by your doctor.   If you are at risk for a very bad allergy reaction, carry an auto-injector (epinephrine injection) all the time. Also, wear a medical alert bracelet or necklace so people know about your allergy.  This information is not intended to replace advice given to you by your health care provider. Make sure you discuss any questions you have with your health care provider.  Document Released: 10/13/2012 Document Revised: 10/01/2016 Document Reviewed: 10/01/2016  Elsevier Interactive Patient Education  2019 [employer]

## 2018-11-26 ENCOUNTER — Other Ambulatory Visit: Payer: Self-pay

## 2018-11-26 MED ORDER — AZELAIC ACID 15 % EX GEL: CUTANEOUS | 0 refills | Status: DC

## 2018-12-15 ENCOUNTER — Telehealth: Payer: Self-pay | Admitting: Nurse Practitioner

## 2018-12-15 NOTE — Telephone Encounter (Signed)
Prior authorization for emgality has been approved by insurance  12/08/18-12/12/2019

## 2019-01-09 ENCOUNTER — Ambulatory Visit: Payer: Self-pay | Admitting: Nurse Practitioner

## 2019-01-12 ENCOUNTER — Ambulatory Visit: Payer: Self-pay | Admitting: Nurse Practitioner

## 2019-01-13 ENCOUNTER — Encounter: Payer: Self-pay | Admitting: Nurse Practitioner

## 2019-01-13 ENCOUNTER — Ambulatory Visit: Payer: 59 | Admitting: Nurse Practitioner

## 2019-01-13 VITALS — BP 126/86 | HR 76 | Temp 97.6°F | Resp 16 | Ht 65.0 in | Wt 162.6 lb

## 2019-01-13 DIAGNOSIS — G43009 Migraine without aura, not intractable, without status migrainosus: Secondary | ICD-10-CM

## 2019-01-13 DIAGNOSIS — F410 Panic disorder [episodic paroxysmal anxiety] without agoraphobia: Secondary | ICD-10-CM

## 2019-01-13 DIAGNOSIS — M150 Primary generalized (osteo)arthritis: Secondary | ICD-10-CM | POA: Diagnosis not present

## 2019-01-13 DIAGNOSIS — F411 Generalized anxiety disorder: Secondary | ICD-10-CM

## 2019-01-13 DIAGNOSIS — E782 Mixed hyperlipidemia: Secondary | ICD-10-CM | POA: Diagnosis not present

## 2019-01-13 DIAGNOSIS — I10 Essential (primary) hypertension: Secondary | ICD-10-CM | POA: Diagnosis not present

## 2019-01-13 DIAGNOSIS — L719 Rosacea, unspecified: Secondary | ICD-10-CM

## 2019-01-13 DIAGNOSIS — B009 Herpesviral infection, unspecified: Secondary | ICD-10-CM

## 2019-01-13 MED ORDER — LOSARTAN POTASSIUM 100 MG PO TABS: 100.0000 mg | ORAL_TABLET | Freq: Every day | ORAL | 1 refills | Status: DC

## 2019-01-13 MED ORDER — MELOXICAM 15 MG PO TABS: 15.0000 mg | ORAL_TABLET | Freq: Every day | ORAL | 1 refills | Status: DC | PRN

## 2019-01-13 MED ORDER — ALPRAZOLAM 0.5 MG PO TABS: 0.5000 mg | ORAL_TABLET | Freq: Two times a day (BID) | ORAL | 2 refills | Status: DC | PRN

## 2019-01-13 MED ORDER — DULOXETINE HCL 60 MG PO CPEP: ORAL_CAPSULE | ORAL | 1 refills | Status: DC

## 2019-01-13 MED ORDER — DOXYCYCLINE HYCLATE 100 MG PO TABS: 100.0000 mg | ORAL_TABLET | Freq: Every day | ORAL | 1 refills | Status: DC

## 2019-01-13 MED ORDER — ACYCLOVIR 400 MG PO TABS: 400.0000 mg | ORAL_TABLET | Freq: Two times a day (BID) | ORAL | 1 refills | Status: DC

## 2019-01-13 MED ORDER — EMGALITY 120 MG/ML ~~LOC~~ SOSY: 120.0000 mg | PREFILLED_SYRINGE | SUBCUTANEOUS | 5 refills | Status: DC

## 2019-01-13 MED ORDER — ATORVASTATIN CALCIUM 10 MG PO TABS: 10.0000 mg | ORAL_TABLET | Freq: Every day | ORAL | 1 refills | Status: DC

## 2019-01-13 NOTE — Progress Notes (Signed)
[facility] [place], [place] [address]  Internal MEDICINE  Office Visit Note  Patient Name: Kathy Mccormick  [id number]  [id number]  Date of Service: 01/21/2019  Chief Complaint  Patient presents with  . Medical Management of Chronic Issues    3 month follow up, medication refills  . Migraine  . Anxiety    pt is having some axiety due to her mother passing away at the beginning of the month and noticed anxiety     The patient is here for routine follow up visit. She states that she she has had increased anxiety and depression. Her mother passed away 16-Jan-2019. Her mom had been septic and in a-fib. Her mom was transferred to hospice and was there for about 3 to 4 weeks prior to passing away. The patient states that she has been taking second dose of duloxetine some days in order to help her keep from crying all day. She would also like to have new prescription for her alprazolam as valium does not help her deal with anxiety and depression. She is having trouble sleeping due to the sadness she is feeling.  She did start on contrave at her last visit. Initially, she lost about 8 pounds, but has stopped it because of all the stress going on after the death of her mother. Today, she states she needs to have refills of regular medications.       Current Medication: Outpatient Encounter Medications as of 01/13/2019  Medication Sig  . acyclovir (ZOVIRAX) 400 MG tablet Take 1 tablet (400 mg total) by mouth 2 (two) times daily.  Marland Kitchen atorvastatin (LIPITOR) 10 MG tablet Take 1 tablet (10 mg total) by mouth at bedtime.  . Azelaic Acid 15 % cream After skin is thoroughly washed and patted dry, gently but thoroughly massage a thin film of azelaic acid cream into the affected area twice daily, in the morning and evening.  Marland Kitchen CHERRY PO Take by mouth.    . desonide (DESOWEN) 0.05 % cream Apply 1 application topically 3 (three) times daily as needed.  . doxycycline (VIBRA-TABS)  100 MG tablet Take 1 tablet (100 mg total) by mouth daily.  . DULoxetine (CYMBALTA) 60 MG capsule Take 1 capsule po one to two times daliy  . fluticasone (FLONASE) 50 MCG/ACT nasal spray Place 1 spray into both nostrils daily.  . Galcanezumab-gnlm (EMGALITY) 120 MG/ML SOSY Inject 120 mg into the skin every 30 (thirty) days.  Marland Kitchen levothyroxine (SYNTHROID, LEVOTHROID) 50 MCG tablet Take 1 tablet (50 mcg total) by mouth daily before breakfast.  . losartan (COZAAR) 100 MG tablet Take 1 tablet (100 mg total) by mouth daily.  . meloxicam (MOBIC) 15 MG tablet Take 1 tablet (15 mg total) by mouth daily as needed.  . Multiple Vitamin (MULTIVITAMIN) tablet Take 1 tablet by mouth daily.    . Naltrexone-buPROPion HCl ER (CONTRAVE) 8-90 MG TB12 Take 2 tablets by mouth 2 (two) times daily.  . NONFORMULARY OR COMPOUNDED ITEM Estradiol 0.02 % vaginal cream (1gram twice weekly)  . omeprazole (PRILOSEC) 40 MG capsule Take 1 tab po daily  . ondansetron (ZOFRAN) 4 MG tablet Take 1 tablet (4 mg total) by mouth every 8 (eight) hours as needed for nausea or vomiting.  Marland Kitchen Ubrogepant (UBRELVY) 50 MG TABS Take 1 tablet by mouth daily as needed.  . [DISCONTINUED] acyclovir (ZOVIRAX) 400 MG tablet Take 1 tablet (400 mg total) by mouth 2 (two) times daily.  . [DISCONTINUED] atorvastatin (  LIPITOR) 10 MG tablet Take 1 tablet (10 mg total) by mouth at bedtime.  . [DISCONTINUED] diazepam (VALIUM) 5 MG tablet Take 1 tablet (5 mg total) by mouth 2 (two) times daily as needed for anxiety or muscle spasms.  . [DISCONTINUED] doxycycline (VIBRA-TABS) 100 MG tablet Take 1 tablet (100 mg total) by mouth daily.  . [DISCONTINUED] DULoxetine (CYMBALTA) 60 MG capsule Take 1 capsule (60 mg total) by mouth daily.  . [DISCONTINUED] EMGALITY 120 MG/ML SOSY INJECT SUBCUTANEOUSLY EVERY MONTH  . [DISCONTINUED] Galcanezumab-gnlm (EMGALITY) 120 MG/ML SOAJ Inject 120 mg into the skin every 30 (thirty) days.  . [DISCONTINUED] losartan (COZAAR) 100 MG  tablet Take 1 tablet (100 mg total) by mouth daily.  . [DISCONTINUED] meloxicam (MOBIC) 15 MG tablet TAKE 1 TABLET BY MOUTH  DAILY AS NEEDED  . [DISCONTINUED] zolmitriptan (ZOMIG-ZMT) 5 MG disintegrating tablet Take 1 tablet (5 mg total) by mouth as needed for migraine. May repeat after 2-3 hours. Max dose is 2 tabs in 24 hrs.  . ALPRAZolam (XANAX) 0.5 MG tablet Take 1 tablet (0.5 mg total) by mouth 2 (two) times daily as needed for anxiety.   No facility-administered encounter medications on file as of 01/13/2019.     Surgical History: Past Surgical History:  Procedure Laterality Date  . BACK SURGERY    . CARPAL TUNNEL RELEASE    . CARPAL TUNNEL RELEASE Bilateral 2005  . CHOLECYSTECTOMY    . COLPOSCOPY    . OOPHORECTOMY     RSO,LSO  . PELVIC LAPAROSCOPY  2001,2004   DL X 2 RSO and LSO  . TONSILLECTOMY    . VAGINAL HYSTERECTOMY  2000   endometriosis    Medical History: Past Medical History:  Diagnosis Date  . Allergy    environmental  . CIN I (cervical intraepithelial neoplasia I)   . Endometriosis   . GERD (gastroesophageal reflux disease)   . Hyperlipidemia   . Hypertension   . Migraine   . Migraines   . Neck pain   . STD (sexually transmitted disease)    HSV ll    Family History: Family History  Problem Relation Age of Onset  . Hypertension Father   . Diabetes Mother   . Hypertension Mother   . Uterine cancer Mother   . Liver disease Mother   . Hypertension Brother   . Breast cancer Maternal Aunt        Age 24's  . Aneurysm Maternal Aunt     Social History   Socioeconomic History  . Marital status: Married    Spouse name: Not on file  . Number of children: Not on file  . Years of education: Not on file  . Highest education level: Not on file  Occupational History  . Not on file  Social Needs  . Financial resource strain: Not on file  . Food insecurity    Worry: Not on file    Inability: Not on file  . Transportation needs    Medical: Not on  file    Non-medical: Not on file  Tobacco Use  . Smoking status: Former Research scientist (life sciences)  . Smokeless tobacco: Never Used  Substance and Sexual Activity  . Alcohol use: No    Alcohol/week: 0.0 standard drinks  . Drug use: No  . Sexual activity: Not Currently    Birth control/protection: Surgical    Comment: 1st intercourse 63 yo-More than 5 partners  Lifestyle  . Physical activity    Days per week: Not on file  Minutes per session: Not on file  . Stress: Not on file  Relationships  . Social Herbalist on phone: Not on file    Gets together: Not on file    Attends religious service: Not on file    Active member of club or organization: Not on file    Attends meetings of clubs or organizations: Not on file    Relationship status: Not on file  . Intimate partner violence    Fear of current or ex partner: Not on file    Emotionally abused: Not on file    Physically abused: Not on file    Forced sexual activity: Not on file  Other Topics Concern  . Not on file  Social History Narrative  . Not on file      Review of Systems  Constitutional: Negative for activity change, fatigue and unexpected weight change.  HENT: Negative for postnasal drip, rhinorrhea, sinus pressure, sore throat and voice change.   Respiratory: Negative for cough, chest tightness and wheezing.   Cardiovascular: Negative for chest pain and palpitations.  Gastrointestinal: Negative for nausea and vomiting.  Endocrine: Negative for cold intolerance, heat intolerance, polydipsia and polyuria.       Stable thyroid  Musculoskeletal: Positive for arthralgias. Negative for myalgias.  Skin: Negative for rash.  Allergic/Immunologic: Positive for environmental allergies.  Neurological: Positive for headaches. Negative for dizziness.       Migraine headaches improved significantly. Generally having only 3 o r4 per month. Oral treatment has been effective.   Hematological: Negative for adenopathy.   Psychiatric/Behavioral: Negative for dysphoric mood. The patient is nervous/anxious.        Depression slightly worse recently due to recent dieath of her mother.   Today's Vitals   01/13/19 1441  BP: 126/86  Pulse: 76  Resp: 16  Temp: 97.6 F (36.4 C)  SpO2: 95%  Weight: 162 lb 9.6 oz (73.8 kg)  Height: 5\' 5"  (1.651 m)   Body mass index is 27.06 kg/m.  Physical Exam Vitals signs and nursing note reviewed.  Constitutional:      General: She is not in acute distress.    Appearance: Normal appearance. She is well-developed. She is not diaphoretic.  HENT:     Head: Normocephalic and atraumatic.     Nose: Nose normal.     Mouth/Throat:     Pharynx: No oropharyngeal exudate.  Eyes:     Conjunctiva/sclera: Conjunctivae normal.     Pupils: Pupils are equal, round, and reactive to light.  Neck:     Musculoskeletal: Normal range of motion and neck supple.     Thyroid: No thyromegaly.     Vascular: No carotid bruit or JVD.     Trachea: No tracheal deviation.  Cardiovascular:     Rate and Rhythm: Normal rate and regular rhythm.     Heart sounds: Normal heart sounds. No murmur. No friction rub. No gallop.   Pulmonary:     Effort: Pulmonary effort is normal. No respiratory distress.     Breath sounds: Normal breath sounds. No wheezing or rales.  Chest:     Chest wall: No tenderness.  Abdominal:     General: Bowel sounds are normal.     Palpations: Abdomen is soft.     Tenderness: There is no abdominal tenderness.  Musculoskeletal: Normal range of motion.  Lymphadenopathy:     Cervical: No cervical adenopathy.  Skin:    General: Skin is warm and dry.  Capillary Refill: Capillary refill takes less than 2 seconds.  Neurological:     Mental Status: She is alert and oriented to person, place, and time.     Cranial Nerves: No cranial nerve deficit.     Coordination: Coordination normal.  Psychiatric:        Behavior: Behavior normal.        Thought Content: Thought  content normal.        Judgment: Judgment normal.    Assessment/Plan:  1. Essential (primary) hypertension Stable. Continue bp medications as prescribed  - losartan (COZAAR) 100 MG tablet; Take 1 tablet (100 mg total) by mouth daily.  Dispense: 90 tablet; Refill: 1  2. Primary generalized (osteo)arthritis - meloxicam (MOBIC) 15 MG tablet; Take 1 tablet (15 mg total) by mouth daily as needed.  Dispense: 90 tablet; Refill: 1  3. Migraine without aura and without status migrainosus, not intractable Continue emgality once monthly. Use abortive therapy as needed and as prescribed  - Galcanezumab-gnlm (EMGALITY) 120 MG/ML SOSY; Inject 120 mg into the skin every 30 (thirty) days.  Dispense: 1 mL; Refill: 5  4. Mixed hyperlipidemia - atorvastatin (LIPITOR) 10 MG tablet; Take 1 tablet (10 mg total) by mouth at bedtime.  Dispense: 90 tablet; Refill: 1  5. Generalized anxiety disorder with panic attacks Stable. Continue duloxetine 60mg  twice daily. May take alprazolam 0.5mg  up to twice daily if needed for acute anxiety.  - DULoxetine (CYMBALTA) 60 MG capsule; Take 1 capsule po one to two times daliy  Dispense: 180 capsule; Refill: 1 - ALPRAZolam (XANAX) 0.5 MG tablet; Take 1 tablet (0.5 mg total) by mouth 2 (two) times daily as needed for anxiety.  Dispense: 60 tablet; Refill: 2  6. Rosacea - doxycycline (VIBRA-TABS) 100 MG tablet; Take 1 tablet (100 mg total) by mouth daily.  Dispense: 90 tablet; Refill: 1  7. Herpes simplex disease - acyclovir (ZOVIRAX) 400 MG tablet; Take 1 tablet (400 mg total) by mouth 2 (two) times daily.  Dispense: 180 tablet; Refill: 1   General Counseling: Eller verbalizes understanding of the findings of todays visit and agrees with plan of treatment. I have discussed any further diagnostic evaluation that may be needed or ordered today. We also reviewed her medications today. she has been encouraged to call the office with any questions or concerns that should arise  related to todays visit.  Hypertension Counseling:   The following hypertensive lifestyle modification were recommended and discussed:  1. Limiting alcohol intake to less than 1 oz/day of ethanol:(24 oz of beer or 8 oz of wine or 2 oz of 100-proof whiskey). 2. Take baby ASA 81 mg daily. 3. Importance of regular aerobic exercise and losing weight. 4. Reduce dietary saturated fat and cholesterol intake for overall cardiovascular health. 5. Maintaining adequate dietary potassium, calcium, and magnesium intake. 6. Regular monitoring of the blood pressure. 7. Reduce sodium intake to less than 100 mmol/day (less than 2.3 gm of sodium or less than 6 gm of sodium choride)   This patient was seen by [place] with Dr Lavera Guise as a part of collaborative care agreement  Meds ordered this encounter  Medications  . DULoxetine (CYMBALTA) 60 MG capsule    Sig: Take 1 capsule po one to two times daliy    Dispense:  180 capsule    Refill:  1    Please note increase in dosing.    Order Specific Question:   Supervising Provider    Answer:  KHAN, FOZIA M [[id number]]  . acyclovir (ZOVIRAX) 400 MG tablet    Sig: Take 1 tablet (400 mg total) by mouth 2 (two) times daily.    Dispense:  180 tablet    Refill:  1    Order Specific Question:   Supervising Provider    Answer:   Lavera Guise [[id number]]  . atorvastatin (LIPITOR) 10 MG tablet    Sig: Take 1 tablet (10 mg total) by mouth at bedtime.    Dispense:  90 tablet    Refill:  1    Order Specific Question:   Supervising Provider    Answer:   Lavera Guise [[id number]]  . Galcanezumab-gnlm (EMGALITY) 120 MG/ML SOSY    Sig: Inject 120 mg into the skin every 30 (thirty) days.    Dispense:  1 mL    Refill:  5    Order Specific Question:   Supervising Provider    Answer:   Lavera Guise [[id number]]  . losartan (COZAAR) 100 MG tablet    Sig: Take 1 tablet (100 mg total) by mouth daily.    Dispense:  90 tablet    Refill:  1    Order Specific  Question:   Supervising Provider    Answer:   Lavera Guise [[id number]]  . meloxicam (MOBIC) 15 MG tablet    Sig: Take 1 tablet (15 mg total) by mouth daily as needed.    Dispense:  90 tablet    Refill:  1    Order Specific Question:   Supervising Provider    Answer:   Lavera Guise [[id number]]  . doxycycline (VIBRA-TABS) 100 MG tablet    Sig: Take 1 tablet (100 mg total) by mouth daily.    Dispense:  90 tablet    Refill:  1    Order Specific Question:   Supervising Provider    Answer:   Lavera Guise [[id number]]  . ALPRAZolam (XANAX) 0.5 MG tablet    Sig: Take 1 tablet (0.5 mg total) by mouth 2 (two) times daily as needed for anxiety.    Dispense:  60 tablet    Refill:  2    Order Specific Question:   Supervising Provider    Answer:   Lavera Guise [[id number]]    Time spent: 34 Minutes      Dr Lavera Guise Internal medicine

## 2019-01-15 IMAGING — MG DIGITAL SCREENING BILATERAL MAMMOGRAM WITH CAD
5 series · 5 of 5 positions shown · non-contrast
Comparison: Previous exam(s).

CLINICAL DATA: Screening.

EXAM:
DIGITAL SCREENING BILATERAL MAMMOGRAM WITH CAD

[L CC]
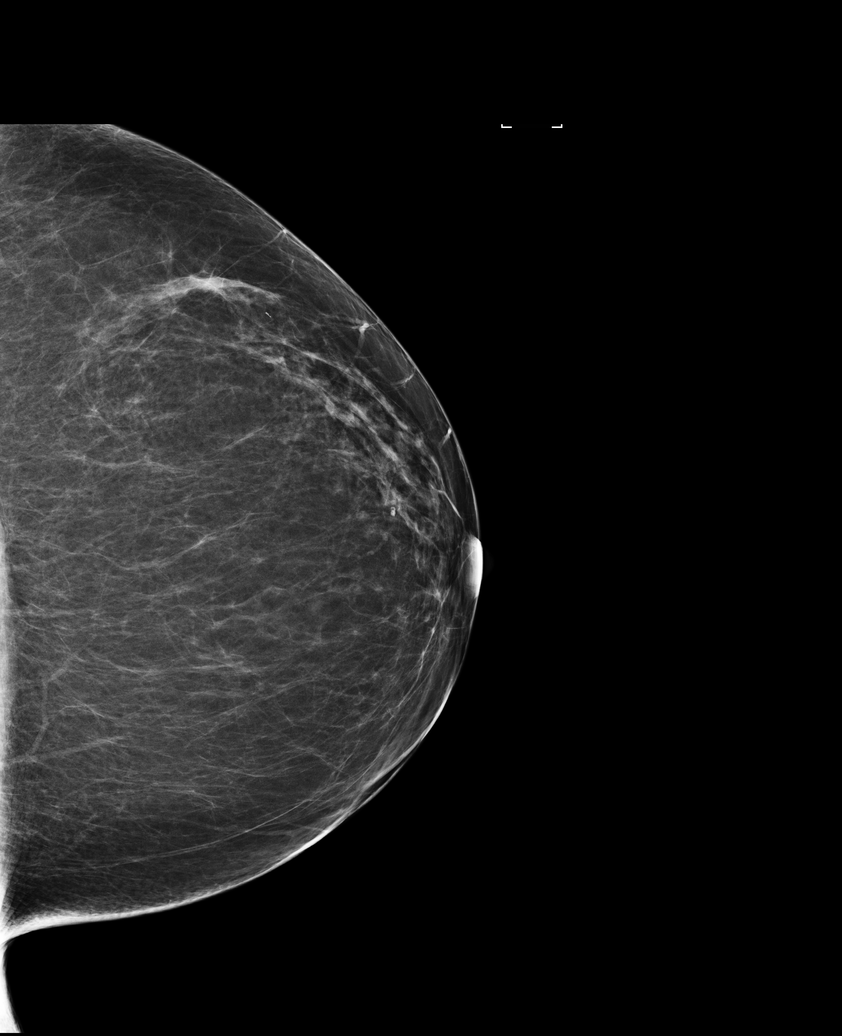

[R CC]
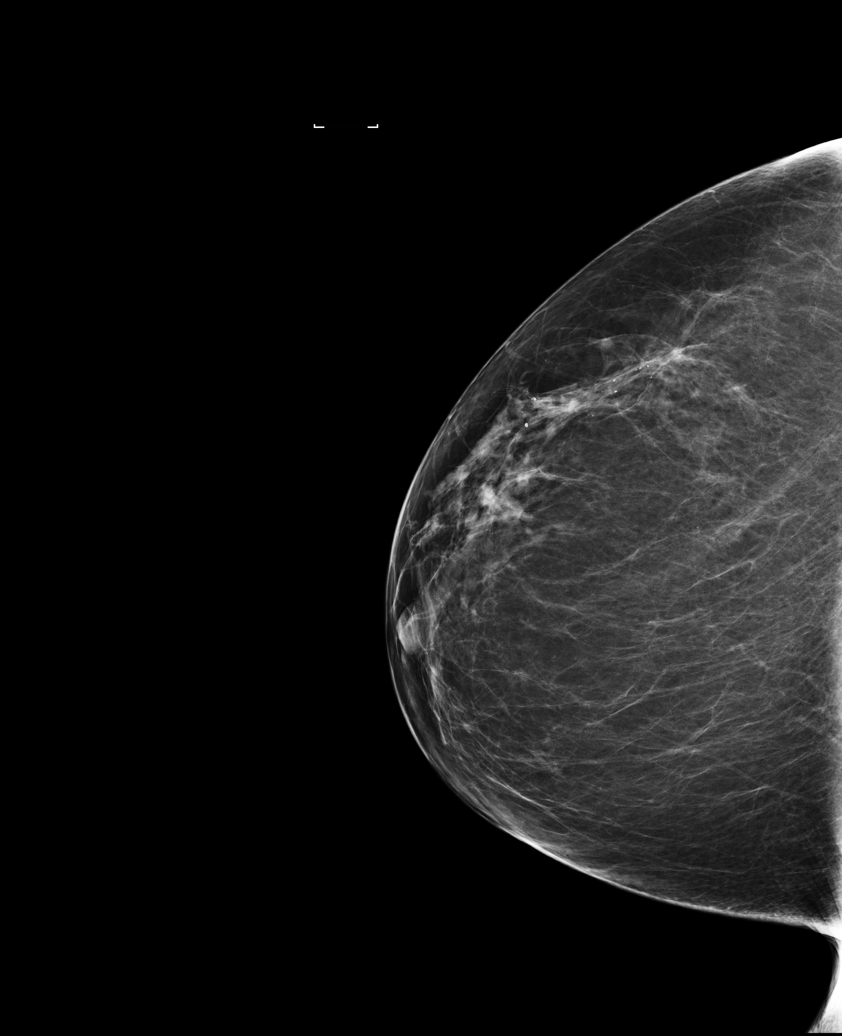

[L MLO]
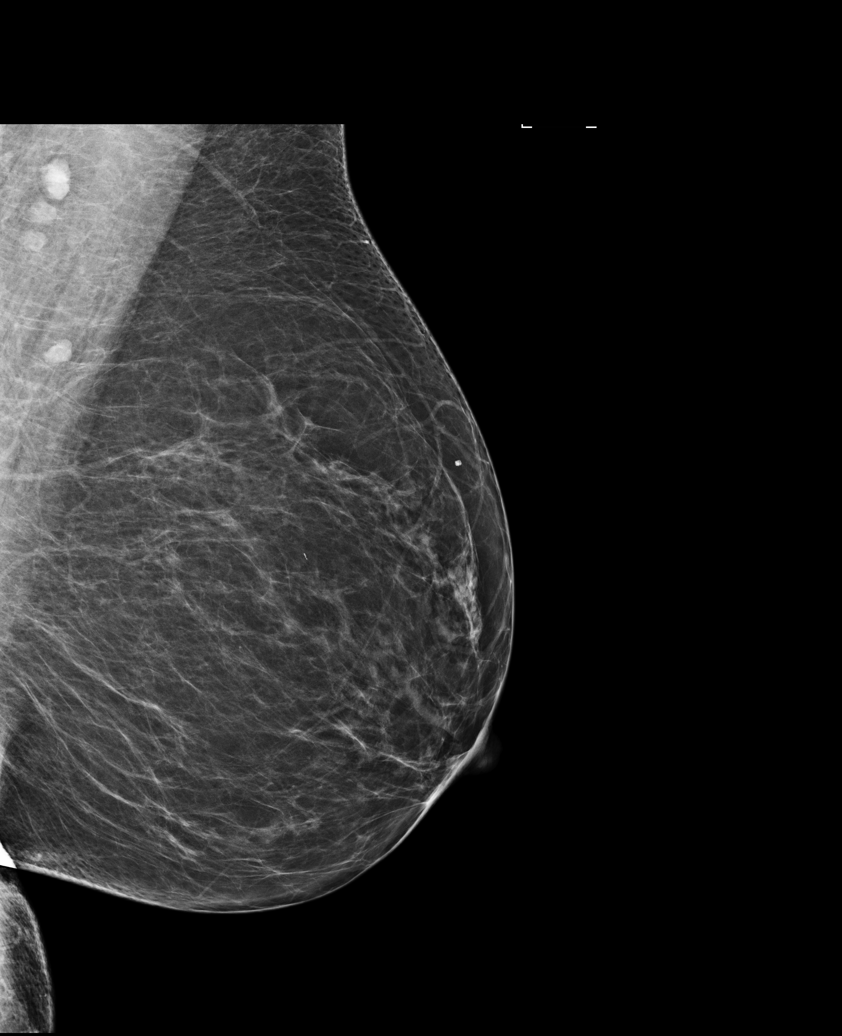

[R MLO (1 of 2)]
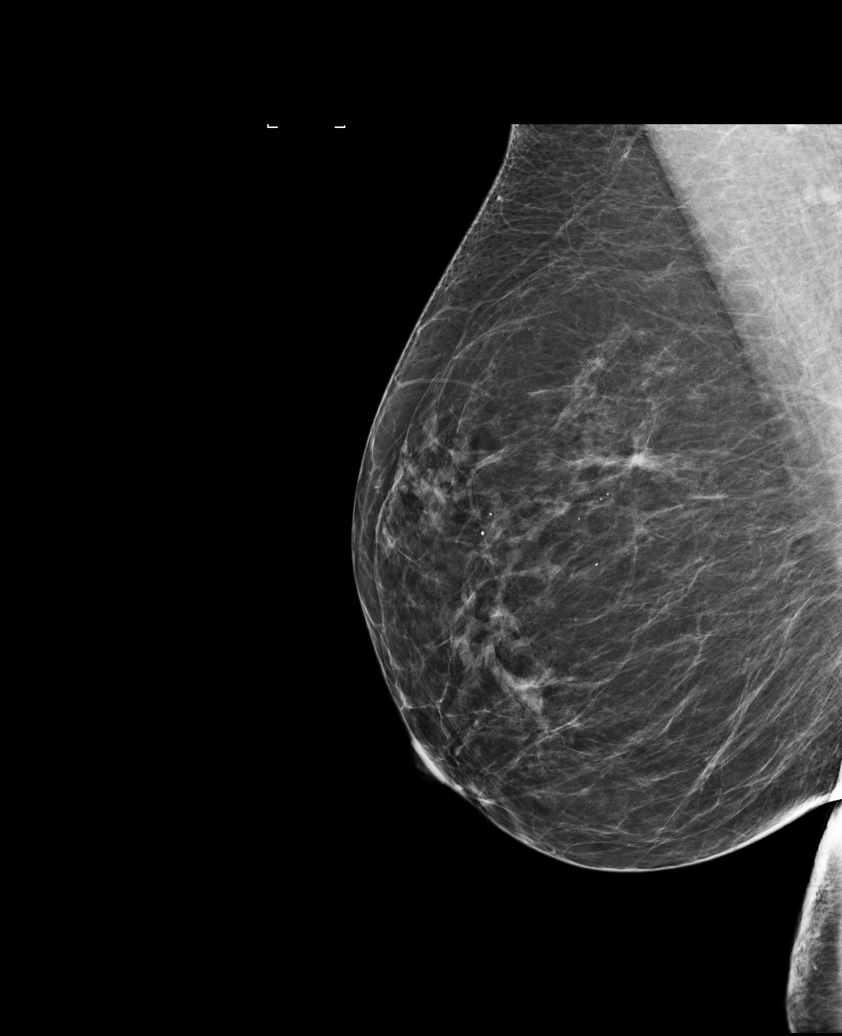

[R MLO (2 of 2)]
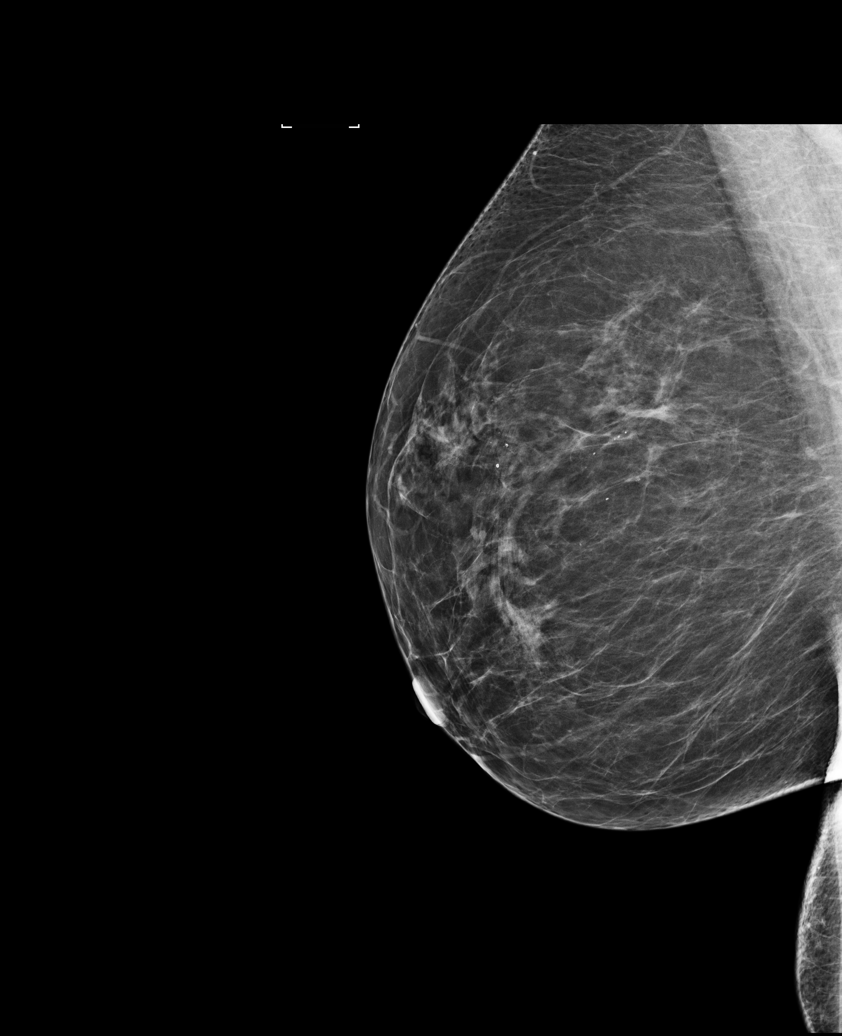

[5 of 5 positions shown; findings below may reference images not displayed]

ACR Breast Density Category b: There are scattered areas of
fibroglandular density.
FINDINGS: There are no findings suspicious for malignancy. Images were
processed with CAD.
IMPRESSION: No mammographic evidence of malignancy. A result letter of this
screening mammogram will be mailed directly to the patient.

RECOMMENDATION:
Screening mammogram in one year. (Code:AS-G-LCT)

BI-RADS CATEGORY  1: Negative.

## 2019-01-20 ENCOUNTER — Other Ambulatory Visit: Payer: Self-pay | Admitting: Nurse Practitioner

## 2019-01-20 DIAGNOSIS — G43009 Migraine without aura, not intractable, without status migrainosus: Secondary | ICD-10-CM

## 2019-01-20 MED ORDER — ZOLMITRIPTAN 5 MG PO TBDP: 5.0000 mg | ORAL_TABLET | ORAL | 3 refills | Status: DC | PRN

## 2019-01-21 DIAGNOSIS — E782 Mixed hyperlipidemia: Secondary | ICD-10-CM | POA: Insufficient documentation

## 2019-01-21 DIAGNOSIS — L719 Rosacea, unspecified: Secondary | ICD-10-CM | POA: Insufficient documentation

## 2019-01-21 DIAGNOSIS — B009 Herpesviral infection, unspecified: Secondary | ICD-10-CM | POA: Insufficient documentation

## 2019-01-21 DIAGNOSIS — F410 Panic disorder [episodic paroxysmal anxiety] without agoraphobia: Secondary | ICD-10-CM | POA: Insufficient documentation

## 2019-03-31 ENCOUNTER — Encounter: Payer: Self-pay | Admitting: Gynecology

## 2019-04-14 ENCOUNTER — Other Ambulatory Visit: Payer: Self-pay | Admitting: Nurse Practitioner

## 2019-04-14 DIAGNOSIS — F5101 Primary insomnia: Secondary | ICD-10-CM

## 2019-04-14 MED ORDER — ZOLPIDEM TARTRATE ER 12.5 MG PO TBCR: 12.5000 mg | EXTENDED_RELEASE_TABLET | Freq: Every evening | ORAL | 0 refills | Status: DC | PRN

## 2019-04-14 NOTE — Progress Notes (Signed)
Approved prescription for zolpidem CR 12.5mg  tablets at bedtime as needed. Sent single, 90 day prescription to [facility]. Per pharmacy request.

## 2019-05-14 ENCOUNTER — Telehealth: Payer: Self-pay

## 2019-05-14 NOTE — Telephone Encounter (Signed)
SCREENED AND CONFIRMED 05-18-19 APPOINTMENT

## 2019-05-18 ENCOUNTER — Ambulatory Visit: Payer: 59 | Admitting: Nurse Practitioner

## 2019-05-18 ENCOUNTER — Telehealth: Payer: Self-pay

## 2019-05-18 NOTE — Telephone Encounter (Signed)
Patient rescheduled appointment for 05/18/19 to 05/19/19. klh

## 2019-05-19 ENCOUNTER — Other Ambulatory Visit: Payer: Self-pay

## 2019-05-19 ENCOUNTER — Encounter: Payer: Self-pay | Admitting: Nurse Practitioner

## 2019-05-19 ENCOUNTER — Ambulatory Visit: Payer: 59 | Admitting: Nurse Practitioner

## 2019-05-19 VITALS — BP 139/88 | HR 69 | Temp 97.9°F | Resp 16 | Ht 65.0 in | Wt 165.0 lb

## 2019-05-19 DIAGNOSIS — Z6827 Body mass index (BMI) 27.0-27.9, adult: Secondary | ICD-10-CM | POA: Diagnosis not present

## 2019-05-19 DIAGNOSIS — R300 Dysuria: Secondary | ICD-10-CM

## 2019-05-19 DIAGNOSIS — Z79899 Other long term (current) drug therapy: Secondary | ICD-10-CM

## 2019-05-19 DIAGNOSIS — G43009 Migraine without aura, not intractable, without status migrainosus: Secondary | ICD-10-CM

## 2019-05-19 DIAGNOSIS — I10 Essential (primary) hypertension: Secondary | ICD-10-CM | POA: Diagnosis not present

## 2019-05-19 LAB — POCT URINALYSIS DIPSTICK
Bilirubin, UA: NEGATIVE
Blood, UA: NEGATIVE
Glucose, UA: NEGATIVE
Ketones, UA: NEGATIVE
Leukocytes, UA: NEGATIVE
Nitrite, UA: NEGATIVE
Protein, UA: NEGATIVE
Spec Grav, UA: 1.01 (ref 1.010–1.025)
Urobilinogen, UA: 0.2 E.U./dL
pH, UA: 5 (ref 5.0–8.0)

## 2019-05-19 LAB — POCT URINE DRUG SCREEN
POC Amphetamine UR: NOT DETECTED
POC BENZODIAZEPINES UR: NOT DETECTED
POC Barbiturate UR: NOT DETECTED
POC Cocaine UR: NOT DETECTED
POC Ecstasy UR: NOT DETECTED
POC Marijuana UR: NOT DETECTED
POC Methadone UR: NOT DETECTED
POC Methamphetamine UR: NOT DETECTED
POC Opiate Ur: NOT DETECTED
POC Oxycodone UR: NOT DETECTED
POC PHENCYCLIDINE UR: NOT DETECTED
POC TRICYCLICS UR: NOT DETECTED

## 2019-05-19 MED ORDER — QSYMIA 3.75-23 MG PO CP24: 1.0000 | ORAL_CAPSULE | Freq: Every day | ORAL | 0 refills | Status: DC

## 2019-05-19 MED ORDER — ZOLMITRIPTAN 5 MG PO TBDP: 5.0000 mg | ORAL_TABLET | ORAL | 3 refills | Status: DC | PRN

## 2019-05-19 NOTE — Progress Notes (Signed)
[facility] [place], [place] [address]  Internal MEDICINE  Office Visit Note  Patient Name: Kathy Mccormick  [id number]  [id number]  Date of Service: 05/31/2019  Chief Complaint  Patient presents with  . Hypertension  . Hyperlipidemia  . Gastroesophageal Reflux  . Weight Management  . Quality Metric Gaps    colonoscopy and pap smear     The patient is here for routine follow up visit. She is doing great with migraine headaches. She is taking emgality monthly. She is having maybe two per month when she was getting them three or four times per week. She is able to take Zomig which works well for her. She has not even had to try sample of ubrelvy at this point.        Current Medication: Outpatient Encounter Medications as of 05/19/2019  Medication Sig  . acyclovir (ZOVIRAX) 400 MG tablet Take 1 tablet (400 mg total) by mouth 2 (two) times daily.  Marland Kitchen ALPRAZolam (XANAX) 0.5 MG tablet Take 1 tablet (0.5 mg total) by mouth 2 (two) times daily as needed for anxiety.  Marland Kitchen atorvastatin (LIPITOR) 10 MG tablet Take 1 tablet (10 mg total) by mouth at bedtime.  . Azelaic Acid 15 % cream After skin is thoroughly washed and patted dry, gently but thoroughly massage a thin film of azelaic acid cream into the affected area twice daily, in the morning and evening.  Marland Kitchen CHERRY PO Take by mouth.    . desonide (DESOWEN) 0.05 % cream Apply 1 application topically 3 (three) times daily as needed.  . doxycycline (VIBRA-TABS) 100 MG tablet Take 1 tablet (100 mg total) by mouth daily.  . DULoxetine (CYMBALTA) 60 MG capsule Take 1 capsule po one to two times daliy  . fluticasone (FLONASE) 50 MCG/ACT nasal spray Place 1 spray into both nostrils daily.  . Galcanezumab-gnlm (EMGALITY) 120 MG/ML SOSY Inject 120 mg into the skin every 30 (thirty) days.  Marland Kitchen levothyroxine (SYNTHROID, LEVOTHROID) 50 MCG tablet Take 1 tablet (50 mcg total) by mouth daily before breakfast.  . losartan (COZAAR)  100 MG tablet Take 1 tablet (100 mg total) by mouth daily.  . meloxicam (MOBIC) 15 MG tablet Take 1 tablet (15 mg total) by mouth daily as needed.  . Multiple Vitamin (MULTIVITAMIN) tablet Take 1 tablet by mouth daily.    . NONFORMULARY OR COMPOUNDED ITEM Estradiol 0.02 % vaginal cream (1gram twice weekly)  . omeprazole (PRILOSEC) 40 MG capsule Take 1 tab po daily  . ondansetron (ZOFRAN) 4 MG tablet Take 1 tablet (4 mg total) by mouth every 8 (eight) hours as needed for nausea or vomiting.  Marland Kitchen Ubrogepant (UBRELVY) 50 MG TABS Take 1 tablet by mouth daily as needed.  . zolmitriptan (ZOMIG-ZMT) 5 MG disintegrating tablet Take 1 tablet (5 mg total) by mouth as needed for migraine. May repeat after 2-3 hours. Max dose is 2 tabs in 24 hrs.  . zolpidem (AMBIEN CR) 12.5 MG CR tablet Take 1 tablet (12.5 mg total) by mouth at bedtime as needed for sleep.  . [DISCONTINUED] Naltrexone-buPROPion HCl ER (CONTRAVE) 8-90 MG TB12 Take 2 tablets by mouth 2 (two) times daily.  . [DISCONTINUED] zolmitriptan (ZOMIG-ZMT) 5 MG disintegrating tablet Take 1 tablet (5 mg total) by mouth as needed for migraine. May repeat after 2-3 hours. Max dose is 2 tabs in 24 hrs.  . Phentermine-Topiramate (QSYMIA) 3.75-23 MG CP24 Take 1 tablet by mouth daily with breakfast.   No facility-administered encounter medications  on file as of 05/19/2019.     Surgical History: Past Surgical History:  Procedure Laterality Date  . BACK SURGERY    . CARPAL TUNNEL RELEASE    . CARPAL TUNNEL RELEASE Bilateral 2005  . CHOLECYSTECTOMY    . COLPOSCOPY    . OOPHORECTOMY     RSO,LSO  . PELVIC LAPAROSCOPY  2001,2004   DL X 2 RSO and LSO  . TONSILLECTOMY    . VAGINAL HYSTERECTOMY  2000   endometriosis    Medical History: Past Medical History:  Diagnosis Date  . Allergy    environmental  . CIN I (cervical intraepithelial neoplasia I)   . Endometriosis   . GERD (gastroesophageal reflux disease)   . Hyperlipidemia   . Hypertension   .  Migraine   . Migraines   . Neck pain   . STD (sexually transmitted disease)    HSV ll    Family History: Family History  Problem Relation Age of Onset  . Hypertension Father   . Diabetes Mother   . Hypertension Mother   . Uterine cancer Mother   . Liver disease Mother   . Hypertension Brother   . Breast cancer Maternal Aunt        Age 31's  . Aneurysm Maternal Aunt     Social History   Socioeconomic History  . Marital status: Married    Spouse name: Not on file  . Number of children: Not on file  . Years of education: Not on file  . Highest education level: Not on file  Occupational History  . Not on file  Social Needs  . Financial resource strain: Not on file  . Food insecurity    Worry: Not on file    Inability: Not on file  . Transportation needs    Medical: Not on file    Non-medical: Not on file  Tobacco Use  . Smoking status: Former Research scientist (life sciences)  . Smokeless tobacco: Never Used  Substance and Sexual Activity  . Alcohol use: No    Alcohol/week: 0.0 standard drinks  . Drug use: No  . Sexual activity: Not Currently    Birth control/protection: Surgical    Comment: 1st intercourse 63 yo-More than 5 partners  Lifestyle  . Physical activity    Days per week: Not on file    Minutes per session: Not on file  . Stress: Not on file  Relationships  . Social Herbalist on phone: Not on file    Gets together: Not on file    Attends religious service: Not on file    Active member of club or organization: Not on file    Attends meetings of clubs or organizations: Not on file    Relationship status: Not on file  . Intimate partner violence    Fear of current or ex partner: Not on file    Emotionally abused: Not on file    Physically abused: Not on file    Forced sexual activity: Not on file  Other Topics Concern  . Not on file  Social History Narrative  . Not on file      Review of Systems  Constitutional: Negative for activity change, fatigue and  unexpected weight change.  HENT: Negative for postnasal drip, rhinorrhea, sinus pressure, sore throat and voice change.   Respiratory: Negative for cough, chest tightness and wheezing.   Cardiovascular: Negative for chest pain and palpitations.  Gastrointestinal: Negative for nausea and vomiting.  Endocrine: Negative for  cold intolerance, heat intolerance, polydipsia and polyuria.       Stable thyroid  Musculoskeletal: Positive for arthralgias. Negative for myalgias.  Skin: Negative for rash.  Allergic/Immunologic: Positive for environmental allergies.  Neurological: Positive for headaches. Negative for dizziness.       Migraine headaches improved significantly. Generally having only 3 o r4 per month. Oral treatment has been effective.   Hematological: Negative for adenopathy.  Psychiatric/Behavioral: Negative for dysphoric mood. The patient is nervous/anxious.        Depression is stable.     Today's Vitals   05/19/19 1531  BP: 139/88  Pulse: 69  Resp: 16  Temp: 97.9 F (36.6 C)  SpO2: 99%  Weight: 165 lb (74.8 kg)  Height: 5\' 5"  (1.651 m)   Body mass index is 27.46 kg/m.  Physical Exam Vitals signs and nursing note reviewed.  Constitutional:      General: She is not in acute distress.    Appearance: Normal appearance. She is well-developed. She is not diaphoretic.  HENT:     Head: Normocephalic and atraumatic.     Nose: Nose normal.     Mouth/Throat:     Pharynx: No oropharyngeal exudate.  Eyes:     Extraocular Movements: Extraocular movements intact.     Conjunctiva/sclera: Conjunctivae normal.     Pupils: Pupils are equal, round, and reactive to light.  Neck:     Musculoskeletal: Normal range of motion and neck supple.     Thyroid: No thyromegaly.     Vascular: No carotid bruit or JVD.     Trachea: No tracheal deviation.  Cardiovascular:     Rate and Rhythm: Normal rate and regular rhythm.     Heart sounds: Normal heart sounds. No murmur. No friction rub. No  gallop.   Pulmonary:     Effort: Pulmonary effort is normal. No respiratory distress.     Breath sounds: Normal breath sounds. No wheezing or rales.  Chest:     Chest wall: No tenderness.  Abdominal:     General: Bowel sounds are normal.     Palpations: Abdomen is soft.     Tenderness: There is no abdominal tenderness.  Musculoskeletal: Normal range of motion.  Lymphadenopathy:     Cervical: No cervical adenopathy.  Skin:    General: Skin is warm and dry.     Capillary Refill: Capillary refill takes less than 2 seconds.  Neurological:     Mental Status: She is alert and oriented to person, place, and time.     Cranial Nerves: No cranial nerve deficit.     Coordination: Coordination normal.  Psychiatric:        Mood and Affect: Mood normal.        Behavior: Behavior normal.        Thought Content: Thought content normal.        Judgment: Judgment normal.    Assessment/Plan: 1. Essential (primary) hypertension Stable. Continue BP medication as prescribed.  2. Migraine without aura and without status migrainosus, not intractable Continue emgality once monthly. Use zomig as needed and as prescribed for acute migraine headaches.  - zolmitriptan (ZOMIG-ZMT) 5 MG disintegrating tablet; Take 1 tablet (5 mg total) by mouth as needed for migraine. May repeat after 2-3 hours. Max dose is 2 tabs in 24 hrs.  Dispense: 10 tablet; Refill: 3  3. BMI 27.0-27.9,adult Will try qsymia daily to help with weight management. Advised her to limit calorie intake to 1200 to 1500 calories per day and  incorporate exercise into daily routine.  - Phentermine-Topiramate (QSYMIA) 3.75-23 MG CP24; Take 1 tablet by mouth daily with breakfast.  Dispense: 30 capsule; Refill: 0  4. Encounter for long-term (current) use of medications - POCT Urine Drug Screen negative for all controlled medications.   5. Dysuria - POCT Urinalysis Dipstick negative for blood or evidence of infection.   General Counseling: Kimberlynn  verbalizes understanding of the findings of todays visit and agrees with plan of treatment. I have discussed any further diagnostic evaluation that may be needed or ordered today. We also reviewed her medications today. she has been encouraged to call the office with any questions or concerns that should arise related to todays visit.   There is a liability release in patients' chart. There has been a 10 minute discussion about the side effects including but not limited to elevated blood pressure, anxiety, lack of sleep and dry mouth. Pt understands and will like to start/continue on appetite suppressant at this time. There will be one month RX given at the time of visit with proper follow up. Nova diet plan with restricted calories is given to the pt. Pt understands and agrees with  plan of treatment  This patient was seen by Leretha Pol FNP Collaboration with Dr Lavera Guise as a part of collaborative care agreement  Orders Placed This Encounter  Procedures  . POCT Urine Drug Screen  . POCT Urinalysis Dipstick    Meds ordered this encounter  Medications  . zolmitriptan (ZOMIG-ZMT) 5 MG disintegrating tablet    Sig: Take 1 tablet (5 mg total) by mouth as needed for migraine. May repeat after 2-3 hours. Max dose is 2 tabs in 24 hrs.    Dispense:  10 tablet    Refill:  3    Order Specific Question:   Supervising Provider    Answer:   Lavera Guise [id number]  . Phentermine-Topiramate (QSYMIA) 3.75-23 MG CP24    Sig: Take 1 tablet by mouth daily with breakfast.    Dispense:  30 capsule    Refill:  0    Order Specific Question:   Supervising Provider    Answer:   Lavera Guise [id number]    Time spent: 57 Minutes      Dr Lavera Guise Internal medicine

## 2019-05-31 DIAGNOSIS — Z79899 Other long term (current) drug therapy: Secondary | ICD-10-CM | POA: Insufficient documentation

## 2019-05-31 DIAGNOSIS — R300 Dysuria: Secondary | ICD-10-CM | POA: Insufficient documentation

## 2019-06-01 ENCOUNTER — Other Ambulatory Visit: Payer: Self-pay

## 2019-06-01 DIAGNOSIS — F410 Panic disorder [episodic paroxysmal anxiety] without agoraphobia: Secondary | ICD-10-CM

## 2019-06-01 MED ORDER — OMEPRAZOLE 40 MG PO CPDR: DELAYED_RELEASE_CAPSULE | ORAL | 1 refills | Status: DC

## 2019-06-01 MED ORDER — DULOXETINE HCL 60 MG PO CPEP: ORAL_CAPSULE | ORAL | 1 refills | Status: DC

## 2019-06-01 MED ORDER — LEVOTHYROXINE SODIUM 50 MCG PO TABS: 50.0000 ug | ORAL_TABLET | Freq: Every day | ORAL | 1 refills | Status: DC

## 2019-06-12 ENCOUNTER — Telehealth: Payer: Self-pay

## 2019-06-12 NOTE — Telephone Encounter (Signed)
CONFIRMED AND SCREENED FOR 06-16-19 OV. 

## 2019-06-16 ENCOUNTER — Encounter: Payer: Self-pay | Admitting: Nurse Practitioner

## 2019-06-16 ENCOUNTER — Other Ambulatory Visit: Payer: Self-pay

## 2019-06-16 ENCOUNTER — Ambulatory Visit: Payer: 59 | Admitting: Nurse Practitioner

## 2019-06-16 VITALS — Ht 65.0 in | Wt 162.0 lb

## 2019-06-16 DIAGNOSIS — I10 Essential (primary) hypertension: Secondary | ICD-10-CM

## 2019-06-16 DIAGNOSIS — E039 Hypothyroidism, unspecified: Secondary | ICD-10-CM | POA: Diagnosis not present

## 2019-06-16 DIAGNOSIS — Z6827 Body mass index (BMI) 27.0-27.9, adult: Secondary | ICD-10-CM

## 2019-06-16 DIAGNOSIS — F410 Panic disorder [episodic paroxysmal anxiety] without agoraphobia: Secondary | ICD-10-CM

## 2019-06-16 DIAGNOSIS — G43009 Migraine without aura, not intractable, without status migrainosus: Secondary | ICD-10-CM | POA: Diagnosis not present

## 2019-06-16 DIAGNOSIS — F411 Generalized anxiety disorder: Secondary | ICD-10-CM

## 2019-06-16 MED ORDER — QSYMIA 3.75-23 MG PO CP24: 1.0000 | ORAL_CAPSULE | Freq: Every day | ORAL | 1 refills | Status: DC

## 2019-06-16 NOTE — Progress Notes (Signed)
[facility] [place], [place] [address]  Internal MEDICINE  Telephone Visit  Patient Name: Kathy Mccormick  [id number]  [id number]  Date of Service: 06/21/2019  I connected with the patient at 4:13pm by telephone and verified the patients identity using two identifiers.   I discussed the limitations, risks, security and privacy concerns of performing an evaluation and management service by telephone and the availability of in person appointments. I also discussed with the patient that there may be a patient responsible charge related to the service.  The patient expressed understanding and agrees to proceed.    Chief Complaint  Patient presents with  . Telephone Screen  . Telephone Assessment  . Medical Management of Chronic Issues    weight managment    The patient has been contacted via telephone for follow up visit due to concerns for spread of novel coronavirus. She presents for follow up of weight mamangement. Was started on qsyia 3.25/23,g daily. She states that she is tolerating the medication well. Helps to curb her appetite without negative side effects. States that she has lost five pounds according to her home scale. She states that she is doing well and has no new concerns or complaints today.       Current Medication: Outpatient Encounter Medications as of 06/16/2019  Medication Sig  . acyclovir (ZOVIRAX) 400 MG tablet Take 1 tablet (400 mg total) by mouth 2 (two) times daily.  Marland Kitchen ALPRAZolam (XANAX) 0.5 MG tablet Take 1 tablet (0.5 mg total) by mouth 2 (two) times daily as needed for anxiety.  Marland Kitchen atorvastatin (LIPITOR) 10 MG tablet Take 1 tablet (10 mg total) by mouth at bedtime.  . Azelaic Acid 15 % cream After skin is thoroughly washed and patted dry, gently but thoroughly massage a thin film of azelaic acid cream into the affected area twice daily, in the morning and evening.  Marland Kitchen CHERRY PO Take by mouth.    . desonide (DESOWEN) 0.05 % cream Apply  1 application topically 3 (three) times daily as needed.  . doxycycline (VIBRA-TABS) 100 MG tablet Take 1 tablet (100 mg total) by mouth daily.  . DULoxetine (CYMBALTA) 60 MG capsule Take 1 capsule po one to two times daliy  . fluticasone (FLONASE) 50 MCG/ACT nasal spray Place 1 spray into both nostrils daily.  . Galcanezumab-gnlm (EMGALITY) 120 MG/ML SOSY Inject 120 mg into the skin every 30 (thirty) days.  Marland Kitchen levothyroxine (SYNTHROID) 50 MCG tablet Take 1 tablet (50 mcg total) by mouth daily before breakfast.  . losartan (COZAAR) 100 MG tablet Take 1 tablet (100 mg total) by mouth daily.  . meloxicam (MOBIC) 15 MG tablet Take 1 tablet (15 mg total) by mouth daily as needed.  . Multiple Vitamin (MULTIVITAMIN) tablet Take 1 tablet by mouth daily.    . NONFORMULARY OR COMPOUNDED ITEM Estradiol 0.02 % vaginal cream (1gram twice weekly)  . omeprazole (PRILOSEC) 40 MG capsule Take 1 tab po daily  . ondansetron (ZOFRAN) 4 MG tablet Take 1 tablet (4 mg total) by mouth every 8 (eight) hours as needed for nausea or vomiting.  Marland Kitchen Phentermine-Topiramate (QSYMIA) 3.75-23 MG CP24 Take 1 tablet by mouth daily with breakfast.  . Ubrogepant (UBRELVY) 50 MG TABS Take 1 tablet by mouth daily as needed.  . zolmitriptan (ZOMIG-ZMT) 5 MG disintegrating tablet Take 1 tablet (5 mg total) by mouth as needed for migraine. May repeat after 2-3 hours. Max dose is 2 tabs in 24 hrs.  . zolpidem (  AMBIEN CR) 12.5 MG CR tablet Take 1 tablet (12.5 mg total) by mouth at bedtime as needed for sleep.  . [DISCONTINUED] Phentermine-Topiramate (QSYMIA) 3.75-23 MG CP24 Take 1 tablet by mouth daily with breakfast.   No facility-administered encounter medications on file as of 06/16/2019.    Surgical History: Past Surgical History:  Procedure Laterality Date  . BACK SURGERY    . CARPAL TUNNEL RELEASE    . CARPAL TUNNEL RELEASE Bilateral 2005  . CHOLECYSTECTOMY    . COLPOSCOPY    . OOPHORECTOMY     RSO,LSO  . PELVIC LAPAROSCOPY   2001,2004   DL X 2 RSO and LSO  . TONSILLECTOMY    . VAGINAL HYSTERECTOMY  2000   endometriosis    Medical History: Past Medical History:  Diagnosis Date  . Allergy    environmental  . CIN I (cervical intraepithelial neoplasia I)   . Endometriosis   . GERD (gastroesophageal reflux disease)   . Hyperlipidemia   . Hypertension   . Migraine   . Migraines   . Neck pain   . STD (sexually transmitted disease)    HSV ll    Family History: Family History  Problem Relation Age of Onset  . Hypertension Father   . Diabetes Mother   . Hypertension Mother   . Uterine cancer Mother   . Liver disease Mother   . Hypertension Brother   . Breast cancer Maternal Aunt        Age 30's  . Aneurysm Maternal Aunt     Social History   Socioeconomic History  . Marital status: Married    Spouse name: Not on file  . Number of children: Not on file  . Years of education: Not on file  . Highest education level: Not on file  Occupational History  . Not on file  Tobacco Use  . Smoking status: Former Research scientist (life sciences)  . Smokeless tobacco: Never Used  Substance and Sexual Activity  . Alcohol use: No    Alcohol/week: 0.0 standard drinks  . Drug use: No  . Sexual activity: Not Currently    Birth control/protection: Surgical    Comment: 1st intercourse 63 yo-More than 5 partners  Other Topics Concern  . Not on file  Social History Narrative  . Not on file   Social Determinants of Health   Financial Resource Strain:   . Difficulty of Paying Living Expenses: Not on file  Food Insecurity:   . Worried About Charity fundraiser in the Last Year: Not on file  . Ran Out of Food in the Last Year: Not on file  Transportation Needs:   . Lack of Transportation (Medical): Not on file  . Lack of Transportation (Non-Medical): Not on file  Physical Activity:   . Days of Exercise per Week: Not on file  . Minutes of Exercise per Session: Not on file  Stress:   . Feeling of Stress : Not on file  Social  Connections:   . Frequency of Communication with Friends and Family: Not on file  . Frequency of Social Gatherings with Friends and Family: Not on file  . Attends Religious Services: Not on file  . Active Member of Clubs or Organizations: Not on file  . Attends Archivist Meetings: Not on file  . Marital Status: Not on file  Intimate Partner Violence:   . Fear of Current or Ex-Partner: Not on file  . Emotionally Abused: Not on file  . Physically Abused: Not on  file  . Sexually Abused: Not on file      Review of Systems  Constitutional: Negative for activity change, fatigue and unexpected weight change.       Three pound weight loss since her last visit   HENT: Negative for postnasal drip, rhinorrhea, sinus pressure, sore throat and voice change.   Respiratory: Negative for cough, chest tightness and wheezing.   Cardiovascular: Negative for chest pain and palpitations.  Gastrointestinal: Negative for nausea and vomiting.  Endocrine: Negative for cold intolerance, heat intolerance, polydipsia and polyuria.       Stable thyroid  Musculoskeletal: Positive for arthralgias. Negative for myalgias.  Skin: Negative for rash.  Allergic/Immunologic: Positive for environmental allergies.  Neurological: Positive for headaches. Negative for dizziness.       Migraine headaches continue to be well managed.   Hematological: Negative for adenopathy.  Psychiatric/Behavioral: Negative for dysphoric mood. The patient is nervous/anxious.        Depression is stable.     Today's Vitals   06/16/19 1519  Weight: 162 lb (73.5 kg)  Height: 5\' 5"  (1.651 m)   Body mass index is 26.96 kg/m.  Observation/Objective:   The patient is alert and oriented. She is pleasant and answers all questions appropriately. Breathing is non-labored. She is in no acute distress at this time.    Assessment/Plan:  1. Essential (primary) hypertension Stable. Continue bp medication as prescribed   2.  Migraine without aura and without status migrainosus, not intractable Stable. Continue emgality once monthly. Use abortive therapy as needed and as prescribed   3. Hypothyroidism, unspecified type Stable.   4. BMI 27.0-27.9,adult Improving. Continue Qsymia daily. New prescription sent to her pharmacy today  - Phentermine-Topiramate (QSYMIA) 3.75-23 MG CP24; Take 1 tablet by mouth daily with breakfast.  Dispense: 30 capsule; Refill: 1  5. Generalized anxiety disorder with panic attacks Continue duloxetine every day. May take alprazolam as needed and as prescribed    General Counseling: Larayah verbalizes understanding of the findings of today's phone visit and agrees with plan of treatment. I have discussed any further diagnostic evaluation that may be needed or ordered today. We also reviewed her medications today. she has been encouraged to call the office with any questions or concerns that should arise related to todays visit.    There is a liability release in patients' chart. There has been a 10 minute discussion about the side effects including but not limited to elevated blood pressure, anxiety, lack of sleep and dry mouth. Pt understands and will like to start/continue on appetite suppressant at this time. There will be one month RX given at the time of visit with proper follow up. Nova diet plan with restricted calories is given to the pt. Pt understands and agrees with  plan of treatment  This patient was seen by Leretha Pol FNP Collaboration with Dr Lavera Guise as a part of collaborative care agreement  Meds ordered this encounter  Medications  . Phentermine-Topiramate (QSYMIA) 3.75-23 MG CP24    Sig: Take 1 tablet by mouth daily with breakfast.    Dispense:  30 capsule    Refill:  1    Order Specific Question:   Supervising Provider    Answer:   Lavera Guise [id number]    Time spent: 2 Minutes    Dr Lavera Guise Internal medicine

## 2019-06-21 DIAGNOSIS — G43009 Migraine without aura, not intractable, without status migrainosus: Secondary | ICD-10-CM | POA: Insufficient documentation

## 2019-07-07 ENCOUNTER — Other Ambulatory Visit: Payer: Self-pay | Admitting: Obstetrics and Gynecology

## 2019-07-07 DIAGNOSIS — Z1231 Encounter for screening mammogram for malignant neoplasm of breast: Secondary | ICD-10-CM

## 2019-08-13 ENCOUNTER — Telehealth: Payer: Self-pay

## 2019-08-13 NOTE — Telephone Encounter (Signed)
LMOM TO CONFIRM AND SCREEN FOR 08-17-19 OV.

## 2019-08-17 ENCOUNTER — Ambulatory Visit: Payer: 59 | Admitting: Nurse Practitioner

## 2019-08-18 ENCOUNTER — Ambulatory Visit: Payer: 59 | Admitting: Nurse Practitioner

## 2019-08-28 ENCOUNTER — Other Ambulatory Visit: Payer: Self-pay

## 2019-08-31 ENCOUNTER — Encounter: Payer: Self-pay | Admitting: Obstetrics and Gynecology

## 2019-08-31 ENCOUNTER — Ambulatory Visit (INDEPENDENT_AMBULATORY_CARE_PROVIDER_SITE_OTHER): Payer: 59 | Admitting: Obstetrics and Gynecology

## 2019-08-31 ENCOUNTER — Other Ambulatory Visit: Payer: Self-pay

## 2019-08-31 ENCOUNTER — Ambulatory Visit
Admission: RE | Admit: 2019-08-31 | Discharge: 2019-08-31 | Disposition: A | Discharge status: Home / Self Care | Payer: 59 | Source: Ambulatory Visit | Attending: Obstetrics and Gynecology | Admitting: Obstetrics and Gynecology

## 2019-08-31 VITALS — BP 118/74 | Ht 65.0 in | Wt 164.0 lb

## 2019-08-31 DIAGNOSIS — Z13220 Encounter for screening for lipoid disorders: Secondary | ICD-10-CM

## 2019-08-31 DIAGNOSIS — E039 Hypothyroidism, unspecified: Secondary | ICD-10-CM

## 2019-08-31 DIAGNOSIS — Z8639 Personal history of other endocrine, nutritional and metabolic disease: Secondary | ICD-10-CM

## 2019-08-31 DIAGNOSIS — Z01419 Encounter for gynecological examination (general) (routine) without abnormal findings: Secondary | ICD-10-CM | POA: Diagnosis not present

## 2019-08-31 DIAGNOSIS — Z1231 Encounter for screening mammogram for malignant neoplasm of breast: Secondary | ICD-10-CM

## 2019-08-31 LAB — CBC
HCT: 42.4 % (ref 35.0–45.0)
Hemoglobin: 14.5 g/dL (ref 11.7–15.5)
MCH: 31 pg (ref 27.0–33.0)
MCHC: 34.2 g/dL (ref 32.0–36.0)
MCV: 90.6 fL (ref 80.0–100.0)
MPV: 9.3 fL (ref 7.5–12.5)
Platelets: 360 10*3/uL (ref 140–400)
RBC: 4.68 10*6/uL (ref 3.80–5.10)
RDW: 12.5 % (ref 11.0–15.0)
WBC: 7.2 10*3/uL (ref 3.8–10.8)

## 2019-08-31 LAB — COMPREHENSIVE METABOLIC PANEL
AG Ratio: 2.3 (calc) (ref 1.0–2.5)
ALT: 19 U/L (ref 6–29)
AST: 25 U/L (ref 10–35)
Albumin: 4.4 g/dL (ref 3.6–5.1)
Alkaline phosphatase (APISO): 81 U/L (ref 37–153)
BUN: 16 mg/dL (ref 7–25)
CO2: 28 mmol/L (ref 20–32)
Calcium: 9.2 mg/dL (ref 8.6–10.4)
Chloride: 100 mmol/L (ref 98–110)
Creat: 0.76 mg/dL (ref 0.50–0.99)
Globulin: 1.9 g/dL (calc) (ref 1.9–3.7)
Glucose, Bld: 95 mg/dL (ref 65–99)
Potassium: 4.3 mmol/L (ref 3.5–5.3)
Sodium: 137 mmol/L (ref 135–146)
Total Bilirubin: 0.6 mg/dL (ref 0.2–1.2)
Total Protein: 6.3 g/dL (ref 6.1–8.1)

## 2019-08-31 LAB — LIPID PANEL
Cholesterol: 221 mg/dL — ABNORMAL HIGH (ref <200)
HDL: 87 mg/dL (ref >=50)
LDL Cholesterol (Calc): 117 mg/dL (calc) — ABNORMAL HIGH
Non-HDL Cholesterol (Calc): 134 mg/dL (calc) — ABNORMAL HIGH (ref <130)
Total CHOL/HDL Ratio: 2.5 (calc) (ref <5.0)
Triglycerides: 71 mg/dL (ref <150)

## 2019-08-31 LAB — TSH: TSH: 1.68 mIU/L (ref 0.40–4.50)

## 2019-08-31 LAB — VITAMIN D 25 HYDROXY (VIT D DEFICIENCY, FRACTURES): Vit D, 25-Hydroxy: 27 ng/mL — ABNORMAL LOW (ref 30–100)

## 2019-08-31 MED ORDER — ESTRADIOL 0.1 MG/GM VA CREA: TOPICAL_CREAM | VAGINAL | 9 refills | Status: DC

## 2019-08-31 NOTE — Progress Notes (Signed)
ELZORA BLASKI July 28, 1955 [id number]  SUBJECTIVE:  64 y.o. G1P0010 female for annual routine gynecologic exam. She has no gynecologic concerns.  Current Outpatient Medications  Medication Sig Dispense Refill  . acyclovir (ZOVIRAX) 400 MG tablet Take 1 tablet (400 mg total) by mouth 2 (two) times daily. 180 tablet 1  . ALPRAZolam (XANAX) 0.5 MG tablet Take 1 tablet (0.5 mg total) by mouth 2 (two) times daily as needed for anxiety. 60 tablet 2  . atorvastatin (LIPITOR) 10 MG tablet Take 1 tablet (10 mg total) by mouth at bedtime. 90 tablet 1  . Azelaic Acid 15 % cream After skin is thoroughly washed and patted dry, gently but thoroughly massage a thin film of azelaic acid cream into the affected area twice daily, in the morning and evening. 50 g 0  . CHERRY PO Take by mouth.      . desonide (DESOWEN) 0.05 % cream Apply 1 application topically 3 (three) times daily as needed.    . doxycycline (VIBRA-TABS) 100 MG tablet Take 1 tablet (100 mg total) by mouth daily. 90 tablet 1  . DULoxetine (CYMBALTA) 60 MG capsule Take 1 capsule po one to two times daliy 180 capsule 1  . fluticasone (FLONASE) 50 MCG/ACT nasal spray Place 1 spray into both nostrils daily. 48 g 1  . Galcanezumab-gnlm (EMGALITY) 120 MG/ML SOSY Inject 120 mg into the skin every 30 (thirty) days. 1 mL 5  . levothyroxine (SYNTHROID) 50 MCG tablet Take 1 tablet (50 mcg total) by mouth daily before breakfast. 90 tablet 1  . losartan (COZAAR) 100 MG tablet Take 1 tablet (100 mg total) by mouth daily. 90 tablet 1  . meloxicam (MOBIC) 15 MG tablet Take 1 tablet (15 mg total) by mouth daily as needed. 90 tablet 1  . Multiple Vitamin (MULTIVITAMIN) tablet Take 1 tablet by mouth daily.      . NONFORMULARY OR COMPOUNDED ITEM Estradiol 0.02 % vaginal cream (1gram twice weekly) 1 each 6  . omeprazole (PRILOSEC) 40 MG capsule Take 1 tab po daily 90 capsule 1  . ondansetron (ZOFRAN) 4 MG tablet Take 1 tablet (4 mg total) by mouth every 8 (eight)  hours as needed for nausea or vomiting. 8 tablet 0  . zolmitriptan (ZOMIG-ZMT) 5 MG disintegrating tablet Take 1 tablet (5 mg total) by mouth as needed for migraine. May repeat after 2-3 hours. Max dose is 2 tabs in 24 hrs. 10 tablet 3  . zolpidem (AMBIEN CR) 12.5 MG CR tablet Take 1 tablet (12.5 mg total) by mouth at bedtime as needed for sleep. 90 tablet 0   No current facility-administered medications for this visit.   Allergies: Codeine and Hydrocodone-acetaminophen  No LMP recorded. Patient has had a hysterectomy.  Past medical history,surgical history, problem list, medications, allergies, family history and social history were all reviewed and documented as reviewed in the EPIC chart.  ROS:  Feeling well. No dyspnea or chest pain on exertion.  No abdominal pain, change in bowel habits, black or bloody stools.  No urinary tract symptoms. GYN ROS:  no abnormal bleeding, pelvic pain or discharge, no breast pain or new or enlarging lumps on self exam. No neurological complaints.   OBJECTIVE:  BP 118/74   Ht 5\' 5"  (1.651 m)   Wt 164 lb (74.4 kg)   BMI 27.29 kg/m  The patient appears well, alert, oriented x 3, in no distress. ENT normal.  Neck supple. No cervical or supraclavicular adenopathy or thyromegaly.  Lungs are  clear, good air entry, no wheezes, rhonchi or rales. S1 and S2 normal, no murmurs, regular rate and rhythm.  Abdomen soft without tenderness, guarding, mass or organomegaly.  Neurological is normal, no focal findings.  BREAST EXAM: breasts appear normal, no suspicious masses, no skin or nipple changes or axillary nodes  PELVIC EXAM: VULVA: normal appearing vulva with no masses, tenderness or lesions, VAGINA: normal appearing vagina with normal color and discharge, no lesions, CERVIX: surgically absent, UTERUS: surgically absent, vaginal cuff well healed, ADNEXA: no masses  Chaperone: [name] present during the examination  ASSESSMENT:  64 y.o. G1P0010 here for  annual gynecologic exam  PLAN:   1. Postmenopause.   Prior TVH and subsequent RSO and LSO for endometriosis.  Vaginal estradiol cream 1 gm twice weekly refilled x1 year.  Risks of systemic absorption are reviewed. 2. Pap smear 2019.  Pap smear not repeated today.  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 Next Pap smear due 2022 following the current guidelines recommending the 3 year interval. 3. Mammogram just done today.  Normal breast exam today.  Continue annual mammogram when due. 4.  History of genital HSV.  She does use acyclovir for daily suppression.  She does get refills from her primary care provider. 5. Colonoscopy 2014.  Recommended that she follow up at the recommended interval.  6. DEXA 2013 was normal.  Repeat DEXA recommended now so she plans to schedule this. 7. Health maintenance.  She will proceed to lab today for routine screening blood work (lipids, CBC, CMP, TSH for history of hypothyroidism on replacement, and vitamin D level given risk factors for low bone density).   Return annually or sooner, prn.  Joseph Pierini MD  08/31/19

## 2019-08-31 NOTE — Patient Instructions (Signed)
We will plan to repeat the DEXA/bone density scan this year, please schedule.  Continue weight bearing exercise, and vitamin D/calcium intake.

## 2019-09-08 NOTE — Telephone Encounter (Signed)
Spoke with patient and read her the unread My Chart message with result info.

## 2019-09-18 ENCOUNTER — Telehealth: Payer: Self-pay

## 2019-09-18 NOTE — Telephone Encounter (Signed)
Denied ambien refills pt need appt for refills

## 2019-09-21 ENCOUNTER — Other Ambulatory Visit: Payer: Self-pay

## 2019-09-21 ENCOUNTER — Telehealth: Payer: Self-pay

## 2019-09-21 DIAGNOSIS — M150 Primary generalized (osteo)arthritis: Secondary | ICD-10-CM

## 2019-09-21 DIAGNOSIS — B009 Herpesviral infection, unspecified: Secondary | ICD-10-CM

## 2019-09-21 DIAGNOSIS — I10 Essential (primary) hypertension: Secondary | ICD-10-CM

## 2019-09-21 DIAGNOSIS — L719 Rosacea, unspecified: Secondary | ICD-10-CM

## 2019-09-21 MED ORDER — ACYCLOVIR 400 MG PO TABS: 400.0000 mg | ORAL_TABLET | Freq: Two times a day (BID) | ORAL | 0 refills | Status: DC

## 2019-09-21 MED ORDER — DOXYCYCLINE HYCLATE 100 MG PO TABS: 100.0000 mg | ORAL_TABLET | Freq: Every day | ORAL | 0 refills | Status: DC

## 2019-09-21 MED ORDER — MELOXICAM 15 MG PO TABS: 15.0000 mg | ORAL_TABLET | Freq: Every day | ORAL | 1 refills | Status: DC | PRN

## 2019-09-21 MED ORDER — LOSARTAN POTASSIUM 100 MG PO TABS: 100.0000 mg | ORAL_TABLET | Freq: Every day | ORAL | 1 refills | Status: DC

## 2019-09-21 NOTE — Telephone Encounter (Signed)
lmom pt need appt for further refills  

## 2019-09-29 ENCOUNTER — Other Ambulatory Visit: Payer: Self-pay

## 2019-10-01 ENCOUNTER — Other Ambulatory Visit: Payer: Self-pay

## 2019-10-01 DIAGNOSIS — G43009 Migraine without aura, not intractable, without status migrainosus: Secondary | ICD-10-CM

## 2019-10-01 MED ORDER — EMGALITY 120 MG/ML ~~LOC~~ SOSY: 120.0000 mg | PREFILLED_SYRINGE | SUBCUTANEOUS | 0 refills | Status: DC

## 2019-10-16 ENCOUNTER — Telehealth: Payer: Self-pay

## 2019-10-16 NOTE — Telephone Encounter (Signed)
Lmom to confirm and screen for 10-20-19 ov. 

## 2019-10-20 ENCOUNTER — Ambulatory Visit: Payer: 59 | Admitting: Nurse Practitioner

## 2019-10-20 ENCOUNTER — Encounter: Payer: Self-pay | Admitting: Nurse Practitioner

## 2019-10-20 ENCOUNTER — Telehealth: Payer: Self-pay

## 2019-10-20 ENCOUNTER — Other Ambulatory Visit: Payer: Self-pay

## 2019-10-20 VITALS — BP 140/80 | HR 89 | Temp 97.4°F | Resp 16 | Ht 65.0 in | Wt 170.4 lb

## 2019-10-20 DIAGNOSIS — I10 Essential (primary) hypertension: Secondary | ICD-10-CM

## 2019-10-20 DIAGNOSIS — L03116 Cellulitis of left lower limb: Secondary | ICD-10-CM | POA: Diagnosis not present

## 2019-10-20 DIAGNOSIS — E039 Hypothyroidism, unspecified: Secondary | ICD-10-CM

## 2019-10-20 DIAGNOSIS — B373 Candidiasis of vulva and vagina: Secondary | ICD-10-CM

## 2019-10-20 DIAGNOSIS — F411 Generalized anxiety disorder: Secondary | ICD-10-CM

## 2019-10-20 DIAGNOSIS — R300 Dysuria: Secondary | ICD-10-CM | POA: Diagnosis not present

## 2019-10-20 DIAGNOSIS — G43009 Migraine without aura, not intractable, without status migrainosus: Secondary | ICD-10-CM

## 2019-10-20 DIAGNOSIS — B3731 Acute candidiasis of vulva and vagina: Secondary | ICD-10-CM

## 2019-10-20 DIAGNOSIS — E782 Mixed hyperlipidemia: Secondary | ICD-10-CM

## 2019-10-20 DIAGNOSIS — F410 Panic disorder [episodic paroxysmal anxiety] without agoraphobia: Secondary | ICD-10-CM

## 2019-10-20 LAB — POCT URINALYSIS DIPSTICK
Bilirubin, UA: NEGATIVE
Blood, UA: NEGATIVE
Glucose, UA: NEGATIVE
Ketones, UA: NEGATIVE
Leukocytes, UA: NEGATIVE
Nitrite, UA: NEGATIVE
Protein, UA: NEGATIVE
Spec Grav, UA: <=1.005 — AB (ref 1.010–1.025)
Urobilinogen, UA: 0.2 E.U./dL
pH, UA: 5 (ref 5.0–8.0)

## 2019-10-20 MED ORDER — UBRELVY 100 MG PO TABS: 100.0000 mg | ORAL_TABLET | ORAL | 3 refills | Status: DC | PRN

## 2019-10-20 MED ORDER — ALPRAZOLAM 0.5 MG PO TABS: 0.5000 mg | ORAL_TABLET | Freq: Two times a day (BID) | ORAL | 2 refills | Status: DC | PRN

## 2019-10-20 MED ORDER — ZOLMITRIPTAN 5 MG PO TBDP: 5.0000 mg | ORAL_TABLET | ORAL | 3 refills | Status: DC | PRN

## 2019-10-20 MED ORDER — CEPHALEXIN 500 MG PO CAPS: 500.0000 mg | ORAL_CAPSULE | Freq: Three times a day (TID) | ORAL | 0 refills | Status: DC

## 2019-10-20 MED ORDER — FLUCONAZOLE 150 MG PO TABS: ORAL_TABLET | ORAL | 1 refills | Status: DC

## 2019-10-20 NOTE — Progress Notes (Signed)
[facility] [place], [place] [address]  Internal MEDICINE  Office Visit Note  Patient Name: Kathy Mccormick  [id number]  [id number]  Date of Service: 11/01/2019  Chief Complaint  Patient presents with  . Hypertension  . Migraine  . Hyperlipidemia  . Gastroesophageal Reflux  . Urinary Tract Infection    The patient is here for routine follow up visit. She states that she has been having some urinary frequency. She denies dysuria, abdominal pain, or flank pain. She does have skin lesion on the lower leg, resulting from accident in the home. The area is red, sore, and she has noted some clear drainage from the wound. She states that she is doing well. Migraines are very well controlled. Continues to take [employer]. Has very few breakthrough migraines. Blood pressure is well managed. She recently had routine fasting labs done. She does have very mild vitamin d deficiency. She also has mild elevation of LDL and total cholesterol. She continues to take atorvastatin 10mg  daily.       Current Medication: Outpatient Encounter Medications as of 10/20/2019  Medication Sig  . acyclovir (ZOVIRAX) 400 MG tablet Take 1 tablet (400 mg total) by mouth 2 (two) times daily.  Marland Kitchen ALPRAZolam (XANAX) 0.5 MG tablet Take 1 tablet (0.5 mg total) by mouth 2 (two) times daily as needed for anxiety.  Marland Kitchen atorvastatin (LIPITOR) 10 MG tablet Take 1 tablet (10 mg total) by mouth at bedtime.  . Azelaic Acid 15 % cream After skin is thoroughly washed and patted dry, gently but thoroughly massage a thin film of azelaic acid cream into the affected area twice daily, in the morning and evening.  Marland Kitchen CHERRY PO Take by mouth.    . desonide (DESOWEN) 0.05 % cream Apply 1 application topically 3 (three) times daily as needed.  . doxycycline (VIBRA-TABS) 100 MG tablet Take 1 tablet (100 mg total) by mouth daily.  . DULoxetine (CYMBALTA) 60 MG capsule Take 1 capsule po one to two times daliy  . estradiol  (ESTRACE) 0.1 MG/GM vaginal cream 1 gram in vagina two times a week  . fluticasone (FLONASE) 50 MCG/ACT nasal spray Place 1 spray into both nostrils daily.  . Galcanezumab-gnlm (EMGALITY) 120 MG/ML SOSY Inject 120 mg into the skin every 30 (thirty) days.  Marland Kitchen levothyroxine (SYNTHROID) 50 MCG tablet Take 1 tablet (50 mcg total) by mouth daily before breakfast.  . losartan (COZAAR) 100 MG tablet Take 1 tablet (100 mg total) by mouth daily.  . meloxicam (MOBIC) 15 MG tablet Take 1 tablet (15 mg total) by mouth daily as needed.  . Multiple Vitamin (MULTIVITAMIN) tablet Take 1 tablet by mouth daily.    Marland Kitchen omeprazole (PRILOSEC) 40 MG capsule Take 1 tab po daily  . zolmitriptan (ZOMIG-ZMT) 5 MG disintegrating tablet Take 1 tablet (5 mg total) by mouth as needed for migraine. May repeat after 2-3 hours. Max dose is 2 tabs in 24 hrs.  . zolpidem (AMBIEN CR) 12.5 MG CR tablet Take 1 tablet (12.5 mg total) by mouth at bedtime as needed for sleep.  . [DISCONTINUED] ALPRAZolam (XANAX) 0.5 MG tablet Take 1 tablet (0.5 mg total) by mouth 2 (two) times daily as needed for anxiety.  . [DISCONTINUED] zolmitriptan (ZOMIG-ZMT) 5 MG disintegrating tablet Take 1 tablet (5 mg total) by mouth as needed for migraine. May repeat after 2-3 hours. Max dose is 2 tabs in 24 hrs.  . cephALEXin (KEFLEX) 500 MG capsule Take 1 capsule (500 mg total)  by mouth 3 (three) times daily.  . fluconazole (DIFLUCAN) 150 MG tablet Take 1 tablet po once. May repeat dose in 3 days as needed for persistent symptoms.  Marland Kitchen Ubrogepant (UBRELVY) 100 MG TABS Take 100 mg by mouth as needed.  . [DISCONTINUED] NONFORMULARY OR COMPOUNDED ITEM Estradiol 0.02 % vaginal cream (1gram twice weekly) (Patient not taking: Reported on 10/20/2019)  . [DISCONTINUED] ondansetron (ZOFRAN) 4 MG tablet Take 1 tablet (4 mg total) by mouth every 8 (eight) hours as needed for nausea or vomiting. (Patient not taking: Reported on 10/20/2019)   No facility-administered encounter  medications on file as of 10/20/2019.    Surgical History: Past Surgical History:  Procedure Laterality Date  . BACK SURGERY    . CARPAL TUNNEL RELEASE    . CARPAL TUNNEL RELEASE Bilateral 2005  . CHOLECYSTECTOMY    . COLPOSCOPY    . OOPHORECTOMY     RSO,LSO  . PELVIC LAPAROSCOPY  2001,2004   DL X 2 RSO and LSO  . TONSILLECTOMY    . VAGINAL HYSTERECTOMY  2000   endometriosis    Medical History: Past Medical History:  Diagnosis Date  . Allergy    environmental  . CIN I (cervical intraepithelial neoplasia I)   . Endometriosis   . GERD (gastroesophageal reflux disease)   . Hyperlipidemia   . Hypertension   . Migraine   . Migraines   . Neck pain   . STD (sexually transmitted disease)    HSV ll    Family History: Family History  Problem Relation Age of Onset  . Hypertension Father   . Diabetes Mother   . Hypertension Mother   . Uterine cancer Mother   . Liver disease Mother   . Hypertension Brother   . Heart Problems Brother   . Breast cancer Maternal Aunt        Age 70's  . Aneurysm Maternal Aunt     Social History   Socioeconomic History  . Marital status: Married    Spouse name: Not on file  . Number of children: Not on file  . Years of education: Not on file  . Highest education level: Not on file  Occupational History  . Not on file  Tobacco Use  . Smoking status: Former Research scientist (life sciences)  . Smokeless tobacco: Never Used  Substance and Sexual Activity  . Alcohol use: No    Alcohol/week: 0.0 standard drinks  . Drug use: No  . Sexual activity: Not Currently    Birth control/protection: Surgical    Comment: 1st intercourse 64 yo-More than 5 partners  Other Topics Concern  . Not on file  Social History Narrative  . Not on file   Social Determinants of Health   Financial Resource Strain:   . Difficulty of Paying Living Expenses:   Food Insecurity:   . Worried About Charity fundraiser in the Last Year:   . Arboriculturist in the Last Year:    Transportation Needs:   . Film/video editor (Medical):   Marland Kitchen Lack of Transportation (Non-Medical):   Physical Activity:   . Days of Exercise per Week:   . Minutes of Exercise per Session:   Stress:   . Feeling of Stress :   Social Connections:   . Frequency of Communication with Friends and Family:   . Frequency of Social Gatherings with Friends and Family:   . Attends Religious Services:   . Active Member of Clubs or Organizations:   . Attends  Club or Organization Meetings:   Marland Kitchen Marital Status:   Intimate Partner Violence:   . Fear of Current or Ex-Partner:   . Emotionally Abused:   Marland Kitchen Physically Abused:   . Sexually Abused:       Review of Systems  Constitutional: Negative for activity change, fatigue and unexpected weight change.  HENT: Negative for postnasal drip, rhinorrhea, sinus pressure, sore throat and voice change.   Respiratory: Negative for cough, chest tightness and wheezing.   Cardiovascular: Negative for chest pain and palpitations.  Gastrointestinal: Negative for nausea and vomiting.  Endocrine: Negative for cold intolerance, heat intolerance, polydipsia and polyuria.       Recent thyroid panel is normal.   Genitourinary: Positive for frequency. Negative for dysuria, flank pain and hematuria.  Musculoskeletal: Positive for arthralgias. Negative for myalgias.  Skin: Negative for rash.       Wound of left lower leg. Tender and red. Small amount of drainage has been noted from the wound.   Allergic/Immunologic: Positive for environmental allergies.  Neurological: Positive for headaches. Negative for dizziness.       Migraine headaches continue to be well managed.   Hematological: Negative for adenopathy.  Psychiatric/Behavioral: Negative for dysphoric mood. The patient is nervous/anxious.        Depression is stable.     Today's Vitals   10/20/19 1531  BP: 140/80  Pulse: 89  Resp: 16  Temp: (!) 97.4 F (36.3 C)  SpO2: 99%  Weight: 170 lb 6.4 oz  (77.3 kg)  Height: 5\' 5"  (1.651 m)   Body mass index is 28.36 kg/m.  Physical Exam Vitals and nursing note reviewed.  Constitutional:      General: She is not in acute distress.    Appearance: Normal appearance. She is well-developed. She is not diaphoretic.  HENT:     Head: Normocephalic and atraumatic.     Nose: Nose normal.     Mouth/Throat:     Pharynx: No oropharyngeal exudate.  Eyes:     Extraocular Movements: Extraocular movements intact.     Conjunctiva/sclera: Conjunctivae normal.     Pupils: Pupils are equal, round, and reactive to light.  Neck:     Thyroid: No thyromegaly.     Vascular: No carotid bruit or JVD.     Trachea: No tracheal deviation.  Cardiovascular:     Rate and Rhythm: Normal rate and regular rhythm.     Heart sounds: Normal heart sounds. No murmur. No friction rub. No gallop.   Pulmonary:     Effort: Pulmonary effort is normal. No respiratory distress.     Breath sounds: Normal breath sounds. No wheezing or rales.  Chest:     Chest wall: No tenderness.  Abdominal:     General: Bowel sounds are normal.     Palpations: Abdomen is soft.     Tenderness: There is no abdominal tenderness.  Genitourinary:    Comments: Urine sample is negative for infection or other abnormalities.  Musculoskeletal:        General: Normal range of motion.     Cervical back: Normal range of motion and neck supple.  Lymphadenopathy:     Cervical: No cervical adenopathy.  Skin:    General: Skin is warm and dry.     Capillary Refill: Capillary refill takes less than 2 seconds.     Comments: Circular shaped wound of left lower leg. Red with small amount of necrotic tissue around circumference of wound. There is some cellulitis present. Area  is red, warm, and tender. Small amount of serosanguinous fluid present on current dressing.   Neurological:     Mental Status: She is alert and oriented to person, place, and time.     Cranial Nerves: No cranial nerve deficit.      Coordination: Coordination normal.  Psychiatric:        Mood and Affect: Mood normal.        Behavior: Behavior normal.        Thought Content: Thought content normal.        Judgment: Judgment normal.    Assessment/Plan: 1. Cellulitis of left lower leg Start cephalexin 500mg  three times daily for next 10 days. Advised she keep wound clean and dry, covering with clean and dry dressing twice daily.  - cephALEXin (KEFLEX) 500 MG capsule; Take 1 capsule (500 mg total) by mouth 3 (three) times daily.  Dispense: 30 capsule; Refill: 0  2. Candidal vaginitis Diflucan should be taken once if yeast infection develops. Repeat dose in three days if symptoms persist.  - fluconazole (DIFLUCAN) 150 MG tablet; Take 1 tablet po once. May repeat dose in 3 days as needed for persistent symptoms.  Dispense: 3 tablet; Refill: 1  3. Acquired hypothyroidism Recent thyroid panel stable. Continue levothyroxine as prescribed   4. Essential (primary) hypertension Stable. Continue bp mediation as prescribed   5. Mixed hyperlipidemia Mild elevation of LDL and total cholesterol. Continue atorvastatin as prescribed   6. Migraine without aura and without status migrainosus, not intractable Well managed. Continue emgality monthly. May take ubrelvy or zomig as needed and as prescribed for acute migraines.  - zolmitriptan (ZOMIG-ZMT) 5 MG disintegrating tablet; Take 1 tablet (5 mg total) by mouth as needed for migraine. May repeat after 2-3 hours. Max dose is 2 tabs in 24 hrs.  Dispense: 10 tablet; Refill: 3 - Ubrogepant (UBRELVY) 100 MG TABS; Take 100 mg by mouth as needed.  Dispense: 10 tablet; Refill: 3  7. Generalized anxiety disorder with panic attacks May take alprazolam 0.5mg  up to twice daily as needed for acute anxiety. New prescription sent to her pharmacy.  - ALPRAZolam (XANAX) 0.5 MG tablet; Take 1 tablet (0.5 mg total) by mouth 2 (two) times daily as needed for anxiety.  Dispense: 60 tablet; Refill:  2  8. Dysuria - POCT Urinalysis Dipstick negative for infection or other abnormalities.   General Counseling: Zaide verbalizes understanding of the findings of todays visit and agrees with plan of treatment. I have discussed any further diagnostic evaluation that may be needed or ordered today. We also reviewed her medications today. she has been encouraged to call the office with any questions or concerns that should arise related to todays visit.  This patient was seen by Leretha Pol FNP Collaboration with Dr Lavera Guise as a part of collaborative care agreement  Orders Placed This Encounter  Procedures  . POCT Urinalysis Dipstick    Meds ordered this encounter  Medications  . cephALEXin (KEFLEX) 500 MG capsule    Sig: Take 1 capsule (500 mg total) by mouth 3 (three) times daily.    Dispense:  30 capsule    Refill:  0    Order Specific Question:   Supervising Provider    Answer:   Lavera Guise [id number]  . fluconazole (DIFLUCAN) 150 MG tablet    Sig: Take 1 tablet po once. May repeat dose in 3 days as needed for persistent symptoms.    Dispense:  3 tablet  Refill:  1    Order Specific Question:   Supervising Provider    Answer:   Lavera Guise [id number]  . ALPRAZolam (XANAX) 0.5 MG tablet    Sig: Take 1 tablet (0.5 mg total) by mouth 2 (two) times daily as needed for anxiety.    Dispense:  60 tablet    Refill:  2    Order Specific Question:   Supervising Provider    Answer:   Lavera Guise [id number]  . zolmitriptan (ZOMIG-ZMT) 5 MG disintegrating tablet    Sig: Take 1 tablet (5 mg total) by mouth as needed for migraine. May repeat after 2-3 hours. Max dose is 2 tabs in 24 hrs.    Dispense:  10 tablet    Refill:  3    Order Specific Question:   Supervising Provider    Answer:   Lavera Guise [id number]  . Ubrogepant (UBRELVY) 100 MG TABS    Sig: Take 100 mg by mouth as needed.    Dispense:  10 tablet    Refill:  3    Patient given copay assistance card.    Order Specific  Question:   Supervising Provider    Answer:   Lavera Guise [id number]    Total time spent: 30 Minutes   Time spent includes review of chart, medications, test results, and follow up plan with the patient.      Dr Lavera Guise Internal medicine

## 2019-10-20 NOTE — Telephone Encounter (Signed)
Confirmed appointment on 10/20/2019 and screened for [email or web address] 

## 2019-11-01 DIAGNOSIS — B3731 Acute candidiasis of vulva and vagina: Secondary | ICD-10-CM | POA: Insufficient documentation

## 2019-11-01 DIAGNOSIS — L03116 Cellulitis of left lower limb: Secondary | ICD-10-CM | POA: Insufficient documentation

## 2019-11-01 DIAGNOSIS — B373 Candidiasis of vulva and vagina: Secondary | ICD-10-CM | POA: Insufficient documentation

## 2019-11-03 ENCOUNTER — Other Ambulatory Visit: Payer: Self-pay

## 2019-11-03 DIAGNOSIS — G43009 Migraine without aura, not intractable, without status migrainosus: Secondary | ICD-10-CM

## 2019-11-03 MED ORDER — EMGALITY 120 MG/ML ~~LOC~~ SOSY: 120.0000 mg | PREFILLED_SYRINGE | SUBCUTANEOUS | 0 refills | Status: DC

## 2019-12-04 ENCOUNTER — Other Ambulatory Visit: Payer: Self-pay

## 2019-12-04 DIAGNOSIS — G43009 Migraine without aura, not intractable, without status migrainosus: Secondary | ICD-10-CM

## 2019-12-04 MED ORDER — EMGALITY 120 MG/ML ~~LOC~~ SOSY: 120.0000 mg | PREFILLED_SYRINGE | SUBCUTANEOUS | 5 refills | Status: DC

## 2019-12-23 ENCOUNTER — Telehealth: Payer: Self-pay

## 2019-12-23 NOTE — Telephone Encounter (Signed)
Authorization for Kathy Mccormick 100MG  tablets has been approved from 12/23/2019 through 12/22/2020 SL

## 2020-01-07 ENCOUNTER — Other Ambulatory Visit: Payer: Self-pay

## 2020-01-07 DIAGNOSIS — F5101 Primary insomnia: Secondary | ICD-10-CM

## 2020-01-07 DIAGNOSIS — B009 Herpesviral infection, unspecified: Secondary | ICD-10-CM

## 2020-01-07 DIAGNOSIS — L719 Rosacea, unspecified: Secondary | ICD-10-CM

## 2020-01-07 DIAGNOSIS — F411 Generalized anxiety disorder: Secondary | ICD-10-CM

## 2020-01-07 MED ORDER — OMEPRAZOLE 40 MG PO CPDR: DELAYED_RELEASE_CAPSULE | ORAL | 1 refills | Status: DC

## 2020-01-07 MED ORDER — DULOXETINE HCL 60 MG PO CPEP: ORAL_CAPSULE | ORAL | 1 refills | Status: DC

## 2020-01-07 MED ORDER — DOXYCYCLINE HYCLATE 100 MG PO TABS: 100.0000 mg | ORAL_TABLET | Freq: Every day | ORAL | 0 refills | Status: DC

## 2020-01-07 MED ORDER — ACYCLOVIR 400 MG PO TABS: 400.0000 mg | ORAL_TABLET | Freq: Two times a day (BID) | ORAL | 0 refills | Status: DC

## 2020-01-07 MED ORDER — ZOLPIDEM TARTRATE ER 12.5 MG PO TBCR: 12.5000 mg | EXTENDED_RELEASE_TABLET | Freq: Every evening | ORAL | 0 refills | Status: DC | PRN

## 2020-01-07 MED ORDER — LEVOTHYROXINE SODIUM 50 MCG PO TABS: 50.0000 ug | ORAL_TABLET | Freq: Every day | ORAL | 1 refills | Status: DC

## 2020-01-12 ENCOUNTER — Encounter: Payer: Self-pay | Admitting: Dermatology

## 2020-01-12 ENCOUNTER — Ambulatory Visit: Payer: 59 | Admitting: Dermatology

## 2020-01-12 ENCOUNTER — Other Ambulatory Visit: Payer: Self-pay

## 2020-01-12 DIAGNOSIS — L720 Epidermal cyst: Secondary | ICD-10-CM | POA: Diagnosis not present

## 2020-01-12 DIAGNOSIS — H01134 Eczematous dermatitis of left upper eyelid: Secondary | ICD-10-CM | POA: Diagnosis not present

## 2020-01-12 DIAGNOSIS — L820 Inflamed seborrheic keratosis: Secondary | ICD-10-CM

## 2020-01-12 DIAGNOSIS — H01131 Eczematous dermatitis of right upper eyelid: Secondary | ICD-10-CM

## 2020-01-12 MED ORDER — TACROLIMUS 0.1 % EX OINT: TOPICAL_OINTMENT | CUTANEOUS | 2 refills | Status: DC

## 2020-01-12 NOTE — Patient Instructions (Addendum)

## 2020-01-12 NOTE — Progress Notes (Signed)
   Follow-Up Visit   Subjective  Kathy Mccormick is a 64 y.o. female who presents for the following: Area of concern.  Patient presents today with area of concern on left eye,Upper back,  and on her arms and has been using desonide cream on left arm.  She also has had an itchy rash on her eyelids for 6 months.  It has improved when she started using desonide.  She does not use any eye make-up, no new soap or shampoo, she doesn't wear nail polish.  The following portions of the chart were reviewed this encounter and updated as appropriate:      Review of Systems:  No other skin or systemic complaints except as noted in HPI or Assessment and Plan.  Objective  Well appearing patient in no apparent distress; mood and affect are within normal limits.  A focused examination was performed including left eye, left arm, and upper back. Relevant physical exam findings are noted in the Assessment and Plan.  Objective  Left Medial Canthus: 3 mm indistinct firm papule with erythema  Objective  Bilateral eyelids: Pink patches upper eyelids- rash has improved since she started using a desonide   Objective  Left Forearm x 7 (7), Left Upper Arm, Right Forearm x 2 (2), Right Spinal Mid Upper Back: Erythematous keratotic waxy stuck-on papules   Assessment & Plan  Milia Left Medial Canthus  Inflamed Milia vs Seb hyperplasia Benign, observe.   Apply tacrolimus ointment bid to aa  Eczematous dermatitis of upper eyelids of both eyes Bilateral eyelids  Improving with Desonide cream Start Tacrolimus 0.1% ointment apply to eyelids once to twice daily  Can use OTC vanicream hc as needed for itch and Vanicream ointment around eyes prn dryness  tacrolimus (PROTOPIC) 0.1 % ointment - Bilateral eyelids  Inflamed seborrheic keratosis (11) Left Upper Arm; Left Forearm x 7 (7); Right Forearm x 2 (2); Right Spinal Mid Upper Back  Cryotherapy today Prior to procedure, discussed risks of blister  formation, small wound, skin dyspigmentation, or rare scar following cryotherapy.   Recheck left upper arm on F/U   Destruction of lesion - Left Forearm x 7, Left Upper Arm, Right Forearm x 2, Right Spinal Mid Upper Back  Destruction method: cryotherapy   Informed consent: discussed and consent obtained   Lesion destroyed using liquid nitrogen: Yes   Region frozen until ice ball extended beyond lesion: Yes   Outcome: patient tolerated procedure well with no complications   Post-procedure details: wound care instructions given    Return in about 6 weeks (around 02/23/2020) for eyelid derm.  Marene Lenz, CMA, am acting as scribe for Brendolyn Patty, MD .  Documentation: I have reviewed the above documentation for accuracy and completeness, and I agree with the above.  Brendolyn Patty MD

## 2020-01-21 IMAGING — MG DIGITAL SCREENING BILATERAL MAMMOGRAM WITH CAD
4 series · 4 of 4 positions shown · non-contrast
Comparison: Previous exam(s).

CLINICAL DATA: Screening.

EXAM:
DIGITAL SCREENING BILATERAL MAMMOGRAM WITH CAD

[R MLO]
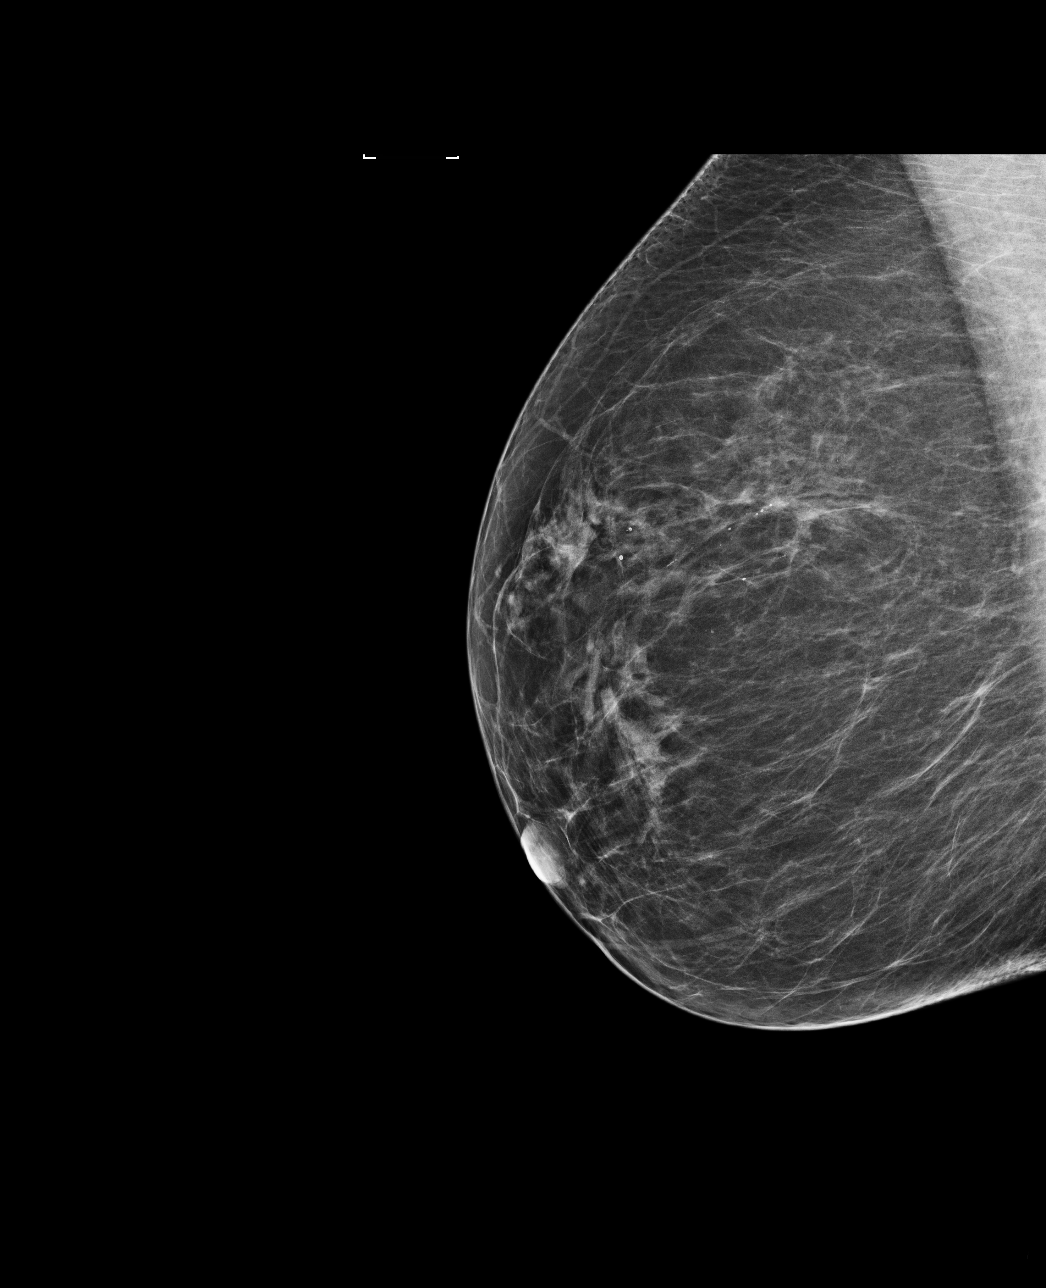

[L CC]
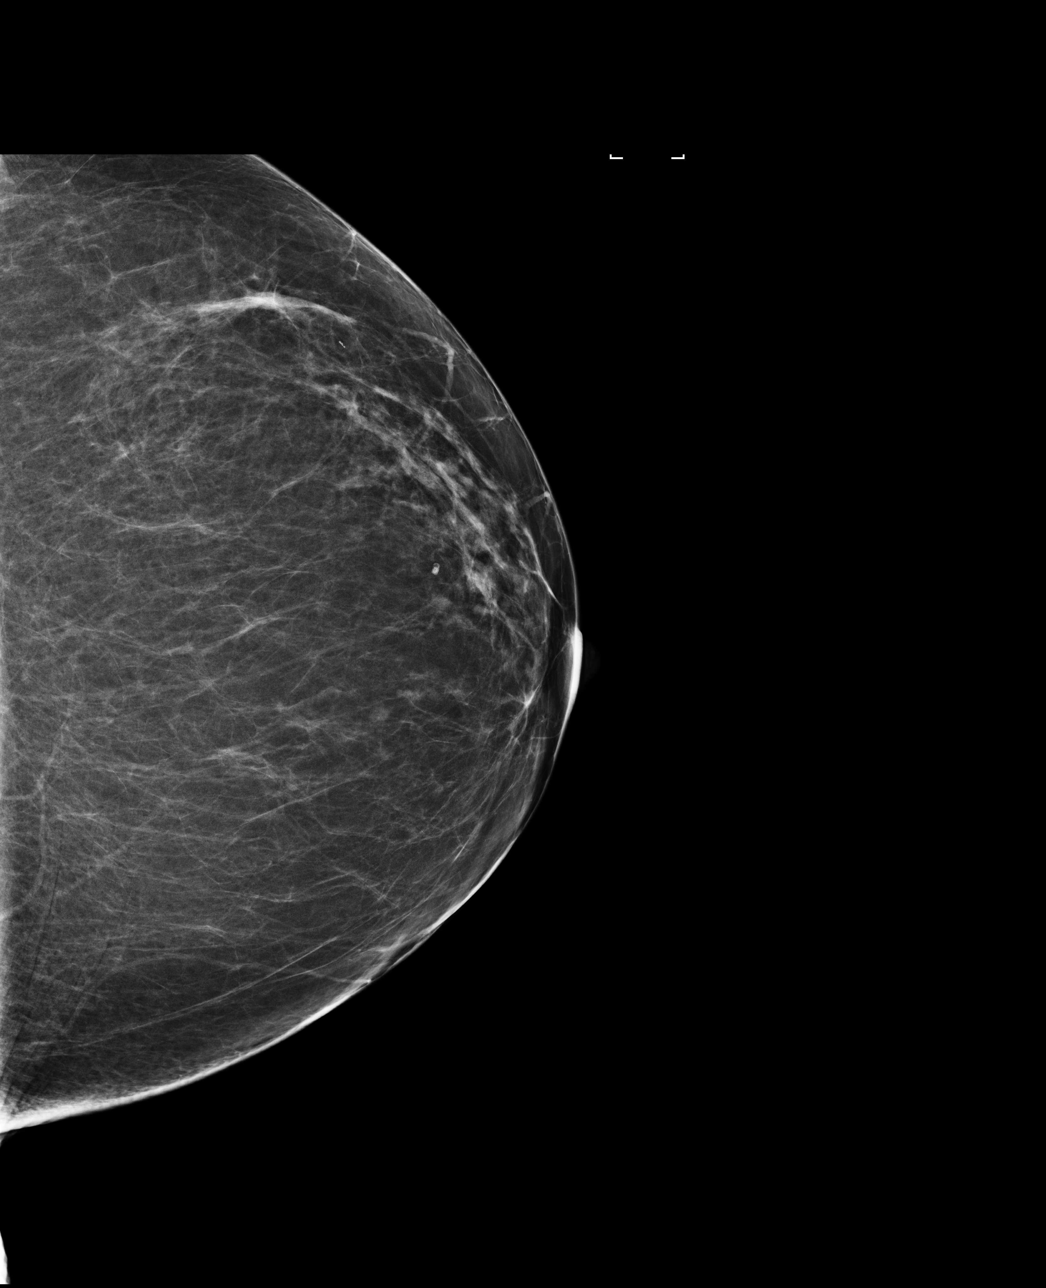

[R CC]
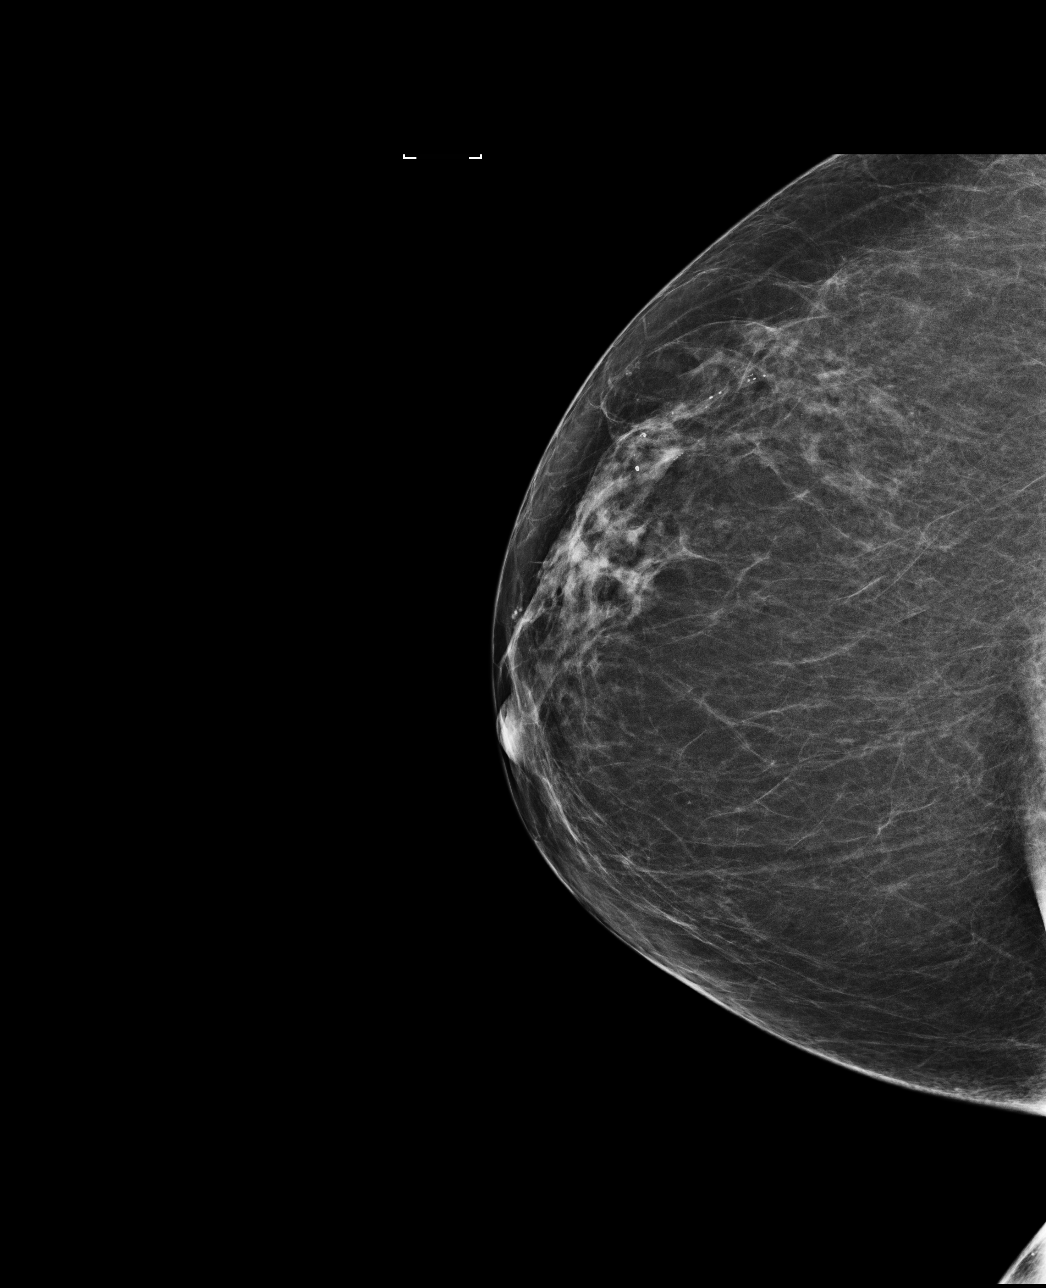

[L MLO]
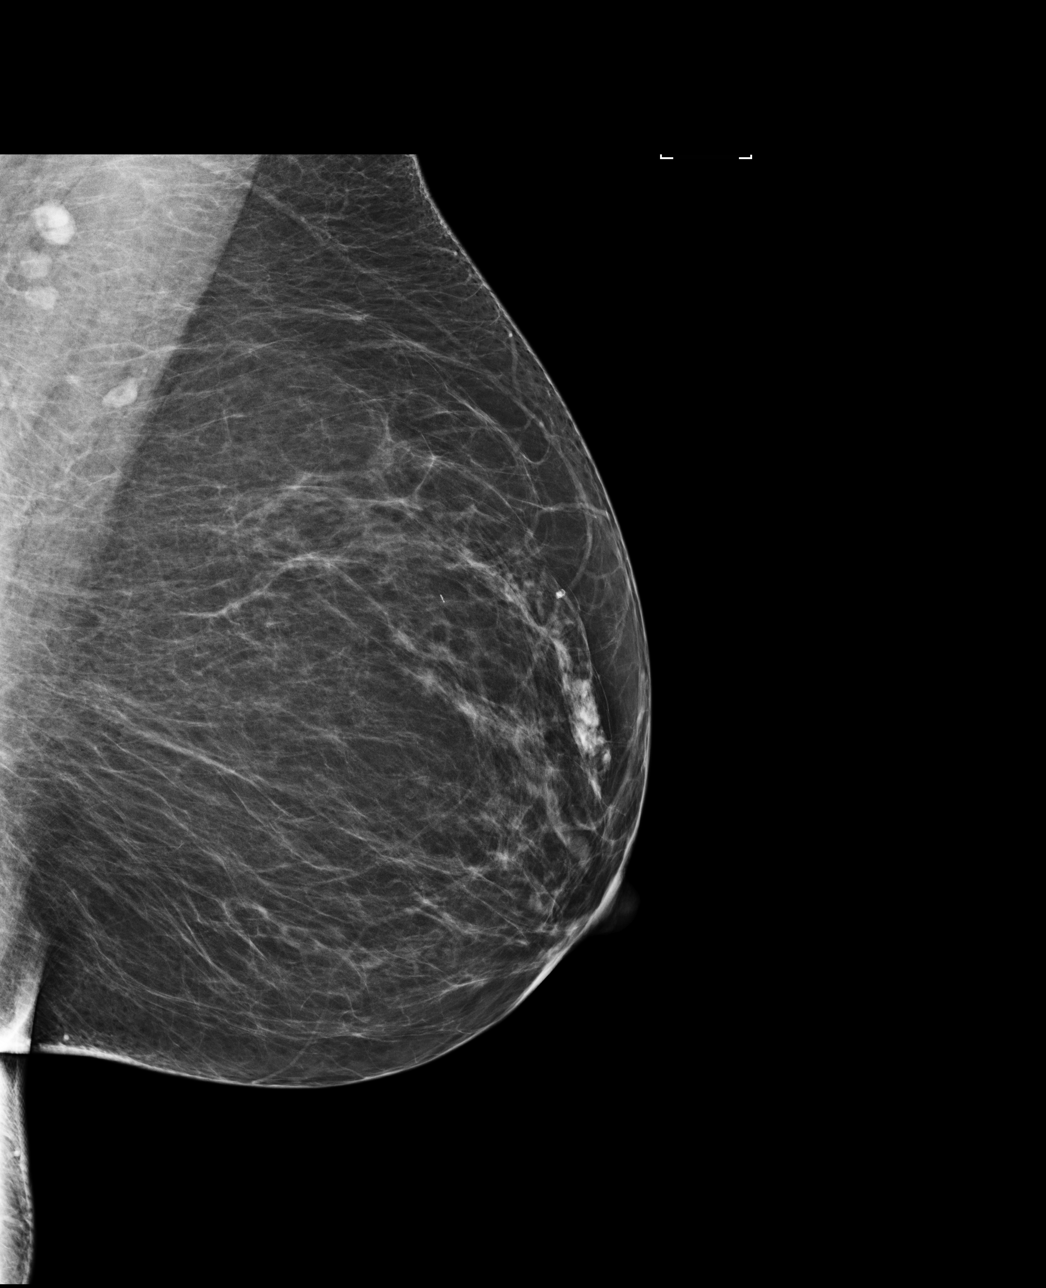

[4 of 4 positions shown; findings below may reference images not displayed]

ACR Breast Density Category b: There are scattered areas of
fibroglandular density.
FINDINGS: There are no findings suspicious for malignancy. Images were
processed with CAD.
IMPRESSION: No mammographic evidence of malignancy. A result letter of this
screening mammogram will be mailed directly to the patient.

RECOMMENDATION:
Screening mammogram in one year. (Code:AS-G-LCT)

BI-RADS CATEGORY  1: Negative.

## 2020-01-25 ENCOUNTER — Telehealth: Payer: Self-pay

## 2020-01-25 NOTE — Telephone Encounter (Signed)
done

## 2020-01-25 NOTE — Telephone Encounter (Signed)
Hey. Can you find out what is going on with PA for this medication for patient? Thanks.

## 2020-01-25 NOTE — Telephone Encounter (Signed)
Do I need to resend the prescription?

## 2020-01-26 NOTE — Telephone Encounter (Signed)
No I don't think so

## 2020-02-16 ENCOUNTER — Telehealth: Payer: Self-pay

## 2020-02-16 NOTE — Telephone Encounter (Signed)
Confirmed and screened for 02-18-20 ov.

## 2020-02-17 ENCOUNTER — Ambulatory Visit: Payer: 59 | Admitting: Dermatology

## 2020-02-18 ENCOUNTER — Ambulatory Visit: Payer: 59 | Admitting: Nurse Practitioner

## 2020-04-06 ENCOUNTER — Other Ambulatory Visit: Payer: Self-pay

## 2020-04-06 DIAGNOSIS — E782 Mixed hyperlipidemia: Secondary | ICD-10-CM

## 2020-04-06 DIAGNOSIS — B009 Herpesviral infection, unspecified: Secondary | ICD-10-CM

## 2020-04-06 DIAGNOSIS — M150 Primary generalized (osteo)arthritis: Secondary | ICD-10-CM

## 2020-04-06 DIAGNOSIS — I10 Essential (primary) hypertension: Secondary | ICD-10-CM

## 2020-04-06 DIAGNOSIS — L719 Rosacea, unspecified: Secondary | ICD-10-CM

## 2020-04-06 MED ORDER — ATORVASTATIN CALCIUM 10 MG PO TABS: 10.0000 mg | ORAL_TABLET | Freq: Every day | ORAL | 1 refills | Status: DC

## 2020-04-06 MED ORDER — LOSARTAN POTASSIUM 100 MG PO TABS: 100.0000 mg | ORAL_TABLET | Freq: Every day | ORAL | 1 refills | Status: DC

## 2020-04-06 MED ORDER — DOXYCYCLINE HYCLATE 100 MG PO TABS: 100.0000 mg | ORAL_TABLET | Freq: Every day | ORAL | 1 refills | Status: DC

## 2020-04-06 MED ORDER — MELOXICAM 15 MG PO TABS: 15.0000 mg | ORAL_TABLET | Freq: Every day | ORAL | 1 refills | Status: DC | PRN

## 2020-04-06 MED ORDER — ACYCLOVIR 400 MG PO TABS: 400.0000 mg | ORAL_TABLET | Freq: Two times a day (BID) | ORAL | 1 refills | Status: DC

## 2020-06-01 ENCOUNTER — Other Ambulatory Visit: Payer: Self-pay

## 2020-06-01 DIAGNOSIS — G43009 Migraine without aura, not intractable, without status migrainosus: Secondary | ICD-10-CM

## 2020-06-01 MED ORDER — ZOLMITRIPTAN 5 MG PO TBDP: 5.0000 mg | ORAL_TABLET | ORAL | 3 refills | Status: DC | PRN

## 2020-06-20 ENCOUNTER — Other Ambulatory Visit: Payer: Self-pay | Admitting: Obstetrics and Gynecology

## 2020-06-20 ENCOUNTER — Other Ambulatory Visit: Payer: Self-pay

## 2020-06-20 DIAGNOSIS — Z1231 Encounter for screening mammogram for malignant neoplasm of breast: Secondary | ICD-10-CM

## 2020-06-20 DIAGNOSIS — G43009 Migraine without aura, not intractable, without status migrainosus: Secondary | ICD-10-CM

## 2020-06-20 MED ORDER — EMGALITY 120 MG/ML ~~LOC~~ SOSY: 120.0000 mg | PREFILLED_SYRINGE | SUBCUTANEOUS | 0 refills | Status: DC

## 2020-06-30 ENCOUNTER — Ambulatory Visit: Payer: 59 | Admitting: Nurse Practitioner

## 2020-06-30 VITALS — BP 146/93 | HR 94 | Temp 97.6°F | Resp 16 | Ht 65.0 in | Wt 170.0 lb

## 2020-06-30 DIAGNOSIS — I10 Essential (primary) hypertension: Secondary | ICD-10-CM

## 2020-06-30 DIAGNOSIS — E782 Mixed hyperlipidemia: Secondary | ICD-10-CM

## 2020-06-30 DIAGNOSIS — F411 Generalized anxiety disorder: Secondary | ICD-10-CM

## 2020-06-30 DIAGNOSIS — E039 Hypothyroidism, unspecified: Secondary | ICD-10-CM

## 2020-06-30 DIAGNOSIS — G43009 Migraine without aura, not intractable, without status migrainosus: Secondary | ICD-10-CM | POA: Diagnosis not present

## 2020-06-30 DIAGNOSIS — L719 Rosacea, unspecified: Secondary | ICD-10-CM

## 2020-06-30 DIAGNOSIS — F410 Panic disorder [episodic paroxysmal anxiety] without agoraphobia: Secondary | ICD-10-CM

## 2020-06-30 MED ORDER — ALPRAZOLAM 0.5 MG PO TABS: 0.5000 mg | ORAL_TABLET | Freq: Two times a day (BID) | ORAL | 2 refills | Status: DC | PRN

## 2020-06-30 MED ORDER — DULOXETINE HCL 60 MG PO CPEP: ORAL_CAPSULE | ORAL | 1 refills | Status: DC

## 2020-06-30 MED ORDER — OMEPRAZOLE 40 MG PO CPDR: DELAYED_RELEASE_CAPSULE | ORAL | 1 refills | Status: DC

## 2020-06-30 MED ORDER — LEVOTHYROXINE SODIUM 50 MCG PO TABS: 50.0000 ug | ORAL_TABLET | Freq: Every day | ORAL | 1 refills | Status: DC

## 2020-06-30 MED ORDER — DESONIDE 0.05 % EX CREA: 1.0000 "application " | TOPICAL_CREAM | Freq: Three times a day (TID) | CUTANEOUS | 2 refills | Status: DC | PRN

## 2020-06-30 MED ORDER — EMGALITY 120 MG/ML ~~LOC~~ SOSY: 120.0000 mg | PREFILLED_SYRINGE | SUBCUTANEOUS | 5 refills | Status: DC

## 2020-06-30 MED ORDER — ZOLMITRIPTAN 5 MG PO TBDP
ORAL_TABLET | ORAL | 3 refills | Status: DC
Start: 2020-06-30 — End: 2020-07-04

## 2020-06-30 NOTE — Progress Notes (Signed)
[facility] [place], [place] [address]  Internal MEDICINE  Office Visit Note  Patient Name: Kathy Mccormick  [id number]  [id number]  Date of Service: 07/21/2020  Chief Complaint  Patient presents with  . Follow-up  . Gastroesophageal Reflux  . Hyperlipidemia  . Hypertension  . Medication Refill    desonide    The patient is here for routine follow up.  -blood pressure is well managed.  -continues to have good control of migraine headaches.  -needs to have refills of antidepressant  -takes alprazolam on as needed basis.  -needs to have new prescription for the ointments she uses for psoriasis.       Current Medication: Outpatient Encounter Medications as of 06/30/2020  Medication Sig  . acyclovir (ZOVIRAX) 400 MG tablet Take 1 tablet (400 mg total) by mouth 2 (two) times daily.  Marland Kitchen atorvastatin (LIPITOR) 10 MG tablet Take 1 tablet (10 mg total) by mouth at bedtime.  . Azelaic Acid 15 % cream After skin is thoroughly washed and patted dry, gently but thoroughly massage a thin film of azelaic acid cream into the affected area twice daily, in the morning and evening.  Marland Kitchen CHERRY PO Take by mouth.  . doxycycline (VIBRA-TABS) 100 MG tablet Take 1 tablet (100 mg total) by mouth daily.  Marland Kitchen estradiol (ESTRACE) 0.1 MG/GM vaginal cream 1 gram in vagina two times a week  . fluticasone (FLONASE) 50 MCG/ACT nasal spray Place 1 spray into both nostrils daily.  Marland Kitchen losartan (COZAAR) 100 MG tablet Take 1 tablet (100 mg total) by mouth daily.  . meloxicam (MOBIC) 15 MG tablet Take 1 tablet (15 mg total) by mouth daily as needed.  . Multiple Vitamin (MULTIVITAMIN) tablet Take 1 tablet by mouth daily.  . [DISCONTINUED] ALPRAZolam (XANAX) 0.5 MG tablet Take 1 tablet (0.5 mg total) by mouth 2 (two) times daily as needed for anxiety.  . [DISCONTINUED] desonide (DESOWEN) 0.05 % cream Apply 1 application topically 3 (three) times daily as needed.  . [DISCONTINUED] DULoxetine  (CYMBALTA) 60 MG capsule Take 1 capsule po one to two times daliy  . [DISCONTINUED] Galcanezumab-gnlm (EMGALITY) 120 MG/ML SOSY Inject 120 mg into the skin every 30 (thirty) days.  . [DISCONTINUED] levothyroxine (SYNTHROID) 50 MCG tablet Take 1 tablet (50 mcg total) by mouth daily before breakfast.  . [DISCONTINUED] omeprazole (PRILOSEC) 40 MG capsule Take 1 tab po daily  . [DISCONTINUED] zolmitriptan (ZOMIG-ZMT) 5 MG disintegrating tablet Take 1 tablet (5 mg total) by mouth as needed for migraine. May repeat after 2-3 hours. Max dose is 2 tabs in 24 hrs.  . [DISCONTINUED] zolpidem (AMBIEN CR) 12.5 MG CR tablet Take 1 tablet (12.5 mg total) by mouth at bedtime as needed for sleep.  Marland Kitchen ALPRAZolam (XANAX) 0.5 MG tablet Take 1 tablet (0.5 mg total) by mouth 2 (two) times daily as needed for anxiety.  . cephALEXin (KEFLEX) 500 MG capsule Take 1 capsule (500 mg total) by mouth 3 (three) times daily. (Patient not taking: Reported on 06/30/2020)  . desonide (DESOWEN) 0.05 % cream Apply 1 application topically 3 (three) times daily as needed.  . DULoxetine (CYMBALTA) 60 MG capsule Take 1 capsule po one to two times daliy  . fluconazole (DIFLUCAN) 150 MG tablet Take 1 tablet po once. May repeat dose in 3 days as needed for persistent symptoms. (Patient not taking: Reported on 06/30/2020)  . Galcanezumab-gnlm (EMGALITY) 120 MG/ML SOSY Inject 120 mg into the skin every 30 (thirty) days.  Marland Kitchen  levothyroxine (SYNTHROID) 50 MCG tablet Take 1 tablet (50 mcg total) by mouth daily before breakfast.  . omeprazole (PRILOSEC) 40 MG capsule Take 1 tab po daily  . tacrolimus (PROTOPIC) 0.1 % ointment Apply to affected areas on eyelids (Patient not taking: Reported on 06/30/2020)  . Ubrogepant (UBRELVY) 100 MG TABS Take 100 mg by mouth as needed. (Patient not taking: Reported on 06/30/2020)  . [DISCONTINUED] zolmitriptan (ZOMIG-ZMT) 5 MG disintegrating tablet May repeat after 2. Max dose is 2 tabs in 24 hrs.   No  facility-administered encounter medications on file as of 06/30/2020.    Surgical History: Past Surgical History:  Procedure Laterality Date  . BACK SURGERY    . CARPAL TUNNEL RELEASE    . CARPAL TUNNEL RELEASE Bilateral 2005  . CHOLECYSTECTOMY    . COLPOSCOPY    . OOPHORECTOMY     RSO,LSO  . PELVIC LAPAROSCOPY  2001,2004   DL X 2 RSO and LSO  . TONSILLECTOMY    . VAGINAL HYSTERECTOMY  2000   endometriosis    Medical History: Past Medical History:  Diagnosis Date  . Allergy    environmental  . CIN I (cervical intraepithelial neoplasia I)   . Endometriosis   . GERD (gastroesophageal reflux disease)   . Hyperlipidemia   . Hypertension   . Migraine   . Migraines   . Neck pain   . STD (sexually transmitted disease)    HSV ll    Family History: Family History  Problem Relation Age of Onset  . Hypertension Father   . Diabetes Mother   . Hypertension Mother   . Uterine cancer Mother   . Liver disease Mother   . Hypertension Brother   . Heart Problems Brother   . Breast cancer Maternal Aunt        Age 29's  . Aneurysm Maternal Aunt     Social History   Socioeconomic History  . Marital status: Married    Spouse name: Not on file  . Number of children: Not on file  . Years of education: Not on file  . Highest education level: Not on file  Occupational History  . Not on file  Tobacco Use  . Smoking status: Former Research scientist (life sciences)  . Smokeless tobacco: Never Used  Vaping Use  . Vaping Use: Never used  Substance and Sexual Activity  . Alcohol use: No    Alcohol/week: 0.0 standard drinks  . Drug use: No  . Sexual activity: Not Currently    Birth control/protection: Surgical    Comment: 1st intercourse 64 yo-More than 5 partners  Other Topics Concern  . Not on file  Social History Narrative  . Not on file   Social Determinants of Health   Financial Resource Strain: Not on file  Food Insecurity: Not on file  Transportation Needs: Not on file  Physical  Activity: Not on file  Stress: Not on file  Social Connections: Not on file  Intimate Partner Violence: Not on file      Review of Systems  Constitutional: Negative for activity change, fatigue and unexpected weight change.       Three pound weight loss since her last visit   HENT: Negative for postnasal drip, rhinorrhea, sinus pressure, sore throat and voice change.   Respiratory: Negative for cough, chest tightness and wheezing.   Cardiovascular: Negative for chest pain and palpitations.  Gastrointestinal: Negative for nausea and vomiting.  Endocrine: Negative for cold intolerance, heat intolerance, polydipsia and polyuria.  Stable thyroid  Musculoskeletal: Positive for arthralgias. Negative for myalgias.  Skin: Negative for rash.       Patches of psoriasis.   Allergic/Immunologic: Positive for environmental allergies.  Neurological: Positive for headaches. Negative for dizziness.       Migraine headaches continue to be well managed.   Hematological: Negative for adenopathy.  Psychiatric/Behavioral: Negative for dysphoric mood. The patient is nervous/anxious.        Depression is stable.     Today's Vitals   06/30/20 1541  BP: (!) 146/93  Pulse: 94  Resp: 16  Temp: 97.6 F (36.4 C)  SpO2: 99%  Weight: 170 lb (77.1 kg)  Height: 5\' 5"  (1.651 m)   Body mass index is 28.29 kg/m.  Physical Exam Vitals and nursing note reviewed.  Constitutional:      General: She is not in acute distress.    Appearance: Normal appearance. She is well-developed. She is not diaphoretic.  HENT:     Head: Normocephalic and atraumatic.     Nose: Nose normal.     Mouth/Throat:     Pharynx: No oropharyngeal exudate.  Eyes:     Extraocular Movements: Extraocular movements intact.     Conjunctiva/sclera: Conjunctivae normal.     Pupils: Pupils are equal, round, and reactive to light.  Neck:     Thyroid: No thyromegaly.     Vascular: No carotid bruit or JVD.     Trachea: No tracheal  deviation.  Cardiovascular:     Rate and Rhythm: Normal rate and regular rhythm.     Heart sounds: Normal heart sounds. No murmur heard. No friction rub. No gallop.   Pulmonary:     Effort: Pulmonary effort is normal. No respiratory distress.     Breath sounds: Normal breath sounds. No wheezing or rales.  Chest:     Chest wall: No tenderness.  Abdominal:     Palpations: Abdomen is soft.  Musculoskeletal:        General: Normal range of motion.     Cervical back: Normal range of motion and neck supple.  Lymphadenopathy:     Cervical: No cervical adenopathy.  Skin:    General: Skin is warm and dry.     Capillary Refill: Capillary refill takes less than 2 seconds.  Neurological:     General: No focal deficit present.     Mental Status: She is alert and oriented to person, place, and time.     Cranial Nerves: No cranial nerve deficit.     Coordination: Coordination normal.  Psychiatric:        Mood and Affect: Mood normal.        Behavior: Behavior normal.        Thought Content: Thought content normal.        Judgment: Judgment normal.    Assessment/Plan: 1. Essential (primary) hypertension Stable. Continue bp medication as prescribed   2. Mixed hyperlipidemia Continue atorvastatin as prescribed   3. Acquired hypothyroidism Stable  Thyroid. Continue levothyroxine as prescribed  - levothyroxine (SYNTHROID) 50 MCG tablet; Take 1 tablet (50 mcg total) by mouth daily before breakfast.  Dispense: 90 tablet; Refill: 1  4. Migraine without aura and without status migrainosus, not intractable Continue emgality 120mg  monthly. Use abortive therapy as needed and as prescribed  - Galcanezumab-gnlm (EMGALITY) 120 MG/ML SOSY; Inject 120 mg into the skin every 30 (thirty) days.  Dispense: 1 mL; Refill: 5  5. Rosacea May use desonide cream up to three times daliy as needed  -  desonide (DESOWEN) 0.05 % cream; Apply 1 application topically 3 (three) times daily as needed.  Dispense: 60 g;  Refill: 2  6. Generalized anxiety disorder with panic attacks Stable. Continue duloxetine 60mg  twice daliy. May take alprazolam 0.5mg  up to twice daily as needed for acute anxiety. New prescriptions sent to her pharmacy.  - ALPRAZolam (XANAX) 0.5 MG tablet; Take 1 tablet (0.5 mg total) by mouth 2 (two) times daily as needed for anxiety.  Dispense: 60 tablet; Refill: 2 - DULoxetine (CYMBALTA) 60 MG capsule; Take 1 capsule po one to two times daliy  Dispense: 180 capsule; Refill: 1  General Counseling: Kathy Mccormick verbalizes understanding of the findings of todays visit and agrees with plan of treatment. I have discussed any further diagnostic evaluation that may be needed or ordered today. We also reviewed her medications today. she has been encouraged to call the office with any questions or concerns that should arise related to todays visit.  This patient was seen by FNP Collaboration with Dr Vincent Gros as a part of collaborative care agreement  Meds ordered this encounter  Medications  . desonide (DESOWEN) 0.05 % cream    Sig: Apply 1 application topically 3 (three) times daily as needed.    Dispense:  60 g    Refill:  2    Order Specific Question:   Supervising Provider    Answer:   Lyndon Code [[id number]]  . DISCONTD: zolmitriptan (ZOMIG-ZMT) 5 MG disintegrating tablet    Sig: May repeat after 2. Max dose is 2 tabs in 24 hrs.    Dispense:  10 tablet    Refill:  3    Order Specific Question:   Supervising Provider    Answer:   Lyndon Code [[id number]]  . Galcanezumab-gnlm (EMGALITY) 120 MG/ML SOSY    Sig: Inject 120 mg into the skin every 30 (thirty) days.    Dispense:  1 mL    Refill:  5    Order Specific Question:   Supervising Provider    Answer:   Lyndon Code [[id number]]  . ALPRAZolam (XANAX) 0.5 MG tablet    Sig: Take 1 tablet (0.5 mg total) by mouth 2 (two) times daily as needed for anxiety.    Dispense:  60 tablet    Refill:  2    Order Specific Question:   Supervising  Provider    Answer:   Lyndon Code [[id number]]  . DULoxetine (CYMBALTA) 60 MG capsule    Sig: Take 1 capsule po one to two times daliy    Dispense:  180 capsule    Refill:  1    Please note increase in dosing.    Order Specific Question:   Supervising Provider    Answer:   Lyndon Code [[id number]]  . levothyroxine (SYNTHROID) 50 MCG tablet    Sig: Take 1 tablet (50 mcg total) by mouth daily before breakfast.    Dispense:  90 tablet    Refill:  1    Order Specific Question:   Supervising Provider    Answer:   Lyndon Code [[id number]]  . omeprazole (PRILOSEC) 40 MG capsule    Sig: Take 1 tab po daily    Dispense:  90 capsule    Refill:  1    Order Specific Question:   Supervising Provider    Answer:   Lyndon Code [[id number]]    Total time spent: 30 Minutes   Time spent includes review of chart,  medications, test results, and follow up plan with the patient.      Dr Lavera Guise Internal medicine

## 2020-07-04 ENCOUNTER — Other Ambulatory Visit: Payer: Self-pay

## 2020-07-04 DIAGNOSIS — G43009 Migraine without aura, not intractable, without status migrainosus: Secondary | ICD-10-CM

## 2020-07-04 MED ORDER — ZOLMITRIPTAN 5 MG PO TBDP: ORAL_TABLET | ORAL | 3 refills | Status: DC

## 2020-07-07 ENCOUNTER — Other Ambulatory Visit: Payer: Self-pay | Admitting: Nurse Practitioner

## 2020-07-07 DIAGNOSIS — F5101 Primary insomnia: Secondary | ICD-10-CM

## 2020-07-07 MED ORDER — ZOLPIDEM TARTRATE ER 12.5 MG PO TBCR: 12.5000 mg | EXTENDED_RELEASE_TABLET | Freq: Every evening | ORAL | 1 refills | Status: DC | PRN

## 2020-07-21 ENCOUNTER — Encounter: Payer: Self-pay | Admitting: Nurse Practitioner

## 2020-08-31 ENCOUNTER — Encounter: Payer: Self-pay | Admitting: Obstetrics and Gynecology

## 2020-08-31 ENCOUNTER — Ambulatory Visit: Payer: 59

## 2020-08-31 ENCOUNTER — Ambulatory Visit (INDEPENDENT_AMBULATORY_CARE_PROVIDER_SITE_OTHER): Payer: 59 | Admitting: Obstetrics and Gynecology

## 2020-08-31 ENCOUNTER — Other Ambulatory Visit: Payer: Self-pay

## 2020-08-31 VITALS — BP 120/72 | HR 76 | Ht 64.75 in | Wt 167.0 lb

## 2020-08-31 DIAGNOSIS — Z01419 Encounter for gynecological examination (general) (routine) without abnormal findings: Secondary | ICD-10-CM | POA: Diagnosis not present

## 2020-08-31 DIAGNOSIS — E039 Hypothyroidism, unspecified: Secondary | ICD-10-CM

## 2020-08-31 DIAGNOSIS — Z8639 Personal history of other endocrine, nutritional and metabolic disease: Secondary | ICD-10-CM

## 2020-08-31 DIAGNOSIS — Z13220 Encounter for screening for lipoid disorders: Secondary | ICD-10-CM | POA: Diagnosis not present

## 2020-08-31 LAB — COMPREHENSIVE METABOLIC PANEL
AG Ratio: 2.7 (calc) — ABNORMAL HIGH (ref 1.0–2.5)
ALT: 19 U/L (ref 6–29)
AST: 16 U/L (ref 10–35)
Albumin: 4.3 g/dL (ref 3.6–5.1)
Alkaline phosphatase (APISO): 82 U/L (ref 37–153)
BUN: 18 mg/dL (ref 7–25)
CO2: 29 mmol/L (ref 20–32)
Calcium: 9.1 mg/dL (ref 8.6–10.4)
Chloride: 100 mmol/L (ref 98–110)
Creat: 0.73 mg/dL (ref 0.50–0.99)
Globulin: 1.6 g/dL (calc) — ABNORMAL LOW (ref 1.9–3.7)
Glucose, Bld: 86 mg/dL (ref 65–99)
Potassium: 4.2 mmol/L (ref 3.5–5.3)
Sodium: 138 mmol/L (ref 135–146)
Total Bilirubin: 0.5 mg/dL (ref 0.2–1.2)
Total Protein: 5.9 g/dL — ABNORMAL LOW (ref 6.1–8.1)

## 2020-08-31 LAB — CBC
HCT: 43.4 % (ref 35.0–45.0)
Hemoglobin: 14.8 g/dL (ref 11.7–15.5)
MCH: 31.4 pg (ref 27.0–33.0)
MCHC: 34.1 g/dL (ref 32.0–36.0)
MCV: 91.9 fL (ref 80.0–100.0)
MPV: 9.8 fL (ref 7.5–12.5)
Platelets: 347 10*3/uL (ref 140–400)
RBC: 4.72 10*6/uL (ref 3.80–5.10)
RDW: 12.3 % (ref 11.0–15.0)
WBC: 6.4 10*3/uL (ref 3.8–10.8)

## 2020-08-31 LAB — TSH: TSH: 1.88 mIU/L (ref 0.40–4.50)

## 2020-08-31 LAB — LIPID PANEL
Cholesterol: 214 mg/dL — ABNORMAL HIGH (ref <200)
HDL: 77 mg/dL (ref >=50)
LDL Cholesterol (Calc): 118 mg/dL (calc) — ABNORMAL HIGH
Non-HDL Cholesterol (Calc): 137 mg/dL (calc) — ABNORMAL HIGH (ref <130)
Total CHOL/HDL Ratio: 2.8 (calc) (ref <5.0)
Triglycerides: 91 mg/dL (ref <150)

## 2020-08-31 LAB — VITAMIN D 25 HYDROXY (VIT D DEFICIENCY, FRACTURES): Vit D, 25-Hydroxy: 38 ng/mL (ref 30–100)

## 2020-08-31 MED ORDER — ESTRADIOL 0.1 MG/GM VA CREA: TOPICAL_CREAM | VAGINAL | 9 refills | Status: DC

## 2020-08-31 NOTE — Progress Notes (Signed)
KOA PALLA 02-Dec-1955 [id number]  SUBJECTIVE:  65 y.o. G1P0010 female for annual routine gynecologic exam. She has no gynecologic concerns.  Current Outpatient Medications  Medication Sig Dispense Refill  . acyclovir (ZOVIRAX) 400 MG tablet Take 1 tablet (400 mg total) by mouth 2 (two) times daily. 180 tablet 1  . ALPRAZolam (XANAX) 0.5 MG tablet Take 1 tablet (0.5 mg total) by mouth 2 (two) times daily as needed for anxiety. 60 tablet 2  . atorvastatin (LIPITOR) 10 MG tablet Take 1 tablet (10 mg total) by mouth at bedtime. 90 tablet 1  . Azelaic Acid 15 % cream After skin is thoroughly washed and patted dry, gently but thoroughly massage a thin film of azelaic acid cream into the affected area twice daily, in the morning and evening. 50 g 0  . CHERRY PO Take by mouth.    . desonide (DESOWEN) 0.05 % cream Apply 1 application topically 3 (three) times daily as needed. 60 g 2  . doxycycline (VIBRA-TABS) 100 MG tablet Take 1 tablet (100 mg total) by mouth daily. 90 tablet 1  . DULoxetine (CYMBALTA) 60 MG capsule Take 1 capsule po one to two times daliy 180 capsule 1  . estradiol (ESTRACE) 0.1 MG/GM vaginal cream 1 gram in vagina two times a week 42.5 g 9  . fluticasone (FLONASE) 50 MCG/ACT nasal spray Place 1 spray into both nostrils daily. 48 g 1  . Galcanezumab-gnlm (EMGALITY) 120 MG/ML SOSY Inject 120 mg into the skin every 30 (thirty) days. 1 mL 5  . levothyroxine (SYNTHROID) 50 MCG tablet Take 1 tablet (50 mcg total) by mouth daily before breakfast. 90 tablet 1  . losartan (COZAAR) 100 MG tablet Take 1 tablet (100 mg total) by mouth daily. 90 tablet 1  . meloxicam (MOBIC) 15 MG tablet Take 1 tablet (15 mg total) by mouth daily as needed. 90 tablet 1  . Multiple Vitamin (MULTIVITAMIN) tablet Take 1 tablet by mouth daily.    Marland Kitchen omeprazole (PRILOSEC) 40 MG capsule Take 1 tab po daily 90 capsule 1  . zolmitriptan (ZOMIG-ZMT) 5 MG disintegrating tablet Take 1 tablet by mouth PRN for  migraines.  May repeat after 2-3 hrs.  Max dose is 2 tabs in 24 hrs 10 tablet 3  . zolpidem (AMBIEN CR) 12.5 MG CR tablet Take 1 tablet (12.5 mg total) by mouth at bedtime as needed for sleep. 90 tablet 1  . RESTASIS 0.05 % ophthalmic emulsion 1 drop 2 (two) times daily. (Patient not taking: Reported on 08/31/2020)     No current facility-administered medications for this visit.   Allergies: Codeine and Hydrocodone-acetaminophen  No LMP recorded. Patient has had a hysterectomy.  Past medical history,surgical history, problem list, medications, allergies, family history and social history were all reviewed and documented as reviewed in the EPIC chart.  ROS: Pertinent positives and negatives as reviewed in HPI   OBJECTIVE:  BP 120/72 (BP Location: Right Arm, Patient Position: Sitting, Cuff Size: Normal)   Pulse 76   Ht 5' 4.75" (1.645 m)   Wt 167 lb (75.8 kg)   BMI 28.01 kg/m  The patient appears well, alert, oriented, in no distress. ENT normal.  Neck supple. No cervical or supraclavicular adenopathy or thyromegaly.  Lungs are clear, good air entry, no wheezes, rhonchi or rales. S1 and S2 normal, no murmurs, regular rate and rhythm.  Abdomen soft without tenderness, guarding, mass or organomegaly.  Neurological is normal, no focal findings.  BREAST EXAM: breasts appear normal,  no suspicious masses, no skin or nipple changes or axillary nodes  PELVIC EXAM: VULVA: normal appearing vulva with atrophic change, no masses, tenderness or lesions, VAGINA: normal appearing vagina with atrophic change, normal color and discharge, no lesions, CERVIX: surgically absent, UTERUS: surgically absent, vaginal cuff normal, ADNEXA: no masses  Chaperone: Terence Lux present during the examination  ASSESSMENT:  65 y.o. G1P0010 here for annual gynecologic exam  PLAN:  1. Postmenopause.   Prior TVH and subsequent RSO and LSO for endometriosis.  Vaginal estradiol cream 1 gm twice weekly refilled x1  year.  Risks of systemic absorption are reviewed. 2. Pap smear 2019.  Vaginal cuff Pap smear repeated today.  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 3. Mammogram scheduled next month.  Normal breast exam today.   4.  History of genital HSV.  She does use acyclovir for daily suppression.  She does get refills from her primary care provider. 5. Colonoscopy 2014.  Recommended that she follow up at the recommended interval.  6. DEXA 2013 was normal.  Repeat DEXA recommended now so she plans to schedule this. 7. Health maintenance.  She will proceed to lab today for routine screening blood work (lipids, CBC, CMP, TSH for history of hypothyroidism on replacement, and vitamin D level given risk factors for low bone density). The patient is aware that I will only be at this practice through the end of the week so she knows to make sure she requests follow-up on any results that may not have been able to be reviewed before then.  Return annually or sooner, prn.  Joseph Pierini MD  08/31/20

## 2020-09-02 LAB — PAP IG W/ RFLX HPV ASCU

## 2020-09-19 ENCOUNTER — Encounter: Payer: Self-pay | Admitting: Hospice and Palliative Medicine

## 2020-09-19 ENCOUNTER — Ambulatory Visit: Payer: 59 | Admitting: Hospice and Palliative Medicine

## 2020-09-19 VITALS — BP 134/90 | HR 94 | Temp 97.3°F | Resp 16 | Ht 65.0 in | Wt 169.2 lb

## 2020-09-19 DIAGNOSIS — E782 Mixed hyperlipidemia: Secondary | ICD-10-CM | POA: Diagnosis not present

## 2020-09-19 DIAGNOSIS — E039 Hypothyroidism, unspecified: Secondary | ICD-10-CM

## 2020-09-19 DIAGNOSIS — I10 Essential (primary) hypertension: Secondary | ICD-10-CM | POA: Diagnosis not present

## 2020-09-19 DIAGNOSIS — G43009 Migraine without aura, not intractable, without status migrainosus: Secondary | ICD-10-CM

## 2020-09-19 NOTE — Progress Notes (Signed)
[facility] [place], [place] [address]  Internal MEDICINE  Office Visit Note  Patient Name: Kathy Mccormick  [id number]  [id number]  Date of Service: 09/20/2020  Chief Complaint  Patient presents with  . Acute Visit    GYN wants blood work done      HPI Pt is here for a sick visit. Had recent women well visit with GYN and had labs done GYN notified her that there were a few abnormalities and needed to follow-up with PCP Reviewed labs--abnormal lipid panel but remains stable, on statin therapy, total protein 5.9--has been stable in the past, globulin 1.6 again has been stable in the past--normal LFTs Denies alcohol intake Not currently experiencing any acute symptoms or having acute issues today  Became anxious after receiving call from GYN--requesting to have all labs redrawn   Current Medication:  Outpatient Encounter Medications as of 09/19/2020  Medication Sig  . acyclovir (ZOVIRAX) 400 MG tablet Take 1 tablet (400 mg total) by mouth 2 (two) times daily.  Marland Kitchen ALPRAZolam (XANAX) 0.5 MG tablet Take 1 tablet (0.5 mg total) by mouth 2 (two) times daily as needed for anxiety.  Marland Kitchen atorvastatin (LIPITOR) 10 MG tablet Take 1 tablet (10 mg total) by mouth at bedtime.  . Azelaic Acid 15 % cream After skin is thoroughly washed and patted dry, gently but thoroughly massage a thin film of azelaic acid cream into the affected area twice daily, in the morning and evening.  Marland Kitchen CHERRY PO Take by mouth.  . desonide (DESOWEN) 0.05 % cream Apply 1 application topically 3 (three) times daily as needed.  . doxycycline (VIBRA-TABS) 100 MG tablet Take 1 tablet (100 mg total) by mouth daily.  . DULoxetine (CYMBALTA) 60 MG capsule Take 1 capsule po one to two times daliy  . estradiol (ESTRACE) 0.1 MG/GM vaginal cream 1 gram in vagina two times a week  . fluticasone (FLONASE) 50 MCG/ACT nasal spray Place 1 spray into both nostrils daily.  . Galcanezumab-gnlm (EMGALITY) 120 MG/ML  SOSY Inject 120 mg into the skin every 30 (thirty) days.  Marland Kitchen levothyroxine (SYNTHROID) 50 MCG tablet Take 1 tablet (50 mcg total) by mouth daily before breakfast.  . losartan (COZAAR) 100 MG tablet Take 1 tablet (100 mg total) by mouth daily.  . meloxicam (MOBIC) 15 MG tablet Take 1 tablet (15 mg total) by mouth daily as needed.  . Multiple Vitamin (MULTIVITAMIN) tablet Take 1 tablet by mouth daily.  Marland Kitchen omeprazole (PRILOSEC) 40 MG capsule Take 1 tab po daily  . RESTASIS 0.05 % ophthalmic emulsion 1 drop 2 (two) times daily. (Patient not taking: Reported on 08/31/2020)  . zolmitriptan (ZOMIG-ZMT) 5 MG disintegrating tablet Take 1 tablet by mouth PRN for migraines.  May repeat after 2-3 hrs.  Max dose is 2 tabs in 24 hrs  . zolpidem (AMBIEN CR) 12.5 MG CR tablet Take 1 tablet (12.5 mg total) by mouth at bedtime as needed for sleep.   No facility-administered encounter medications on file as of 09/19/2020.      Medical History: Past Medical History:  Diagnosis Date  . Allergy    environmental  . CIN I (cervical intraepithelial neoplasia I)   . Endometriosis   . GERD (gastroesophageal reflux disease)   . Hyperlipidemia   . Hypertension   . Migraine   . Migraines   . Neck pain   . STD (sexually transmitted disease)    HSV ll     Vital Signs: BP 134/90  Pulse 94   Temp (!) 97.3 F (36.3 C)   Resp 16   Ht 5\' 5"  (1.651 m)   Wt 169 lb 3.2 oz (76.7 kg)   SpO2 99%   BMI 28.16 kg/m    Review of Systems  Constitutional: Negative for chills, diaphoresis and fatigue.  HENT: Negative for ear pain, postnasal drip and sinus pressure.   Eyes: Negative for photophobia, discharge, redness, itching and visual disturbance.  Respiratory: Negative for cough, shortness of breath and wheezing.   Cardiovascular: Negative for chest pain, palpitations and leg swelling.  Gastrointestinal: Negative for abdominal pain, constipation, diarrhea, nausea and vomiting.  Genitourinary: Negative for dysuria  and flank pain.  Musculoskeletal: Negative for arthralgias, back pain, gait problem and neck pain.  Skin: Negative for color change.  Allergic/Immunologic: Negative for environmental allergies and food allergies.  Neurological: Negative for dizziness and headaches.  Hematological: Does not bruise/bleed easily.  Psychiatric/Behavioral: Negative for agitation, behavioral problems (depression) and hallucinations.    Physical Exam Vitals reviewed.  Constitutional:      Appearance: Normal appearance. She is normal weight.  Cardiovascular:     Rate and Rhythm: Normal rate and regular rhythm.     Pulses: Normal pulses.     Heart sounds: Normal heart sounds.  Pulmonary:     Effort: Pulmonary effort is normal.     Breath sounds: Normal breath sounds.  Abdominal:     General: Abdomen is flat.     Palpations: Abdomen is soft.  Musculoskeletal:        General: Normal range of motion.     Cervical back: Normal range of motion.  Skin:    General: Skin is warm.  Neurological:     General: No focal deficit present.     Mental Status: She is alert and oriented to person, place, and time. Mental status is at baseline.  Psychiatric:        Mood and Affect: Mood normal.        Behavior: Behavior normal.        Thought Content: Thought content normal.        Judgment: Judgment normal.    Assessment/Plan: 1. Essential (primary) hypertension BP and HR well controlled, continue to monitor - Comprehensive Metabolic Panel (CMET)  2. Acquired hypothyroidism Will review updated levels and adjust therapy as indicated - TSH + free T4  3. Mixed hyperlipidemia Lipid levels remain stable, tolerating statin well   General Counseling: Marlina verbalizes understanding of the findings of todays visit and agrees with plan of treatment. I have discussed any further diagnostic evaluation that may be needed or ordered today. We also reviewed her medications today. she has been encouraged to call the office  with any questions or concerns that should arise related to todays visit.   Orders Placed This Encounter  Procedures  . Comprehensive Metabolic Panel (CMET)  . TSH + free T4   Time spent: 30 Minutes Time spent includes review of chart, medications, test results and follow-up plan with the patient.  This patient was seen by Theodoro Grist AGNP-C in Collaboration with Dr Lavera Guise as a part of collaborative care agreement.  Tanna Furry [facility] Internal Medicine

## 2020-09-20 ENCOUNTER — Encounter: Payer: Self-pay | Admitting: Hospice and Palliative Medicine

## 2020-09-28 LAB — COMPREHENSIVE METABOLIC PANEL
ALT: 17 IU/L (ref 0–32)
AST: 24 IU/L (ref 0–40)
Albumin/Globulin Ratio: 2.3 — ABNORMAL HIGH (ref 1.2–2.2)
Albumin: 4.2 g/dL (ref 3.8–4.8)
Alkaline Phosphatase: 93 IU/L (ref 44–121)
BUN/Creatinine Ratio: 16 (ref 12–28)
BUN: 13 mg/dL (ref 8–27)
Bilirubin Total: 0.4 mg/dL (ref 0.0–1.2)
CO2: 22 mmol/L (ref 20–29)
Calcium: 9.1 mg/dL (ref 8.7–10.3)
Chloride: 103 mmol/L (ref 96–106)
Creatinine, Ser: 0.79 mg/dL (ref 0.57–1.00)
Globulin, Total: 1.8 g/dL (ref 1.5–4.5)
Glucose: 95 mg/dL (ref 65–99)
Potassium: 4.3 mmol/L (ref 3.5–5.2)
Sodium: 142 mmol/L (ref 134–144)
Total Protein: 6 g/dL (ref 6.0–8.5)
eGFR: 85 mL/min/{1.73_m2} (ref >59)

## 2020-09-28 LAB — TSH+FREE T4
Free T4: 1.15 ng/dL (ref 0.82–1.77)
TSH: 1.58 u[IU]/mL (ref 0.450–4.500)

## 2020-09-28 NOTE — Progress Notes (Signed)
Labs reviewed, will discuss at upcoming visit.

## 2020-10-18 ENCOUNTER — Other Ambulatory Visit: Payer: Self-pay

## 2020-10-18 ENCOUNTER — Ambulatory Visit
Admission: RE | Admit: 2020-10-18 | Discharge: 2020-10-18 | Disposition: A | Discharge status: Home / Self Care | Payer: 59 | Source: Ambulatory Visit | Attending: Obstetrics and Gynecology | Admitting: Obstetrics and Gynecology

## 2020-10-18 DIAGNOSIS — Z1231 Encounter for screening mammogram for malignant neoplasm of breast: Secondary | ICD-10-CM

## 2020-10-24 ENCOUNTER — Encounter: Payer: Self-pay | Admitting: Physician Assistant

## 2020-10-24 ENCOUNTER — Ambulatory Visit: Payer: 59 | Admitting: Physician Assistant

## 2020-10-24 DIAGNOSIS — R059 Cough, unspecified: Secondary | ICD-10-CM | POA: Diagnosis not present

## 2020-10-24 DIAGNOSIS — I10 Essential (primary) hypertension: Secondary | ICD-10-CM

## 2020-10-24 DIAGNOSIS — J069 Acute upper respiratory infection, unspecified: Secondary | ICD-10-CM | POA: Diagnosis not present

## 2020-10-24 MED ORDER — AZITHROMYCIN 250 MG PO TABS: ORAL_TABLET | ORAL | 0 refills | Status: DC

## 2020-10-24 MED ORDER — HYDROCOD POLST-CPM POLST ER 10-8 MG/5ML PO SUER: 5.0000 mL | Freq: Two times a day (BID) | ORAL | 0 refills | Status: DC | PRN

## 2020-10-24 NOTE — Progress Notes (Unsigned)
[facility] [place], [place] [address]  Internal MEDICINE  Office Visit Note  Patient Name: Kathy Mccormick  [id number]  [id number]  Date of Service: 11/01/2020  Chief Complaint  Patient presents with  . Acute Visit     possible bronchitis, ears and head is stuffed up, drainage, right side of throat is sore, started Thursday      HPI Pt is here for a sick visit. -Started with a cough for the past 4 days. Feels postnasal drip. Took 1/2 tab of pseudophed which triggered it to drain and actually feel worse. Also has sore throat, right side feels like something is in there. Ears now feel like they have pressure. This is the first time calling out of work in 10 years today. Took 3 home covid tests (different days) all negative. Would like flu and strep swab. Did have tonsills out. Symptoms are worsening. Denies fevers, chills, body aches, SOB, or wheezing. Cough is productive.  -Quit smoking in 1988.  -Had a telecall yesterday morning and was given albuterol inhaler and tessalon pearls. Albuterol did not do anything and tessalon peals helped ease it for an hour and then its back. Cough is worse at night and she has not been sleeping well.  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  Current Medication:  Outpatient Encounter Medications as of 10/24/2020  Medication Sig  . azithromycin (ZITHROMAX) 250 MG tablet Take one tab a day for 10 days for uri  . chlorpheniramine-HYDROcodone (TUSSIONEX PENNKINETIC ER) 10-8 MG/5ML SUER Take 5 mLs by mouth every 12 (twelve) hours as needed for cough.  Marland Kitchen acyclovir (ZOVIRAX) 400 MG tablet Take 1 tablet (400 mg total) by mouth 2 (two) times daily.  Marland Kitchen ALPRAZolam (XANAX) 0.5 MG tablet Take 1 tablet (0.5 mg total) by mouth 2 (two) times daily as needed for anxiety.  Marland Kitchen atorvastatin (LIPITOR) 10 MG tablet Take 1 tablet (10 mg total) by mouth at bedtime.  . Azelaic Acid 15 % cream After skin is thoroughly washed and  patted dry, gently but thoroughly massage a thin film of azelaic acid cream into the affected area twice daily, in the morning and evening.  Marland Kitchen CHERRY PO Take by mouth.  . desonide (DESOWEN) 0.05 % cream Apply 1 application topically 3 (three) times daily as needed.  . doxycycline (VIBRA-TABS) 100 MG tablet Take 1 tablet (100 mg total) by mouth daily.  . DULoxetine (CYMBALTA) 60 MG capsule Take 1 capsule po one to two times daliy  . estradiol (ESTRACE) 0.1 MG/GM vaginal cream 1 gram in vagina two times a week  . fluticasone (FLONASE) 50 MCG/ACT nasal spray Place 1 spray into both nostrils daily.  . Galcanezumab-gnlm (EMGALITY) 120 MG/ML SOSY Inject 120 mg into the skin every 30 (thirty) days.  Marland Kitchen levothyroxine (SYNTHROID) 50 MCG tablet Take 1 tablet (50 mcg total) by mouth daily before breakfast.  . losartan (COZAAR) 100 MG tablet Take 1 tablet (100 mg total) by mouth daily.  . meloxicam (MOBIC) 15 MG tablet Take 1 tablet (15 mg total) by mouth daily as needed.  . Multiple Vitamin (MULTIVITAMIN) tablet Take 1 tablet by mouth daily.  Marland Kitchen omeprazole (PRILOSEC) 40 MG capsule Take 1 tab po daily  . RESTASIS 0.05 % ophthalmic emulsion 1 drop 2 (two) times daily. (Patient not taking: Reported on 08/31/2020)  . zolmitriptan (ZOMIG-ZMT) 5 MG disintegrating tablet Take 1 tablet by mouth PRN for migraines.  May repeat after 2-3 hrs.  Max dose is 2 tabs in 24 hrs  . zolpidem (AMBIEN CR) 12.5 MG CR tablet Take 1 tablet (12.5 mg total) by mouth at bedtime as needed for sleep.   No facility-administered encounter medications on file as of 10/24/2020.      Medical History: Past Medical History:  Diagnosis Date  . Allergy    environmental  . CIN I (cervical intraepithelial neoplasia I)   . Endometriosis   . GERD (gastroesophageal reflux disease)   . Hyperlipidemia   . Hypertension   . Migraine   . Migraines   . Neck pain   . STD (sexually transmitted disease)    HSV ll     Vital Signs: BP (!)  138/92   Pulse 90   Temp 97.7 F (36.5 C)   Resp 16   Ht 5\' 5"  (1.651 m)   Wt 161 lb 9.6 oz (73.3 kg)   SpO2 99%   BMI 26.89 kg/m    Review of Systems  Constitutional: Negative for chills, fatigue and fever.  HENT: Positive for ear pain, postnasal drip, sinus pressure, sore throat and voice change. Negative for congestion and mouth sores.   Respiratory: Positive for cough. Negative for shortness of breath and wheezing.   Cardiovascular: Negative for chest pain.  Genitourinary: Negative for flank pain.  Psychiatric/Behavioral: Negative.     Physical Exam Vitals and nursing note reviewed.  Constitutional:      General: She is not in acute distress.    Appearance: She is well-developed. She is not diaphoretic.  HENT:     Head: Normocephalic and atraumatic.     Right Ear: Tympanic membrane normal.     Left Ear: Tympanic membrane normal.     Nose: Congestion present.     Mouth/Throat:     Pharynx: Posterior oropharyngeal erythema present. No oropharyngeal exudate.  Eyes:     Pupils: Pupils are equal, round, and reactive to light.  Neck:     Thyroid: No thyromegaly.     Vascular: No JVD.     Trachea: No tracheal deviation.  Cardiovascular:     Rate and Rhythm: Normal rate and regular rhythm.     Heart sounds: Normal heart sounds. No murmur heard. No friction rub. No gallop.   Pulmonary:     Effort: Pulmonary effort is normal. No respiratory distress.     Breath sounds: No wheezing or rales.  Chest:     Chest wall: No tenderness.  Abdominal:     General: Bowel sounds are normal.     Palpations: Abdomen is soft.  Musculoskeletal:        General: Normal range of motion.     Cervical back: Normal range of motion and neck supple.  Lymphadenopathy:     Cervical: No cervical adenopathy.  Skin:    General: Skin is warm and dry.  Neurological:     Mental Status: She is alert and oriented to person, place, and time.     Cranial Nerves: No cranial nerve deficit.   Psychiatric:        Behavior: Behavior normal.        Thought Content: Thought content normal.        Judgment: Judgment normal.       Assessment/Plan: 1. Upper respiratory tract infection, unspecified type Will start on azithromycin and use flonase and mucinex. Recommend soup or tea and honey to help soothe sore throat as well. - azithromycin (ZITHROMAX) 250 MG tablet; Take one tab a day for 10  days for uri  Dispense: 10 tablet; Refill: 0  2. Cough Start on azithromycin and may take tussionex to help with cough. Patient has albuterol inhaler to use if needed. Pt will call if worsening with any wheezing or SOB. - chlorpheniramine-HYDROcodone (TUSSIONEX PENNKINETIC ER) 10-8 MG/5ML SUER; Take 5 mLs by mouth every 12 (twelve) hours as needed for cough.  Dispense: 140 mL; Refill: 0 - POCT Influenza A/B negative - POCT rapid strep A negative  3. Essential (primary) hypertension Stable, continue losartan    General Counseling: Tamecia verbalizes understanding of the findings of todays visit and agrees with plan of treatment. I have discussed any further diagnostic evaluation that may be needed or ordered today. We also reviewed her medications today. she has been encouraged to call the office with any questions or concerns that should arise related to todays visit.    Counseling:    Orders Placed This Encounter  Procedures  . POCT Influenza A/B  . POCT rapid strep A    Meds ordered this encounter  Medications  . chlorpheniramine-HYDROcodone (TUSSIONEX PENNKINETIC ER) 10-8 MG/5ML SUER    Sig: Take 5 mLs by mouth every 12 (twelve) hours as needed for cough.    Dispense:  140 mL    Refill:  0  . azithromycin (ZITHROMAX) 250 MG tablet    Sig: Take one tab a day for 10 days for uri    Dispense:  10 tablet    Refill:  0    Time spent:30 Minutes

## 2020-10-26 ENCOUNTER — Other Ambulatory Visit: Payer: Self-pay | Admitting: Nurse Practitioner

## 2020-10-26 DIAGNOSIS — I10 Essential (primary) hypertension: Secondary | ICD-10-CM

## 2020-10-26 DIAGNOSIS — M150 Primary generalized (osteo)arthritis: Secondary | ICD-10-CM

## 2020-10-26 DIAGNOSIS — L719 Rosacea, unspecified: Secondary | ICD-10-CM

## 2020-10-26 DIAGNOSIS — E782 Mixed hyperlipidemia: Secondary | ICD-10-CM

## 2020-10-27 ENCOUNTER — Telehealth: Payer: Self-pay

## 2020-10-27 MED ORDER — PREDNISONE 10 MG PO TABS: ORAL_TABLET | ORAL | 0 refills | Status: DC

## 2020-10-27 NOTE — Telephone Encounter (Signed)
Pt called asking if she could get a stronger antibiotic and some prednisone bc she is still not feeling any better.  Per Ander Purpura we will send in a prednisone taper and that pt needs to continue the azithromycin

## 2020-10-27 NOTE — Telephone Encounter (Signed)
medications sent to pharmacy

## 2020-11-01 ENCOUNTER — Ambulatory Visit (INDEPENDENT_AMBULATORY_CARE_PROVIDER_SITE_OTHER): Payer: 59 | Admitting: Hospice and Palliative Medicine

## 2020-11-01 ENCOUNTER — Other Ambulatory Visit: Payer: Self-pay

## 2020-11-01 ENCOUNTER — Encounter: Payer: Self-pay | Admitting: Hospice and Palliative Medicine

## 2020-11-01 VITALS — BP 138/88 | HR 79 | Temp 96.8°F | Resp 16 | Ht 65.0 in | Wt 161.2 lb

## 2020-11-01 DIAGNOSIS — I10 Essential (primary) hypertension: Secondary | ICD-10-CM

## 2020-11-01 DIAGNOSIS — J0141 Acute recurrent pansinusitis: Secondary | ICD-10-CM | POA: Diagnosis not present

## 2020-11-01 DIAGNOSIS — E782 Mixed hyperlipidemia: Secondary | ICD-10-CM

## 2020-11-01 MED ORDER — LEVOFLOXACIN 500 MG PO TABS: 500.0000 mg | ORAL_TABLET | Freq: Every day | ORAL | 0 refills | Status: AC

## 2020-11-01 NOTE — Progress Notes (Signed)
[facility] [place], [place] [address]  Internal MEDICINE  Office Visit Note  Patient Name: Kathy Mccormick  [id number]  [id number]  Date of Service: 11/03/2020  Chief Complaint  Patient presents with  . Allergies  . Gastroesophageal Reflux  . Hypertension  . Follow-up    Sinus pressure, ears stopped up, still has cough, refill request, discuss meds    HPI Patient is here for routine follow-up Has completed 10 day course of azithromycin from last visit Continues to complain of chest congestion, sinus pain and pressure and ears feel clogged and stuffy Denies shortness of breath or difficulty breathing  BP initially elevated but improved on recheck   Current Medication: Outpatient Encounter Medications as of 11/01/2020  Medication Sig  . levofloxacin (LEVAQUIN) 500 MG tablet Take 1 tablet (500 mg total) by mouth daily for 7 days.  Marland Kitchen acyclovir (ZOVIRAX) 400 MG tablet Take 1 tablet (400 mg total) by mouth 2 (two) times daily.  Marland Kitchen ALPRAZolam (XANAX) 0.5 MG tablet Take 1 tablet (0.5 mg total) by mouth 2 (two) times daily as needed for anxiety.  . Azelaic Acid 15 % cream After skin is thoroughly washed and patted dry, gently but thoroughly massage a thin film of azelaic acid cream into the affected area twice daily, in the morning and evening.  Marland Kitchen CHERRY PO Take by mouth.  . desonide (DESOWEN) 0.05 % cream Apply 1 application topically 3 (three) times daily as needed.  . DULoxetine (CYMBALTA) 60 MG capsule Take 1 capsule po one to two times daliy  . fluticasone (FLONASE) 50 MCG/ACT nasal spray Place 1 spray into both nostrils daily.  . Galcanezumab-gnlm (EMGALITY) 120 MG/ML SOSY Inject 120 mg into the skin every 30 (thirty) days.  Marland Kitchen levothyroxine (SYNTHROID) 50 MCG tablet Take 1 tablet (50 mcg total) by mouth daily before breakfast.  . Multiple Vitamin (MULTIVITAMIN) tablet Take 1 tablet by mouth daily.  Marland Kitchen omeprazole (PRILOSEC) 40 MG capsule Take 1 tab po daily   . [DISCONTINUED] atorvastatin (LIPITOR) 10 MG tablet Take 1 tablet (10 mg total) by mouth at bedtime.  . [DISCONTINUED] cephALEXin (KEFLEX) 500 MG capsule Take 1 capsule (500 mg total) by mouth 3 (three) times daily. (Patient not taking: Reported on 06/30/2020)  . [DISCONTINUED] doxycycline (VIBRA-TABS) 100 MG tablet Take 1 tablet (100 mg total) by mouth daily.  . [DISCONTINUED] estradiol (ESTRACE) 0.1 MG/GM vaginal cream 1 gram in vagina two times a week  . [DISCONTINUED] fluconazole (DIFLUCAN) 150 MG tablet Take 1 tablet po once. May repeat dose in 3 days as needed for persistent symptoms. (Patient not taking: Reported on 06/30/2020)  . [DISCONTINUED] losartan (COZAAR) 100 MG tablet Take 1 tablet (100 mg total) by mouth daily.  . [DISCONTINUED] meloxicam (MOBIC) 15 MG tablet Take 1 tablet (15 mg total) by mouth daily as needed.  . [DISCONTINUED] tacrolimus (PROTOPIC) 0.1 % ointment Apply to affected areas on eyelids  . [DISCONTINUED] Ubrogepant (UBRELVY) 100 MG TABS Take 100 mg by mouth as needed. (Patient not taking: Reported on 06/30/2020)  . [DISCONTINUED] zolmitriptan (ZOMIG-ZMT) 5 MG disintegrating tablet May repeat after 2. Max dose is 2 tabs in 24 hrs.  . [DISCONTINUED] zolpidem (AMBIEN CR) 12.5 MG CR tablet Take 1 tablet (12.5 mg total) by mouth at bedtime as needed for sleep.   No facility-administered encounter medications on file as of 11/01/2020.    Surgical History: Past Surgical History:  Procedure Laterality Date  . BACK SURGERY    . CARPAL  TUNNEL RELEASE    . CARPAL TUNNEL RELEASE Bilateral 2005  . CHOLECYSTECTOMY    . COLPOSCOPY    . OOPHORECTOMY     RSO,LSO  . PELVIC LAPAROSCOPY  2001,2004   DL X 2 RSO and LSO  . TONSILLECTOMY    . VAGINAL HYSTERECTOMY  2000   endometriosis    Medical History: Past Medical History:  Diagnosis Date  . Allergy    environmental  . CIN I (cervical intraepithelial neoplasia I)   . Endometriosis   . GERD (gastroesophageal reflux  disease)   . Hyperlipidemia   . Hypertension   . Migraine   . Migraines   . Neck pain   . STD (sexually transmitted disease)    HSV ll    Family History: Family History  Problem Relation Age of Onset  . Hypertension Father   . Diabetes Mother   . Hypertension Mother   . Uterine cancer Mother   . Liver disease Mother   . Hypertension Brother   . Heart Problems Brother   . Breast cancer Maternal Aunt        Age 52's  . Aneurysm Maternal Aunt     Social History   Socioeconomic History  . Marital status: Married    Spouse name: Not on file  . Number of children: Not on file  . Years of education: Not on file  . Highest education level: Not on file  Occupational History  . Not on file  Tobacco Use  . Smoking status: Former Research scientist (life sciences)  . Smokeless tobacco: Never Used  Vaping Use  . Vaping Use: Never used  Substance and Sexual Activity  . Alcohol use: No    Alcohol/week: 0.0 standard drinks  . Drug use: No  . Sexual activity: Not Currently    Birth control/protection: Surgical    Comment: 1st intercourse 65 yo-More than 5 partners  Other Topics Concern  . Not on file  Social History Narrative  . Not on file   Social Determinants of Health   Financial Resource Strain: Not on file  Food Insecurity: Not on file  Transportation Needs: Not on file  Physical Activity: Not on file  Stress: Not on file  Social Connections: Not on file  Intimate Partner Violence: Not on file      Review of Systems  Constitutional: Negative for chills, diaphoresis and fatigue.  HENT: Positive for congestion, sinus pressure and sinus pain. Negative for ear pain and postnasal drip.   Eyes: Negative for photophobia, discharge, redness, itching and visual disturbance.  Respiratory: Positive for cough and chest tightness. Negative for shortness of breath and wheezing.   Cardiovascular: Negative for chest pain, palpitations and leg swelling.  Gastrointestinal: Negative for abdominal pain,  constipation, diarrhea, nausea and vomiting.  Genitourinary: Negative for dysuria and flank pain.  Musculoskeletal: Negative for arthralgias, back pain, gait problem and neck pain.  Skin: Negative for color change.  Allergic/Immunologic: Negative for environmental allergies and food allergies.  Neurological: Negative for dizziness and headaches.  Hematological: Does not bruise/bleed easily.  Psychiatric/Behavioral: Negative for agitation, behavioral problems (depression) and hallucinations.    Vital Signs: BP 138/88   Pulse 79   Temp (!) 96.8 F (36 C)   Resp 16   Ht 5\' 5"  (1.651 m)   Wt 161 lb 3.2 oz (73.1 kg)   SpO2 99%   BMI 26.83 kg/m    Physical Exam Vitals reviewed.  Constitutional:      Appearance: Normal appearance. She is normal  weight.  Cardiovascular:     Rate and Rhythm: Normal rate and regular rhythm.     Pulses: Normal pulses.     Heart sounds: Normal heart sounds.  Pulmonary:     Effort: Pulmonary effort is normal.     Breath sounds: Normal breath sounds.  Abdominal:     General: Abdomen is flat.  Musculoskeletal:        General: Normal range of motion.     Cervical back: Normal range of motion.  Skin:    General: Skin is warm.  Neurological:     General: No focal deficit present.     Mental Status: She is alert and oriented to person, place, and time. Mental status is at baseline.  Psychiatric:        Mood and Affect: Mood normal.        Behavior: Behavior normal.        Thought Content: Thought content normal.        Judgment: Judgment normal.    Assessment/Plan: 1. Acute recurrent pansinusitis Has completed course of azithromycin without relief or resolution of symptoms Complete course of Levaquin, advised to contact office if symptoms worsen or have not improved within 10 days--consider CXR - levofloxacin (LEVAQUIN) 500 MG tablet; Take 1 tablet (500 mg total) by mouth daily for 7 days.  Dispense: 7 tablet; Refill: 0  2. Essential (primary)  hypertension BP and HR remain well controlled, continue to monitor  3. Mixed hyperlipidemia Continue with statin therapy, tolerating well Consider further vascular studies  General Counseling: Kathy Mccormick verbalizes understanding of the findings of todays visit and agrees with plan of treatment. I have discussed any further diagnostic evaluation that may be needed or ordered today. We also reviewed her medications today. she has been encouraged to call the office with any questions or concerns that should arise related to todays visit.    Meds ordered this encounter  Medications  . levofloxacin (LEVAQUIN) 500 MG tablet    Sig: Take 1 tablet (500 mg total) by mouth daily for 7 days.    Dispense:  7 tablet    Refill:  0    Time spent: 30 Minutes Time spent includes review of chart, medications, test results and follow-up plan with the patient.  This patient was seen by Theodoro Grist AGNP-C in Collaboration with Dr Lavera Guise as a part of collaborative care agreement     Tanna Furry. Melford Tullier AGNP-C Internal medicine

## 2020-11-02 ENCOUNTER — Other Ambulatory Visit: Payer: Self-pay

## 2020-11-02 DIAGNOSIS — I10 Essential (primary) hypertension: Secondary | ICD-10-CM

## 2020-11-02 DIAGNOSIS — M150 Primary generalized (osteo)arthritis: Secondary | ICD-10-CM

## 2020-11-02 DIAGNOSIS — E782 Mixed hyperlipidemia: Secondary | ICD-10-CM

## 2020-11-02 MED ORDER — DOXYCYCLINE HYCLATE 100 MG PO TABS: 100.0000 mg | ORAL_TABLET | Freq: Every day | ORAL | 1 refills | Status: DC

## 2020-11-02 MED ORDER — LOSARTAN POTASSIUM 100 MG PO TABS: 100.0000 mg | ORAL_TABLET | Freq: Every day | ORAL | 1 refills | Status: DC

## 2020-11-02 MED ORDER — ATORVASTATIN CALCIUM 10 MG PO TABS: 10.0000 mg | ORAL_TABLET | Freq: Every day | ORAL | 1 refills | Status: DC

## 2020-11-02 MED ORDER — MELOXICAM 15 MG PO TABS: 15.0000 mg | ORAL_TABLET | Freq: Every day | ORAL | 1 refills | Status: DC | PRN

## 2020-11-03 ENCOUNTER — Encounter: Payer: Self-pay | Admitting: Hospice and Palliative Medicine

## 2020-11-04 ENCOUNTER — Other Ambulatory Visit: Payer: Self-pay

## 2020-11-04 MED ORDER — BENZONATATE 200 MG PO CAPS: 200.0000 mg | ORAL_CAPSULE | Freq: Two times a day (BID) | ORAL | 0 refills | Status: DC | PRN

## 2020-11-04 MED ORDER — FLUCONAZOLE 150 MG PO TABS: ORAL_TABLET | ORAL | 0 refills | Status: DC

## 2020-12-12 ENCOUNTER — Other Ambulatory Visit: Payer: Self-pay

## 2020-12-13 ENCOUNTER — Telehealth: Payer: Self-pay

## 2020-12-13 NOTE — Telephone Encounter (Signed)
Left vm to do covid screening for 12/14/20 appointment-Toni

## 2020-12-14 ENCOUNTER — Ambulatory Visit: Payer: 59 | Admitting: Nurse Practitioner

## 2020-12-14 ENCOUNTER — Other Ambulatory Visit: Payer: Self-pay

## 2020-12-14 ENCOUNTER — Encounter: Payer: Self-pay | Admitting: Nurse Practitioner

## 2020-12-14 VITALS — BP 138/92 | HR 90 | Temp 97.4°F | Resp 16 | Ht 65.0 in | Wt 160.6 lb

## 2020-12-14 DIAGNOSIS — K219 Gastro-esophageal reflux disease without esophagitis: Secondary | ICD-10-CM

## 2020-12-14 DIAGNOSIS — J309 Allergic rhinitis, unspecified: Secondary | ICD-10-CM | POA: Diagnosis not present

## 2020-12-14 DIAGNOSIS — I10 Essential (primary) hypertension: Secondary | ICD-10-CM

## 2020-12-14 DIAGNOSIS — M150 Primary generalized (osteo)arthritis: Secondary | ICD-10-CM

## 2020-12-14 DIAGNOSIS — L719 Rosacea, unspecified: Secondary | ICD-10-CM

## 2020-12-14 DIAGNOSIS — B009 Herpesviral infection, unspecified: Secondary | ICD-10-CM

## 2020-12-14 DIAGNOSIS — G43009 Migraine without aura, not intractable, without status migrainosus: Secondary | ICD-10-CM

## 2020-12-14 DIAGNOSIS — E782 Mixed hyperlipidemia: Secondary | ICD-10-CM | POA: Diagnosis not present

## 2020-12-14 DIAGNOSIS — F5101 Primary insomnia: Secondary | ICD-10-CM

## 2020-12-14 DIAGNOSIS — F410 Panic disorder [episodic paroxysmal anxiety] without agoraphobia: Secondary | ICD-10-CM

## 2020-12-14 DIAGNOSIS — E039 Hypothyroidism, unspecified: Secondary | ICD-10-CM

## 2020-12-14 DIAGNOSIS — F411 Generalized anxiety disorder: Secondary | ICD-10-CM

## 2020-12-14 MED ORDER — MELOXICAM 15 MG PO TABS: 15.0000 mg | ORAL_TABLET | Freq: Every day | ORAL | 1 refills | Status: DC | PRN

## 2020-12-14 MED ORDER — DOXYCYCLINE HYCLATE 100 MG PO TABS: 100.0000 mg | ORAL_TABLET | Freq: Every day | ORAL | 1 refills | Status: DC

## 2020-12-14 MED ORDER — DESONIDE 0.05 % EX CREA: 1.0000 "application " | TOPICAL_CREAM | Freq: Three times a day (TID) | CUTANEOUS | 2 refills | Status: DC | PRN

## 2020-12-14 MED ORDER — ALPRAZOLAM 0.5 MG PO TABS
0.5000 mg | ORAL_TABLET | Freq: Two times a day (BID) | ORAL | 2 refills | Status: DC | PRN
Start: 2020-12-14 — End: 2021-08-25

## 2020-12-14 MED ORDER — LEVOTHYROXINE SODIUM 50 MCG PO TABS
50.0000 ug | ORAL_TABLET | Freq: Every day | ORAL | 1 refills | Status: DC
Start: 2020-12-14 — End: 2021-06-15

## 2020-12-14 MED ORDER — ESTRADIOL 0.1 MG/GM VA CREA: TOPICAL_CREAM | VAGINAL | 9 refills | Status: DC

## 2020-12-14 MED ORDER — FLUTICASONE PROPIONATE 50 MCG/ACT NA SUSP: 1.0000 | Freq: Every day | NASAL | 1 refills | Status: DC

## 2020-12-14 MED ORDER — DULOXETINE HCL 60 MG PO CPEP
ORAL_CAPSULE | ORAL | 1 refills | Status: DC
Start: 2020-12-14 — End: 2021-06-15

## 2020-12-14 MED ORDER — OMEPRAZOLE 40 MG PO CPDR: DELAYED_RELEASE_CAPSULE | ORAL | 1 refills | Status: DC

## 2020-12-14 MED ORDER — EMGALITY 120 MG/ML ~~LOC~~ SOSY
120.0000 mg | PREFILLED_SYRINGE | SUBCUTANEOUS | 5 refills | Status: DC
Start: 2020-12-14 — End: 2021-01-09

## 2020-12-14 MED ORDER — CYCLOSPORINE 0.05 % OP EMUL: 1.0000 [drp] | Freq: Two times a day (BID) | OPHTHALMIC | 2 refills | Status: DC

## 2020-12-14 MED ORDER — ZOLMITRIPTAN 5 MG PO TBDP: ORAL_TABLET | ORAL | 3 refills | Status: DC

## 2020-12-14 MED ORDER — AZELAIC ACID 15 % EX GEL: CUTANEOUS | 0 refills | Status: DC

## 2020-12-14 MED ORDER — ACYCLOVIR 400 MG PO TABS: 400.0000 mg | ORAL_TABLET | Freq: Two times a day (BID) | ORAL | 1 refills | Status: DC

## 2020-12-14 MED ORDER — ZOLPIDEM TARTRATE ER 12.5 MG PO TBCR
12.5000 mg | EXTENDED_RELEASE_TABLET | Freq: Every evening | ORAL | 1 refills | Status: DC | PRN
Start: 2020-12-14 — End: 2021-06-19

## 2020-12-14 MED ORDER — ATORVASTATIN CALCIUM 10 MG PO TABS
10.0000 mg | ORAL_TABLET | Freq: Every day | ORAL | 1 refills | Status: DC
Start: 2020-12-14 — End: 2021-06-19

## 2020-12-14 NOTE — Progress Notes (Signed)
[facility] [address] [place], [place] [address]  Internal MEDICINE  Office Visit Note  Patient Name: Kathy Mccormick  [id number]  [id number]  Date of Service: 12/23/2020  Chief Complaint  Patient presents with   Follow-up    Refill request    Gastroesophageal Reflux   Hypertension   Hyperlipidemia   Quality Metric Gaps    Pneumovax, shingrix, covid booster    HPI Kathy Mccormick presents for a follow up visit to medication refills. She has a history of GERD, hypertension, hyperlipidemia, hypothyroidism, arthritis, migraines, anxiety and rosacea.  She denies any major complaints, is here t refill her medications   Current Medication: Outpatient Encounter Medications as of 12/14/2020  Medication Sig   CHERRY PO Take by mouth.   cycloSPORINE (RESTASIS MULTIDOSE) 0.05 % ophthalmic emulsion Place 1 drop into both eyes 2 (two) times daily.   losartan (COZAAR) 100 MG tablet Take 1 tablet (100 mg total) by mouth daily.   Multiple Vitamin (MULTIVITAMIN) tablet Take 1 tablet by mouth daily.   RESTASIS 0.05 % ophthalmic emulsion 1 drop 2 (two) times daily.   [DISCONTINUED] acyclovir (ZOVIRAX) 400 MG tablet Take 1 tablet (400 mg total) by mouth 2 (two) times daily.   [DISCONTINUED] ALPRAZolam (XANAX) 0.5 MG tablet Take 1 tablet (0.5 mg total) by mouth 2 (two) times daily as needed for anxiety.   [DISCONTINUED] atorvastatin (LIPITOR) 10 MG tablet Take 1 tablet (10 mg total) by mouth at bedtime.   [DISCONTINUED] Azelaic Acid 15 % cream After skin is thoroughly washed and patted dry, gently but thoroughly massage a thin film of azelaic acid cream into the affected area twice daily, in the morning and evening.   [DISCONTINUED] desonide (DESOWEN) 0.05 % cream Apply 1 application topically 3 (three) times daily as needed.   [DISCONTINUED] doxycycline (VIBRA-TABS) 100 MG tablet Take 1 tablet (100 mg total) by mouth daily.   [DISCONTINUED] DULoxetine (CYMBALTA) 60 MG capsule Take 1 capsule po  one to two times daliy   [DISCONTINUED] estradiol (ESTRACE) 0.1 MG/GM vaginal cream 1 gram in vagina two times a week   [DISCONTINUED] fluticasone (FLONASE) 50 MCG/ACT nasal spray Place 1 spray into both nostrils daily.   [DISCONTINUED] Galcanezumab-gnlm (EMGALITY) 120 MG/ML SOSY Inject 120 mg into the skin every 30 (thirty) days.   [DISCONTINUED] levothyroxine (SYNTHROID) 50 MCG tablet Take 1 tablet (50 mcg total) by mouth daily before breakfast.   [DISCONTINUED] meloxicam (MOBIC) 15 MG tablet Take 1 tablet (15 mg total) by mouth daily as needed.   [DISCONTINUED] omeprazole (PRILOSEC) 40 MG capsule Take 1 tab po daily   [DISCONTINUED] zolmitriptan (ZOMIG-ZMT) 5 MG disintegrating tablet Take 1 tablet by mouth PRN for migraines.  May repeat after 2-3 hrs.  Max dose is 2 tabs in 24 hrs   [DISCONTINUED] zolpidem (AMBIEN CR) 12.5 MG CR tablet Take 1 tablet (12.5 mg total) by mouth at bedtime as needed for sleep.   acyclovir (ZOVIRAX) 400 MG tablet Take 1 tablet (400 mg total) by mouth 2 (two) times daily.   ALPRAZolam (XANAX) 0.5 MG tablet Take 1 tablet (0.5 mg total) by mouth 2 (two) times daily as needed for anxiety.   atorvastatin (LIPITOR) 10 MG tablet Take 1 tablet (10 mg total) by mouth at bedtime.   Azelaic Acid 15 % gel After skin is thoroughly washed and patted dry, gently but thoroughly massage a thin film of azelaic acid cream into the affected area twice daily, in the morning and evening.   desonide (  DESOWEN) 0.05 % cream Apply 1 application topically 3 (three) times daily as needed.   doxycycline (VIBRA-TABS) 100 MG tablet Take 1 tablet (100 mg total) by mouth daily.   DULoxetine (CYMBALTA) 60 MG capsule Take 1 capsule po one to two times daliy   fluticasone (FLONASE) 50 MCG/ACT nasal spray Place 1 spray into both nostrils daily.   Galcanezumab-gnlm (EMGALITY) 120 MG/ML SOSY Inject 120 mg into the skin every 30 (thirty) days.   levothyroxine (SYNTHROID) 50 MCG tablet Take 1 tablet (50 mcg  total) by mouth daily before breakfast.   meloxicam (MOBIC) 15 MG tablet Take 1 tablet (15 mg total) by mouth daily as needed.   omeprazole (PRILOSEC) 40 MG capsule Take 1 tab po daily   zolmitriptan (ZOMIG-ZMT) 5 MG disintegrating tablet Take 1 tablet by mouth PRN for migraines.  May repeat after 2-3 hrs.  Max dose is 2 tabs in 24 hrs   zolpidem (AMBIEN CR) 12.5 MG CR tablet Take 1 tablet (12.5 mg total) by mouth at bedtime as needed for sleep.   [DISCONTINUED] benzonatate (TESSALON) 200 MG capsule Take 1 capsule (200 mg total) by mouth 2 (two) times daily as needed for cough. (Patient not taking: Reported on 12/14/2020)   [DISCONTINUED] chlorpheniramine-HYDROcodone (TUSSIONEX PENNKINETIC ER) 10-8 MG/5ML SUER Take 5 mLs by mouth every 12 (twelve) hours as needed for cough. (Patient not taking: Reported on 12/14/2020)   [DISCONTINUED] estradiol (ESTRACE) 0.1 MG/GM vaginal cream 1 gram in vagina two times a week   [DISCONTINUED] fluconazole (DIFLUCAN) 150 MG tablet Take 1 tablet by mouth once may repeat in 3 days if symptoms persist (Patient not taking: Reported on 12/14/2020)   No facility-administered encounter medications on file as of 12/14/2020.    Surgical History: Past Surgical History:  Procedure Laterality Date   BACK SURGERY     CARPAL TUNNEL RELEASE     CARPAL TUNNEL RELEASE Bilateral 2005   CHOLECYSTECTOMY     COLPOSCOPY     OOPHORECTOMY     RSO,LSO   PELVIC LAPAROSCOPY  2001,2004   DL X 2 RSO and LSO   TONSILLECTOMY     VAGINAL HYSTERECTOMY  2000   endometriosis    Medical History: Past Medical History:  Diagnosis Date   Allergy    environmental   CIN I (cervical intraepithelial neoplasia I)    Endometriosis    GERD (gastroesophageal reflux disease)    Hyperlipidemia    Hypertension    Migraine    Migraines    Neck pain    STD (sexually transmitted disease)    HSV ll    Family History: Family History  Problem Relation Age of Onset   Hypertension Father     Diabetes Mother    Hypertension Mother    Uterine cancer Mother    Liver disease Mother    Hypertension Brother    Heart Problems Brother    Breast cancer Maternal Aunt        Age 7's   Aneurysm Maternal Aunt     Social History   Socioeconomic History   Marital status: Married    Spouse name: Not on file   Number of children: Not on file   Years of education: Not on file   Highest education level: Not on file  Occupational History   Not on file  Tobacco Use   Smoking status: Former    Pack years: 0.00   Smokeless tobacco: Never  Vaping Use   Vaping Use: Never used  Substance and Sexual Activity   Alcohol use: No    Alcohol/week: 0.0 standard drinks   Drug use: No   Sexual activity: Not Currently    Birth control/protection: Surgical    Comment: 1st intercourse 65 yo-More than 5 partners  Other Topics Concern   Not on file  Social History Narrative   Not on file   Social Determinants of Health   Financial Resource Strain: Not on file  Food Insecurity: Not on file  Transportation Needs: Not on file  Physical Activity: Not on file  Stress: Not on file  Social Connections: Not on file  Intimate Partner Violence: Not on file      Review of Systems  Constitutional:  Negative for chills, fatigue and unexpected weight change.  HENT:  Negative for congestion, rhinorrhea, sneezing and sore throat.   Eyes:  Negative for redness.  Respiratory:  Negative for cough, chest tightness and shortness of breath.   Cardiovascular:  Negative for chest pain and palpitations.  Gastrointestinal:  Negative for abdominal pain, constipation, diarrhea, nausea and vomiting.  Genitourinary:  Negative for dysuria and frequency.  Musculoskeletal:  Negative for arthralgias, back pain, joint swelling and neck pain.  Skin:  Negative for rash.  Neurological: Negative.  Negative for tremors and numbness.  Hematological:  Negative for adenopathy. Does not bruise/bleed easily.   Psychiatric/Behavioral:  Negative for behavioral problems (Depression), sleep disturbance and suicidal ideas. The patient is not nervous/anxious.    Vital Signs: BP (!) 138/92 Comment: 138/104  Pulse 90   Temp (!) 97.4 F (36.3 C)   Resp 16   Ht 5\' 5"  (1.651 m)   Wt 160 lb 9.6 oz (72.8 kg)   SpO2 99%   BMI 26.73 kg/m    Physical Exam Vitals reviewed.  Constitutional:      General: She is not in acute distress.    Appearance: Normal appearance. She is well-developed and normal weight. She is not ill-appearing or diaphoretic.  HENT:     Head: Normocephalic and atraumatic.  Neck:     Thyroid: No thyromegaly.     Vascular: No JVD.     Trachea: No tracheal deviation.  Cardiovascular:     Rate and Rhythm: Normal rate and regular rhythm.     Pulses: Normal pulses.     Heart sounds: Normal heart sounds. No murmur heard.   No friction rub. No gallop.  Pulmonary:     Effort: Pulmonary effort is normal. No respiratory distress.     Breath sounds: Normal breath sounds. No wheezing or rales.  Chest:     Chest wall: No tenderness.  Skin:    General: Skin is warm and dry.     Capillary Refill: Capillary refill takes less than 2 seconds.  Neurological:     Mental Status: She is alert and oriented to person, place, and time.  Psychiatric:        Mood and Affect: Mood normal.        Behavior: Behavior normal.    Assessment/Plan: 1. Acquired hypothyroidism On levothyroxine for hypothyroidism, may need to check TSH level again.  - levothyroxine (SYNTHROID) 50 MCG tablet; Take 1 tablet (50 mcg total) by mouth daily before breakfast.  Dispense: 90 tablet; Refill: 1  2. Essential (primary) hypertension Hypertension stable on current medications, no refills needed.   3. Mixed hyperlipidemia Taking atorvastatin, no changes, refill ordered.  - atorvastatin (LIPITOR) 10 MG tablet; Take 1 tablet (10 mg total) by mouth at bedtime.  Dispense: 90  tablet; Refill: 1  4. Migraine without  aura and without status migrainosus, not intractable Manageable on current medications, no changes, refills ordered. - Galcanezumab-gnlm (EMGALITY) 120 MG/ML SOSY; Inject 120 mg into the skin every 30 (thirty) days.  Dispense: 1 mL; Refill: 5 - zolmitriptan (ZOMIG-ZMT) 5 MG disintegrating tablet; Take 1 tablet by mouth PRN for migraines.  May repeat after 2-3 hrs.  Max dose is 2 tabs in 24 hrs  Dispense: 10 tablet; Refill: 3  5. Generalized anxiety disorder with panic attacks Continue all meds as before  - ALPRAZolam (XANAX) 0.5 MG tablet; Take 1 tablet (0.5 mg total) by mouth 2 (two) times daily as needed for anxiety.  Dispense: 60 tablet; Refill: 2 - DULoxetine (CYMBALTA) 60 MG capsule; Take 1 capsule po one to two times daliy  Dispense: 180 capsule; Refill: 1  6. Rosacea Topical and oral medications for rosacea reordered.  - doxycycline (VIBRA-TABS) 100 MG tablet; Take 1 tablet (100 mg total) by mouth daily.  Dispense: 90 tablet; Refill: 1 - desonide (DESOWEN) 0.05 % cream; Apply 1 application topically 3 (three) times daily as needed.  Dispense: 60 g; Refill: 2 - Azelaic Acid 15 % gel; After skin is thoroughly washed and patted dry, gently but thoroughly massage a thin film of azelaic acid cream into the affected area twice daily, in the morning and evening.  Dispense: 50 g; Refill: 0  7. Herpes simplex disease Stable, acyclovir refilled - acyclovir (ZOVIRAX) 400 MG tablet; Take 1 tablet (400 mg total) by mouth 2 (two) times daily.  Dispense: 180 tablet; Refill: 1  8. Primary generalized (osteo)arthritis Stable, meloxicam reordered.  - meloxicam (MOBIC) 15 MG tablet; Take 1 tablet (15 mg total) by mouth daily as needed.  Dispense: 90 tablet; Refill: 1  9. Primary insomnia Stable, zolpidem reordered.  - zolpidem (AMBIEN CR) 12.5 MG CR tablet; Take 1 tablet (12.5 mg total) by mouth at bedtime as needed for sleep.  Dispense: 90 tablet; Refill: 1  10. Allergic rhinitis, unspecified  seasonality, unspecified trigger Nasal spray and ophthalmic drops for dry eye reordered.  - fluticasone (FLONASE) 50 MCG/ACT nasal spray; Place 1 spray into both nostrils daily.  Dispense: 48 g; Refill: 1 - cycloSPORINE (RESTASIS MULTIDOSE) 0.05 % ophthalmic emulsion; Place 1 drop into both eyes 2 (two) times daily.  Dispense: 0.4 mL; Refill: 2  11. Gastroesophageal reflux disease without esophagitis Taking omeprazole, stable, med refilled.  - omeprazole (PRILOSEC) 40 MG capsule; Take 1 tab po daily  Dispense: 90 capsule; Refill: 1   General Counseling: Lexi verbalizes understanding of the findings of todays visit and agrees with plan of treatment. I have discussed any further diagnostic evaluation that may be needed or ordered today. We also reviewed her medications today. she has been encouraged to call the office with any questions or concerns that should arise related to todays visit.    No orders of the defined types were placed in this encounter.   Meds ordered this encounter  Medications   levothyroxine (SYNTHROID) 50 MCG tablet    Sig: Take 1 tablet (50 mcg total) by mouth daily before breakfast.    Dispense:  90 tablet    Refill:  1   acyclovir (ZOVIRAX) 400 MG tablet    Sig: Take 1 tablet (400 mg total) by mouth 2 (two) times daily.    Dispense:  180 tablet    Refill:  1   meloxicam (MOBIC) 15 MG tablet    Sig: Take 1 tablet (15  mg total) by mouth daily as needed.    Dispense:  90 tablet    Refill:  1   Galcanezumab-gnlm (EMGALITY) 120 MG/ML SOSY    Sig: Inject 120 mg into the skin every 30 (thirty) days.    Dispense:  1 mL    Refill:  5   zolpidem (AMBIEN CR) 12.5 MG CR tablet    Sig: Take 1 tablet (12.5 mg total) by mouth at bedtime as needed for sleep.    Dispense:  90 tablet    Refill:  1    This is 90 day prescription   doxycycline (VIBRA-TABS) 100 MG tablet    Sig: Take 1 tablet (100 mg total) by mouth daily.    Dispense:  90 tablet    Refill:  1    atorvastatin (LIPITOR) 10 MG tablet    Sig: Take 1 tablet (10 mg total) by mouth at bedtime.    Dispense:  90 tablet    Refill:  1   fluticasone (FLONASE) 50 MCG/ACT nasal spray    Sig: Place 1 spray into both nostrils daily.    Dispense:  48 g    Refill:  1   desonide (DESOWEN) 0.05 % cream    Sig: Apply 1 application topically 3 (three) times daily as needed.    Dispense:  60 g    Refill:  2   ALPRAZolam (XANAX) 0.5 MG tablet    Sig: Take 1 tablet (0.5 mg total) by mouth 2 (two) times daily as needed for anxiety.    Dispense:  60 tablet    Refill:  2   zolmitriptan (ZOMIG-ZMT) 5 MG disintegrating tablet    Sig: Take 1 tablet by mouth PRN for migraines.  May repeat after 2-3 hrs.  Max dose is 2 tabs in 24 hrs    Dispense:  10 tablet    Refill:  3   omeprazole (PRILOSEC) 40 MG capsule    Sig: Take 1 tab po daily    Dispense:  90 capsule    Refill:  1   DISCONTD: estradiol (ESTRACE) 0.1 MG/GM vaginal cream    Sig: 1 gram in vagina two times a week    Dispense:  42.5 g    Refill:  9   DULoxetine (CYMBALTA) 60 MG capsule    Sig: Take 1 capsule po one to two times daliy    Dispense:  180 capsule    Refill:  1    Please note increase in dosing.   Azelaic Acid 15 % gel    Sig: After skin is thoroughly washed and patted dry, gently but thoroughly massage a thin film of azelaic acid cream into the affected area twice daily, in the morning and evening.    Dispense:  50 g    Refill:  0   cycloSPORINE (RESTASIS MULTIDOSE) 0.05 % ophthalmic emulsion    Sig: Place 1 drop into both eyes 2 (two) times daily.    Dispense:  0.4 mL    Refill:  2    Return in about 2 months (around 02/13/2021) for CPE, Brees Hounshell PCP, med refill.   Total time spent:30 Minutes Time spent includes review of chart, medications, test results, and follow up plan with the patient.   [place] Controlled Substance Database was reviewed by me.  This patient was seen by Jonetta Osgood, FNP-C in collaboration with Dr.  Clayborn Bigness as a part of collaborative care agreement.   Dabney Schanz R. Valetta Fuller, MSN, FNP-C Internal medicine

## 2021-01-08 ENCOUNTER — Other Ambulatory Visit: Payer: Self-pay | Admitting: Nurse Practitioner

## 2021-01-08 DIAGNOSIS — G43009 Migraine without aura, not intractable, without status migrainosus: Secondary | ICD-10-CM

## 2021-01-24 ENCOUNTER — Telehealth: Payer: Self-pay

## 2021-01-24 NOTE — Telephone Encounter (Signed)
Pt called that emgality  need PA advised her to let phar send again and we sample she can pickup

## 2021-01-26 ENCOUNTER — Other Ambulatory Visit: Payer: Self-pay | Admitting: Nurse Practitioner

## 2021-01-26 DIAGNOSIS — G43009 Migraine without aura, not intractable, without status migrainosus: Secondary | ICD-10-CM

## 2021-02-10 ENCOUNTER — Telehealth: Payer: Self-pay

## 2021-02-10 NOTE — Telephone Encounter (Signed)
PA sent thru cover my meds 02/10/21 at 151 pm

## 2021-02-13 ENCOUNTER — Telehealth: Payer: Self-pay

## 2021-02-13 NOTE — Telephone Encounter (Signed)
PA for [facility] was denied.  Will check with provider to see if they want to appeal or change medication

## 2021-02-14 ENCOUNTER — Telehealth: Payer: Self-pay

## 2021-02-14 NOTE — Telephone Encounter (Signed)
PA resent to cover my meds at 508 pm on 02/14/2021

## 2021-02-16 ENCOUNTER — Ambulatory Visit: Payer: 59 | Admitting: Nurse Practitioner

## 2021-03-20 ENCOUNTER — Other Ambulatory Visit: Payer: Self-pay

## 2021-03-20 DIAGNOSIS — G43009 Migraine without aura, not intractable, without status migrainosus: Secondary | ICD-10-CM

## 2021-03-20 MED ORDER — EMGALITY 120 MG/ML ~~LOC~~ SOSY: 120.0000 mg | PREFILLED_SYRINGE | SUBCUTANEOUS | 5 refills | Status: DC

## 2021-03-20 NOTE — Telephone Encounter (Signed)
Received from [employer] decision notes advising the appeal was reviewed and they overturned the decision to APPROVED for [facility] Injection 120 mg.   Pt was informed and a new prescription was sent to [facility] [address].   APPROVED for 6 months 02/10/21 to 09/15/2021

## 2021-03-23 ENCOUNTER — Other Ambulatory Visit: Payer: Self-pay | Admitting: Nurse Practitioner

## 2021-03-23 DIAGNOSIS — G43009 Migraine without aura, not intractable, without status migrainosus: Secondary | ICD-10-CM

## 2021-03-27 ENCOUNTER — Other Ambulatory Visit: Payer: Self-pay

## 2021-03-27 ENCOUNTER — Other Ambulatory Visit: Payer: Self-pay | Admitting: Nurse Practitioner

## 2021-03-27 ENCOUNTER — Ambulatory Visit: Payer: 59 | Admitting: Dermatology

## 2021-03-27 DIAGNOSIS — L820 Inflamed seborrheic keratosis: Secondary | ICD-10-CM | POA: Diagnosis not present

## 2021-03-27 DIAGNOSIS — L578 Other skin changes due to chronic exposure to nonionizing radiation: Secondary | ICD-10-CM

## 2021-03-27 DIAGNOSIS — L720 Epidermal cyst: Secondary | ICD-10-CM

## 2021-03-27 DIAGNOSIS — L719 Rosacea, unspecified: Secondary | ICD-10-CM

## 2021-03-27 DIAGNOSIS — L570 Actinic keratosis: Secondary | ICD-10-CM | POA: Diagnosis not present

## 2021-03-27 NOTE — Patient Instructions (Addendum)
If you have any questions or concerns for your doctor, please call our main line at [phone] and press option 4 to reach your doctor's medical assistant. If no one answers, please leave a voicemail as directed and we will return your call as soon as possible. Messages left after 4 pm will be answered the following business day.   You may also send us a message via MyChart. We typically respond to MyChart messages within 1-2 business days.  For prescription refills, please ask your pharmacy to contact our office. Our fax number is [phone].  If you have an urgent issue when the clinic is closed that cannot wait until the next business day, you can page your doctor at the number below.    Please note that while we do our best to be available for urgent issues outside of office hours, we are not available 24/7.   If you have an urgent issue and are unable to reach us, you may choose to seek medical care at your doctor's office, retail clinic, urgent care center, or emergency room.  If you have a medical emergency, please immediately call 911 or go to the emergency department.  Pager Numbers  - Dr. Kowalski: [phone]  - Dr. Moye: [phone]  - Dr. Stewart: [phone]  In the event of inclement weather, please call our main line at [phone] for an update on the status of any delays or closures.  Dermatology Medication Tips: Please keep the boxes that topical medications come in in order to help keep track of the instructions about where and how to use these. Pharmacies typically print the medication instructions only on the boxes and not directly on the medication tubes.   If your medication is too expensive, please contact our office at [phone] option 4 or send us a message through MyChart.   We are unable to tell what your co-pay for medications will be in advance as this is different depending on your insurance coverage. However, we may be able to find a substitute  medication at lower cost or fill out paperwork to get insurance to cover a needed medication.   If a prior authorization is required to get your medication covered by your insurance company, please allow us 1-2 business days to complete this process.  Drug prices often vary depending on where the prescription is filled and some pharmacies may offer cheaper prices.  The website www.goodrx.com contains coupons for medications through different pharmacies. The prices here do not account for what the cost may be with help from insurance (it may be cheaper with your insurance), but the website can give you the price if you did not use any insurance.  - You can print the associated coupon and take it with your prescription to the pharmacy.  - You may also stop by our office during regular business hours and pick up a GoodRx coupon card.  - If you need your prescription sent electronically to a different pharmacy, notify our office through [facility] MyChart or by phone at [phone] option 4.        Pre-Operative Instructions  You are scheduled for a surgical procedure at [facility]. We recommend you read the following instructions. If you have any questions or concerns, please call the office at [phone].  Shower and wash the entire body with soap and water the day of your surgery paying special attention to cleansing at and around the planned surgery site.  Avoid aspirin or aspirin containing products   at least fourteen (14) days prior to your surgical procedure and for at least one week (7 Days) after your surgical procedure. If you take aspirin on a regular basis for heart disease or history of stroke or for any other reason, we may recommend you continue taking aspirin but please notify us if you take this on a regular basis. Aspirin can cause more bleeding to occur during surgery as well as prolonged bleeding and bruising after surgery.   Avoid other nonsteroidal pain  medications at least one week prior to surgery and at least one week prior to your surgery. These include medications such as Ibuprofen (Motrin, Advil and Nuprin), Naprosyn, Voltaren, Relafen, etc. If medications are used for therapeutic reasons, please inform us as they can cause increased bleeding or prolonged bleeding during and bruising after surgical procedures.   Please advise us if you are taking any "blood thinner" medications such as Coumadin or Dipyridamole or Plavix or similar medications. These cause increased bleeding and prolonged bleeding during procedures and bruising after surgical procedures. We may have to consider discontinuing these medications briefly prior to and shortly after your surgery if safe to do so.   Please inform us of all medications you are currently taking. All medications that are taken regularly should be taken the day of surgery as you always do. Nevertheless, we need to be informed of what medications you are taking prior to surgery to know whether they will affect the procedure or cause any complications.   Please inform us of any medication allergies. Also inform us of whether you have allergies to Latex or rubber products or whether you have had any adverse reaction to Lidocaine or Epinephrine.  Please inform us of any prosthetic or artificial body parts such as artificial heart valve, joint replacements, etc., or similar condition that might require preoperative antibiotics.   We recommend avoidance of alcohol at least two weeks prior to surgery and continued avoidance for at least two weeks after surgery.   We recommend discontinuation of tobacco smoking at least two weeks prior to surgery and continued abstinence for at least two weeks after surgery.  Do not plan strenuous exercise, strenuous work or strenuous lifting for approximately four weeks after your surgery.   We request if you are unable to make your scheduled surgical appointment, please call us  at least a week in advance or as soon as you are aware of a problem so that we can cancel or reschedule the appointment.   You MAY TAKE TYLENOL (acetaminophen) for pain as it is not a blood thinner.   PLEASE PLAN TO BE IN TOWN FOR TWO WEEKS FOLLOWING SURGERY, THIS IS IMPORTANT SO YOU CAN BE CHECKED FOR DRESSING CHANGES, SUTURE REMOVAL AND TO MONITOR FOR POSSIBLE COMPLICATIONS.  

## 2021-03-27 NOTE — Progress Notes (Signed)
   Follow-Up Visit   Subjective  Kathy Mccormick is a 65 y.o. female who presents for the following: irregular skin lesions (On the L cheek, R breast, and back she would like areas checked and treated if necessary ).  She also has other areas to be evaluated today.  The following portions of the chart were reviewed this encounter and updated as appropriate:   Tobacco  Allergies  Meds  Problems  Med Hx  Surg Hx  Fam Hx     Review of Systems:  No other skin or systemic complaints except as noted in HPI or Assessment and Plan.  Objective  Well appearing patient in no apparent distress; mood and affect are within normal limits.  A focused examination was performed including the face, trunk, and extremities. Relevant physical exam findings are noted in the Assessment and Plan.  L cheek x Erythematous thin papules/macules with gritty scale.   R sup med breast x 1 Erythematous keratotic or waxy stuck-on papule or plaque.   L back Firm SQ nodule 1.2 cm.   Assessment & Plan  AK (actinic keratosis) L cheek x 1  Destruction of lesion - L cheek x1 Complexity: simple   Destruction method: cryotherapy   Informed consent: discussed and consent obtained   Timeout:  patient name, date of birth, surgical site, and procedure verified Lesion destroyed using liquid nitrogen: Yes   Region frozen until ice ball extended beyond lesion: Yes   Outcome: patient tolerated procedure well with no complications   Post-procedure details: wound care instructions given    Inflamed seborrheic keratosis R sup med breast x 1  Destruction of lesion - R sup med breast x 1 Complexity: simple   Destruction method: cryotherapy   Informed consent: discussed and consent obtained   Timeout:  patient name, date of birth, surgical site, and procedure verified Lesion destroyed using liquid nitrogen: Yes   Region frozen until ice ball extended beyond lesion: Yes   Outcome: patient tolerated procedure well with  no complications   Post-procedure details: wound care instructions given    Epidermal inclusion cyst L back  Benign-appearing. Exam most consistent with an epidermal inclusion cyst. Discussed that a cyst is a benign growth that can grow over time and sometimes get irritated or inflamed. Recommend observation if it is not bothersome. Discussed option of surgical excision to remove it if it is growing, symptomatic, or other changes noted. Please call for new or changing lesions so they can be evaluated.  Actinic Damage - chronic, secondary to cumulative UV radiation exposure/sun exposure over time - diffuse scaly erythematous macules with underlying dyspigmentation - Recommend daily broad spectrum sunscreen SPF 30+ to sun-exposed areas, reapply every 2 hours as needed.  - Recommend staying in the shade or wearing long sleeves, sun glasses (UVA+UVB protection) and wide brim hats (4-inch brim around the entire circumference of the hat). - Call for new or changing lesions.  Return for follow up in 2-4 mths; surgery for cyst excision .  Luther Redo, CMA, am acting as scribe for Sarina Ser, MD . Documentation: I have reviewed the above documentation for accuracy and completeness, and I agree with the above.  Sarina Ser, MD

## 2021-03-30 ENCOUNTER — Encounter: Payer: Self-pay | Admitting: Dermatology

## 2021-05-12 ENCOUNTER — Encounter: Payer: Self-pay | Admitting: Dermatology

## 2021-05-30 ENCOUNTER — Ambulatory Visit: Payer: 59 | Admitting: Nurse Practitioner

## 2021-06-06 ENCOUNTER — Other Ambulatory Visit: Payer: Self-pay | Admitting: Surgery

## 2021-06-15 ENCOUNTER — Other Ambulatory Visit: Payer: Self-pay | Admitting: Nurse Practitioner

## 2021-06-15 ENCOUNTER — Ambulatory Visit: Payer: 59 | Admitting: Nurse Practitioner

## 2021-06-15 ENCOUNTER — Other Ambulatory Visit: Payer: Self-pay | Admitting: Internal Medicine

## 2021-06-15 DIAGNOSIS — M150 Primary generalized (osteo)arthritis: Secondary | ICD-10-CM

## 2021-06-15 DIAGNOSIS — B009 Herpesviral infection, unspecified: Secondary | ICD-10-CM

## 2021-06-15 DIAGNOSIS — E039 Hypothyroidism, unspecified: Secondary | ICD-10-CM

## 2021-06-15 DIAGNOSIS — K219 Gastro-esophageal reflux disease without esophagitis: Secondary | ICD-10-CM

## 2021-06-15 DIAGNOSIS — F410 Panic disorder [episodic paroxysmal anxiety] without agoraphobia: Secondary | ICD-10-CM

## 2021-06-15 DIAGNOSIS — I10 Essential (primary) hypertension: Secondary | ICD-10-CM

## 2021-06-15 DIAGNOSIS — L719 Rosacea, unspecified: Secondary | ICD-10-CM

## 2021-06-19 ENCOUNTER — Encounter: Payer: Self-pay | Admitting: Nurse Practitioner

## 2021-06-19 ENCOUNTER — Ambulatory Visit: Payer: 59 | Admitting: Nurse Practitioner

## 2021-06-19 ENCOUNTER — Other Ambulatory Visit: Payer: Self-pay

## 2021-06-19 VITALS — BP 135/90 | HR 78 | Temp 98.4°F | Resp 16 | Ht 65.0 in | Wt 157.6 lb

## 2021-06-19 DIAGNOSIS — G43009 Migraine without aura, not intractable, without status migrainosus: Secondary | ICD-10-CM | POA: Diagnosis not present

## 2021-06-19 DIAGNOSIS — R0781 Pleurodynia: Secondary | ICD-10-CM | POA: Diagnosis not present

## 2021-06-19 DIAGNOSIS — E782 Mixed hyperlipidemia: Secondary | ICD-10-CM | POA: Diagnosis not present

## 2021-06-19 DIAGNOSIS — F5101 Primary insomnia: Secondary | ICD-10-CM

## 2021-06-19 MED ORDER — ATORVASTATIN CALCIUM 10 MG PO TABS: 10.0000 mg | ORAL_TABLET | Freq: Every day | ORAL | 1 refills | Status: DC

## 2021-06-19 MED ORDER — ZOLPIDEM TARTRATE ER 12.5 MG PO TBCR: 12.5000 mg | EXTENDED_RELEASE_TABLET | Freq: Every evening | ORAL | 0 refills | Status: DC | PRN

## 2021-06-19 MED ORDER — PREDNISONE 50 MG PO TABS: 50.0000 mg | ORAL_TABLET | Freq: Every day | ORAL | 0 refills | Status: AC

## 2021-06-19 MED ORDER — KETOROLAC TROMETHAMINE 10 MG PO TABS: 10.0000 mg | ORAL_TABLET | Freq: Four times a day (QID) | ORAL | 0 refills | Status: DC | PRN

## 2021-06-19 MED ORDER — ZOLMITRIPTAN 5 MG PO TBDP: ORAL_TABLET | ORAL | 3 refills | Status: DC
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
## 2021-06-20 ENCOUNTER — Other Ambulatory Visit: Payer: Self-pay

## 2021-06-20 ENCOUNTER — Telehealth: Payer: Self-pay

## 2021-06-20 MED ORDER — METHYLPREDNISOLONE ACETATE 80 MG/ML IJ SUSP
80.0000 mg | Freq: Once | INTRAMUSCULAR | Status: AC
  Administered 2021-06-19: 04:00:00 80 mg via INTRAMUSCULAR

## 2021-06-20 MED ORDER — TRAMADOL HCL 50 MG PO TABS: 50.0000 mg | ORAL_TABLET | Freq: Three times a day (TID) | ORAL | 0 refills | Status: AC | PRN

## 2021-06-21 NOTE — Telephone Encounter (Signed)
Lmom that we send tramadol

## 2021-07-21 ENCOUNTER — Encounter: Payer: Self-pay | Admitting: Nurse Practitioner

## 2021-08-14 ENCOUNTER — Other Ambulatory Visit: Payer: Self-pay | Admitting: Obstetrics and Gynecology

## 2021-08-14 ENCOUNTER — Other Ambulatory Visit: Payer: Self-pay | Admitting: Nurse Practitioner

## 2021-08-14 DIAGNOSIS — Z1231 Encounter for screening mammogram for malignant neoplasm of breast: Secondary | ICD-10-CM

## 2021-08-24 ENCOUNTER — Other Ambulatory Visit: Payer: Self-pay | Admitting: Nurse Practitioner

## 2021-08-24 DIAGNOSIS — F411 Generalized anxiety disorder: Secondary | ICD-10-CM

## 2021-08-24 DIAGNOSIS — L719 Rosacea, unspecified: Secondary | ICD-10-CM

## 2021-08-24 DIAGNOSIS — F410 Panic disorder [episodic paroxysmal anxiety] without agoraphobia: Secondary | ICD-10-CM

## 2021-08-24 DIAGNOSIS — F5101 Primary insomnia: Secondary | ICD-10-CM

## 2021-08-24 HISTORY — PX: TRIGGER FINGER RELEASE: SHX641

## 2021-08-25 NOTE — Telephone Encounter (Signed)
meds sent to pharmacy

## 2021-08-27 ENCOUNTER — Other Ambulatory Visit: Payer: Self-pay | Admitting: Nurse Practitioner

## 2021-08-27 DIAGNOSIS — G43009 Migraine without aura, not intractable, without status migrainosus: Secondary | ICD-10-CM

## 2021-09-06 ENCOUNTER — Ambulatory Visit (INDEPENDENT_AMBULATORY_CARE_PROVIDER_SITE_OTHER): Payer: 59 | Admitting: Nurse Practitioner

## 2021-09-06 ENCOUNTER — Telehealth: Payer: Self-pay

## 2021-09-06 ENCOUNTER — Encounter: Payer: Self-pay | Admitting: Nurse Practitioner

## 2021-09-06 ENCOUNTER — Other Ambulatory Visit: Payer: Self-pay

## 2021-09-06 VITALS — BP 140/80 | HR 85 | Temp 98.8°F | Resp 16 | Ht 65.0 in | Wt 161.8 lb

## 2021-09-06 DIAGNOSIS — R079 Chest pain, unspecified: Secondary | ICD-10-CM

## 2021-09-06 DIAGNOSIS — F411 Generalized anxiety disorder: Secondary | ICD-10-CM

## 2021-09-06 DIAGNOSIS — F410 Panic disorder [episodic paroxysmal anxiety] without agoraphobia: Secondary | ICD-10-CM

## 2021-09-06 DIAGNOSIS — R9431 Abnormal electrocardiogram [ECG] [EKG]: Secondary | ICD-10-CM | POA: Diagnosis not present

## 2021-09-06 DIAGNOSIS — M940 Chondrocostal junction syndrome [Tietze]: Secondary | ICD-10-CM | POA: Diagnosis not present

## 2021-09-06 MED ORDER — METHYLPREDNISOLONE ACETATE 80 MG/ML IJ SUSP
80.0000 mg | Freq: Once | INTRAMUSCULAR | Status: AC
  Administered 2021-09-06: 60 mg via INTRAMUSCULAR

## 2021-09-06 MED ORDER — BUSPIRONE HCL 5 MG PO TABS: 5.0000 mg | ORAL_TABLET | Freq: Two times a day (BID) | ORAL | 1 refills | Status: DC
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
## 2021-09-06 NOTE — Telephone Encounter (Signed)
Patient called requesting sooner appointment offered her an appointment this morning at 11:20 and patient declined. Advised patient if chest pain or discomfort worsens to go to the ED. ?

## 2021-09-07 ENCOUNTER — Ambulatory Visit: Payer: 59 | Admitting: Nurse Practitioner

## 2021-09-07 ENCOUNTER — Ambulatory Visit: Payer: 59 | Admitting: Physician Assistant

## 2021-09-10 ENCOUNTER — Encounter: Payer: Self-pay | Admitting: Nurse Practitioner

## 2021-09-11 ENCOUNTER — Telehealth: Payer: Self-pay

## 2021-09-18 ENCOUNTER — Ambulatory Visit: Payer: 59 | Admitting: Nurse Practitioner

## 2021-09-19 ENCOUNTER — Telehealth: Payer: Self-pay

## 2021-09-19 ENCOUNTER — Ambulatory Visit
Admission: RE | Admit: 2021-09-19 | Discharge: 2021-09-19 | Disposition: A | Discharge status: Home / Self Care | Payer: 59 | Source: Ambulatory Visit | Attending: Nurse Practitioner | Admitting: Nurse Practitioner

## 2021-09-19 DIAGNOSIS — M940 Chondrocostal junction syndrome [Tietze]: Secondary | ICD-10-CM | POA: Diagnosis present

## 2021-09-19 NOTE — Telephone Encounter (Signed)
Lvm notifying patient 09/27/21 ultrasound appointment has been moved to 10/25/21-Toni ?

## 2021-09-21 ENCOUNTER — Other Ambulatory Visit: Payer: Self-pay

## 2021-09-21 ENCOUNTER — Ambulatory Visit (INDEPENDENT_AMBULATORY_CARE_PROVIDER_SITE_OTHER): Payer: 59 | Admitting: Nurse Practitioner

## 2021-09-21 ENCOUNTER — Telehealth: Payer: Self-pay

## 2021-09-21 ENCOUNTER — Encounter: Payer: Self-pay | Admitting: Nurse Practitioner

## 2021-09-21 VITALS — BP 124/82 | Ht 65.0 in | Wt 162.0 lb

## 2021-09-21 DIAGNOSIS — Z833 Family history of diabetes mellitus: Secondary | ICD-10-CM

## 2021-09-21 DIAGNOSIS — Z01419 Encounter for gynecological examination (general) (routine) without abnormal findings: Secondary | ICD-10-CM

## 2021-09-21 DIAGNOSIS — E559 Vitamin D deficiency, unspecified: Secondary | ICD-10-CM | POA: Diagnosis not present

## 2021-09-21 DIAGNOSIS — L292 Pruritus vulvae: Secondary | ICD-10-CM

## 2021-09-21 DIAGNOSIS — Z13820 Encounter for screening for osteoporosis: Secondary | ICD-10-CM | POA: Diagnosis not present

## 2021-09-21 DIAGNOSIS — Z780 Asymptomatic menopausal state: Secondary | ICD-10-CM | POA: Diagnosis not present

## 2021-09-21 DIAGNOSIS — F419 Anxiety disorder, unspecified: Secondary | ICD-10-CM

## 2021-09-21 DIAGNOSIS — N952 Postmenopausal atrophic vaginitis: Secondary | ICD-10-CM

## 2021-09-21 DIAGNOSIS — Z8349 Family history of other endocrine, nutritional and metabolic diseases: Secondary | ICD-10-CM

## 2021-09-21 LAB — WET PREP FOR TRICH, YEAST, CLUE

## 2021-09-21 MED ORDER — ESTRADIOL 0.1 MG/GM VA CREA: 1.0000 | TOPICAL_CREAM | VAGINAL | 2 refills | Status: DC

## 2021-09-21 MED ORDER — FLUCONAZOLE 150 MG PO TABS: ORAL_TABLET | ORAL | 0 refills | Status: DC

## 2021-09-21 NOTE — Telephone Encounter (Signed)
Pt called c/o possible yeast infection and is requesting diflucan x 3 tablets.  Sent Alyssa a message  ?

## 2021-09-21 NOTE — Telephone Encounter (Signed)
Pt called this morning she is having yeast infection as per alyssa send diflucan  ?
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
## 2021-09-22 ENCOUNTER — Ambulatory Visit: Payer: 59

## 2021-09-22 ENCOUNTER — Telehealth: Payer: Self-pay

## 2021-09-22 DIAGNOSIS — R300 Dysuria: Secondary | ICD-10-CM

## 2021-09-22 NOTE — Telephone Encounter (Signed)
Kathy Gammon, NP  [facility] Triage ?She is looking for a therapist but has had trouble finding one in [place] that is taking new patients. I told her I would ask if you guys have any recommendations. She is ok with driving to [place] side of [place] if needed. Thank you.  ? ?Advised pt I was not aware of any therapists located in [place] area but I did some research for her and found a couple options.  ? ?Ciro Backer [phone] ?[facility] [phone] ? ?Pt voiced understanding. Pt also is concerned about urinalysis test that was asked to be performed yesterday at OV due to lower pelvic pressure, increased frequency of urination, and dysuria.  ? ?Pt notified at that time Tiffany didn't know of any sxs related to UTI so did not perform. Pt was upset when telling her she could come and leave a sample and see another provider at that time due to the fact that she was under the impression that it would get done yesterday and she would also have to pay a $60 copay for a visit.  ? ?Pt inquiring if she could just come by and leave a sample and be called of results to avoid having to pay for something she felt should have been done while she was here yesterday.? Please advise.  ? ? ? ?

## 2021-09-22 NOTE — Telephone Encounter (Signed)
FYI. Patient coming in today, should be here by 1230. Ill take care of it if no one else is available. Thanks.  ?

## 2021-09-22 NOTE — Telephone Encounter (Signed)
Please run by Sharee Pimple and see what she recommends.

## 2021-09-24 LAB — URINALYSIS, COMPLETE W/RFL CULTURE
Bacteria, UA: NONE SEEN /HPF
Glucose, UA: NEGATIVE
Hgb urine dipstick: NEGATIVE
Hyaline Cast: NONE SEEN /LPF
Nitrites, Initial: NEGATIVE
Protein, ur: NEGATIVE
RBC / HPF: NONE SEEN /HPF (ref 0–2)
Specific Gravity, Urine: 1.015 (ref 1.001–1.035)
WBC, UA: NONE SEEN /HPF (ref 0–5)
pH: 5.5 (ref 5.0–8.0)

## 2021-09-24 LAB — URINE CULTURE
MICRO NUMBER:: 13176594
SPECIMEN QUALITY:: ADEQUATE

## 2021-09-24 LAB — CULTURE INDICATED

## 2021-09-26 ENCOUNTER — Telehealth: Payer: Self-pay

## 2021-09-26 NOTE — Telephone Encounter (Signed)
Left detailed msg per DPR.  

## 2021-09-26 NOTE — Telephone Encounter (Signed)
Pt had OV with you on 09/21/21 and came in for UA/Ucx on 09/22/21 due to experiencing urinary sxs. UA/Ucx returned negative for UTI. Pt notified and voiced understanding. States that she has not experienced any relief as of now with Rxs that were provided at your visit. However, pt states she is scheduled to see her PCP and will also address concerns with them. Advised pt to contact us for f/u prn. Pt voiced understanding.  ?

## 2021-09-26 NOTE — Telephone Encounter (Signed)
She was only provided vaginal estradiol cream at that visit. This will not help with any pelvic pressure or urinary symptoms. This was for her vulvar irritation that is likely from atrophic vaginitis. This can take up to a month to notice a difference with consistent use. I agree with discussing with PCP today. ?

## 2021-09-27 ENCOUNTER — Telehealth: Payer: Self-pay

## 2021-09-27 ENCOUNTER — Other Ambulatory Visit: Payer: 59

## 2021-09-27 ENCOUNTER — Other Ambulatory Visit: Payer: Self-pay | Admitting: Nurse Practitioner

## 2021-09-27 DIAGNOSIS — R7989 Other specified abnormal findings of blood chemistry: Secondary | ICD-10-CM

## 2021-09-27 LAB — COMPREHENSIVE METABOLIC PANEL
ALT: 19 IU/L (ref 0–32)
AST: 20 IU/L (ref 0–40)
Albumin/Globulin Ratio: 2.4 — ABNORMAL HIGH (ref 1.2–2.2)
Albumin: 4.7 g/dL (ref 3.8–4.8)
Alkaline Phosphatase: 97 IU/L (ref 44–121)
BUN/Creatinine Ratio: 15 (ref 12–28)
BUN: 11 mg/dL (ref 8–27)
Bilirubin Total: <0.2 mg/dL (ref 0.0–1.2)
CO2: 22 mmol/L (ref 20–29)
Calcium: 9.4 mg/dL (ref 8.7–10.3)
Chloride: 99 mmol/L (ref 96–106)
Creatinine, Ser: 0.72 mg/dL (ref 0.57–1.00)
Globulin, Total: 2 g/dL (ref 1.5–4.5)
Glucose: 81 mg/dL (ref 70–99)
Potassium: 4.4 mmol/L (ref 3.5–5.2)
Sodium: 142 mmol/L (ref 134–144)
Total Protein: 6.7 g/dL (ref 6.0–8.5)
eGFR: 94 mL/min/{1.73_m2} (ref >59)

## 2021-09-27 LAB — CBC WITH DIFFERENTIAL/PLATELET
Basophils Absolute: 0.1 10*3/uL (ref 0.0–0.2)
Basos: 1 %
EOS (ABSOLUTE): 0.1 10*3/uL (ref 0.0–0.4)
Eos: 1 %
Hematocrit: 41 % (ref 34.0–46.6)
Hemoglobin: 13.8 g/dL (ref 11.1–15.9)
Immature Grans (Abs): 0 10*3/uL (ref 0.0–0.1)
Immature Granulocytes: 0 %
Lymphocytes Absolute: 2 10*3/uL (ref 0.7–3.1)
Lymphs: 24 %
MCH: 32.9 pg (ref 26.6–33.0)
MCHC: 33.7 g/dL (ref 31.5–35.7)
MCV: 98 fL — ABNORMAL HIGH (ref 79–97)
Monocytes Absolute: 0.8 10*3/uL (ref 0.1–0.9)
Monocytes: 9 %
Neutrophils Absolute: 5.4 10*3/uL (ref 1.4–7.0)
Neutrophils: 65 %
Platelets: 339 10*3/uL (ref 150–450)
RBC: 4.2 x10E6/uL (ref 3.77–5.28)
RDW: 12.5 % (ref 11.7–15.4)
WBC: 8.3 10*3/uL (ref 3.4–10.8)

## 2021-09-27 LAB — LIPID PANEL
Chol/HDL Ratio: 2.5 ratio (ref 0.0–4.4)
Cholesterol, Total: 243 mg/dL — ABNORMAL HIGH (ref 100–199)
HDL: 96 mg/dL (ref >39)
LDL Chol Calc (NIH): 119 mg/dL — ABNORMAL HIGH (ref 0–99)
Triglycerides: 165 mg/dL — ABNORMAL HIGH (ref 0–149)
VLDL Cholesterol Cal: 28 mg/dL (ref 5–40)

## 2021-09-27 LAB — IGP, APTIMA HPV
HPV Aptima: NEGATIVE
PAP Smear Comment: 0

## 2021-09-27 LAB — HEMOGLOBIN A1C
Est. average glucose Bld gHb Est-mCnc: 108 mg/dL
Hgb A1c MFr Bld: 5.4 % (ref 4.8–5.6)

## 2021-09-27 LAB — TSH: TSH: 1.58 u[IU]/mL (ref 0.450–4.500)

## 2021-09-27 LAB — VITAMIN D 25 HYDROXY (VIT D DEFICIENCY, FRACTURES): Vit D, 25-Hydroxy: 110 ng/mL — ABNORMAL HIGH (ref 30.0–100.0)

## 2021-09-27 NOTE — Telephone Encounter (Signed)
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 This may be the cause of the pain she is still having. No other probable causes were identified on the MRI. I can refer her to orthopedic for further evaluation if she would like ?

## 2021-09-27 NOTE — Telephone Encounter (Signed)
LMOM to review results ?

## 2021-09-27 NOTE — Progress Notes (Signed)
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 This may be the cause of the pain she is still having. No other probable causes were identified on the MRI. I can refer her to orthopedic for further evaluation if she would like

## 2021-10-05 ENCOUNTER — Ambulatory Visit: Payer: 59 | Admitting: Nurse Practitioner

## 2021-10-11 NOTE — Telephone Encounter (Signed)
Error

## 2021-10-19 ENCOUNTER — Ambulatory Visit: Payer: 59

## 2021-10-23 ENCOUNTER — Other Ambulatory Visit: Payer: 59

## 2021-10-24 ENCOUNTER — Ambulatory Visit: Payer: 59

## 2021-10-25 ENCOUNTER — Other Ambulatory Visit: Payer: Self-pay | Admitting: Nurse Practitioner

## 2021-10-25 ENCOUNTER — Other Ambulatory Visit: Payer: 59

## 2021-10-25 ENCOUNTER — Ambulatory Visit (INDEPENDENT_AMBULATORY_CARE_PROVIDER_SITE_OTHER): Payer: 59

## 2021-10-25 DIAGNOSIS — Z13820 Encounter for screening for osteoporosis: Secondary | ICD-10-CM

## 2021-10-25 DIAGNOSIS — F410 Panic disorder [episodic paroxysmal anxiety] without agoraphobia: Secondary | ICD-10-CM

## 2021-10-25 DIAGNOSIS — E039 Hypothyroidism, unspecified: Secondary | ICD-10-CM

## 2021-10-25 DIAGNOSIS — K219 Gastro-esophageal reflux disease without esophagitis: Secondary | ICD-10-CM

## 2021-10-25 DIAGNOSIS — M150 Primary generalized (osteo)arthritis: Secondary | ICD-10-CM

## 2021-10-25 DIAGNOSIS — Z780 Asymptomatic menopausal state: Secondary | ICD-10-CM

## 2021-10-25 DIAGNOSIS — I10 Essential (primary) hypertension: Secondary | ICD-10-CM

## 2021-11-01 ENCOUNTER — Ambulatory Visit
Admission: RE | Admit: 2021-11-01 | Discharge: 2021-11-01 | Disposition: A | Discharge status: Home / Self Care | Payer: 59 | Source: Ambulatory Visit | Attending: Nurse Practitioner | Admitting: Nurse Practitioner

## 2021-11-01 ENCOUNTER — Ambulatory Visit: Payer: 59

## 2021-11-01 DIAGNOSIS — Z1231 Encounter for screening mammogram for malignant neoplasm of breast: Secondary | ICD-10-CM

## 2021-11-02 ENCOUNTER — Ambulatory Visit: Payer: 59 | Admitting: Nurse Practitioner

## 2021-11-02 ENCOUNTER — Other Ambulatory Visit: Payer: Self-pay | Admitting: Nurse Practitioner

## 2021-11-02 ENCOUNTER — Encounter: Payer: Self-pay | Admitting: Nurse Practitioner

## 2021-11-02 VITALS — BP 140/78 | HR 95 | Temp 98.2°F | Resp 16 | Ht 65.0 in | Wt 161.4 lb

## 2021-11-02 DIAGNOSIS — Z6827 Body mass index (BMI) 27.0-27.9, adult: Secondary | ICD-10-CM

## 2021-11-02 DIAGNOSIS — E663 Overweight: Secondary | ICD-10-CM | POA: Diagnosis not present

## 2021-11-02 DIAGNOSIS — F411 Generalized anxiety disorder: Secondary | ICD-10-CM

## 2021-11-02 DIAGNOSIS — F410 Panic disorder [episodic paroxysmal anxiety] without agoraphobia: Secondary | ICD-10-CM

## 2021-11-02 DIAGNOSIS — L570 Actinic keratosis: Secondary | ICD-10-CM

## 2021-11-02 DIAGNOSIS — J309 Allergic rhinitis, unspecified: Secondary | ICD-10-CM | POA: Diagnosis not present

## 2021-11-02 DIAGNOSIS — I10 Essential (primary) hypertension: Secondary | ICD-10-CM

## 2021-11-02 NOTE — Progress Notes (Incomplete)
Kathy Mccormick ?[address] ?[place], [place] [address] ? ?Internal MEDICINE  ?Office Visit Note ? ?Patient Name: Kathy Mccormick ? [id number]  ?[id number] ? ?Date of Service: 11/02/2021 ? ?Chief Complaint  ?Patient presents with  ?? Follow-up  ?  Spot on right leg, below knee on inside of calf, may need derm referral   ?? Gastroesophageal Reflux  ?? Hypertension  ?? Hyperlipidemia  ?? Anxiety  ?? Allergies  ?? Medication Refill  ?? Results  ?  MRI  ?? Quality Metric Gaps  ?  shingrix  ?? Weight Loss  ? ? ?HPI ?Kathy Mccormick presents for a follow-up visit for ? ? ? ? ? ?Current Medication: ?Outpatient Encounter Medications as of 11/02/2021  ?Medication Sig  ?? acyclovir (ZOVIRAX) 400 MG tablet TAKE 1 TABLET BY MOUTH  TWICE DAILY  ?? ALPRAZolam (XANAX) 0.5 MG tablet TAKE 1 TABLET BY MOUTH  TWICE DAILY AS NEEDED FOR  ANXIETY  ?? atorvastatin (LIPITOR) 10 MG tablet Take 1 tablet (10 mg total) by mouth at bedtime.  ?? Azelaic Acid 15 % gel GENTLY BUT THOROUGHLY MASSAGE A  THIN FILM TO AFFECTED AREA(S)  TOPICALLY IN THE MORNING AND  EVENING AFTER SKIN IS THOROUGHLY WASHED AND PATTED DRY  ?? CHERRY PO Take by mouth.  ?? desonide (DESOWEN) 0.05 % cream APPLY 1 APPLICATION  TOPICALLY 3 TIMES DAILY AS  NEEDED  ?? doxycycline (VIBRA-TABS) 100 MG tablet TAKE 1 TABLET BY MOUTH  DAILY  ?? DULoxetine (CYMBALTA) 60 MG capsule TAKE 1 CAPSULE BY MOUTH 1 TO 2  TIMES DAILY  ?? EMGALITY 120 MG/ML SOSY INJECT 120 MG INTO THE SKIN EVERY 30 (THIRTY) DAYS.  ?? estradiol (ESTRACE) 0.1 MG/GM vaginal cream Place 1 Applicatorful vaginally 2 (two) times a week.  ?? fluticasone (FLONASE) 50 MCG/ACT nasal spray Place 1 spray into both nostrils daily.  ?? levothyroxine (SYNTHROID) 50 MCG tablet TAKE 1 TABLET BY MOUTH DAILY  BEFORE BREAKFAST  ?? losartan (COZAAR) 100 MG tablet TAKE 1 TABLET BY MOUTH DAILY  ?? meloxicam (MOBIC) 15 MG tablet TAKE 1 TABLET BY MOUTH DAILY AS  NEEDED  ?? Multiple Vitamin (MULTIVITAMIN) tablet Take 1 tablet by mouth daily.  ??  omeprazole (PRILOSEC) 40 MG capsule TAKE 1 CAPSULE BY MOUTH DAILY  ?? zolmitriptan (ZOMIG-ZMT) 5 MG disintegrating tablet TAKE 1 TABLET BY MOUTH AS NEEDED FOR MIGRAINES. MAY REPEAT AFTER 2-3 HRS. MAX DOSE 2 TABS IN 24 HRS  ?? zolpidem (AMBIEN CR) 12.5 MG CR tablet TAKE 1 TABLET BY MOUTH AT  BEDTIME AS NEEDED FOR SLEEP  ?? [DISCONTINUED] busPIRone (BUSPAR) 5 MG tablet Take 1-2 tablets (5-10 mg total) by mouth 2 (two) times daily.  ?? [DISCONTINUED] cycloSPORINE (RESTASIS MULTIDOSE) 0.05 % ophthalmic emulsion Place 1 drop into both eyes 2 (two) times daily. (Patient not taking: Reported on 09/21/2021)  ?? [DISCONTINUED] fluconazole (DIFLUCAN) 150 MG tablet Take 1 tab po once and may repeat in 3 days if symptoms persists (Patient not taking: Reported on 09/21/2021)  ?? [DISCONTINUED] RESTASIS 0.05 % ophthalmic emulsion 1 drop 2 (two) times daily. (Patient not taking: Reported on 09/21/2021)  ? ?No facility-administered encounter medications on file as of 11/02/2021.  ? ? ?Surgical History: ?Past Surgical History:  ?Procedure Laterality Date  ?? BACK SURGERY    ?? CARPAL TUNNEL RELEASE    ?? CARPAL TUNNEL RELEASE Bilateral 2005  ?? CHOLECYSTECTOMY    ?? COLPOSCOPY    ?? OOPHORECTOMY    ? RSO,LSO  ?? PELVIC LAPAROSCOPY  2001,2004  ?  DL X 2 RSO and LSO  ?? TONSILLECTOMY    ?? TRIGGER FINGER RELEASE Right 08/24/2021  ?? VAGINAL HYSTERECTOMY  2000  ? endometriosis  ? ? ?Medical History: ?Past Medical History:  ?Diagnosis Date  ?? Allergy   ? environmental  ?? Anxiety   ?? CIN I (cervical intraepithelial neoplasia I)   ?? Endometriosis   ?? GERD (gastroesophageal reflux disease)   ?? Hyperlipidemia   ?? Hypertension   ?? Migraine   ?? Migraines   ?? Neck pain   ?? STD (sexually transmitted disease)   ? HSV ll  ? ? ?Family History: ?Family History  ?Problem Relation Age of Onset  ?? Hypertension Father   ?? Diabetes Mother   ?? Hypertension Mother   ?? Uterine cancer Mother   ?? Liver disease Mother   ?? Hypertension Brother   ??  Heart Problems Brother   ?? Breast cancer Maternal Aunt   ?     Age 33's  ?? Aneurysm Maternal Aunt   ? ? ?Social History  ? ?Socioeconomic History  ?? Marital status: Married  ?  Spouse name: Not on file  ?? Number of children: Not on file  ?? Years of education: Not on file  ?? Highest education level: Not on file  ?Occupational History  ?? Not on file  ?Tobacco Use  ?? Smoking status: Former  ?? Smokeless tobacco: Never  ?Vaping Use  ?? Vaping Use: Never used  ?Substance and Sexual Activity  ?? Alcohol use: No  ?  Alcohol/week: 0.0 standard drinks  ?? Drug use: No  ?? Sexual activity: Not Currently  ?  Birth control/protection: Surgical  ?  Comment: 1st intercourse 66 yo-More than 5 partners  ?Other Topics Concern  ?? Not on file  ?Social History Narrative  ?? Not on file  ? ?Social Determinants of Health  ? ?Financial Resource Strain: Not on file  ?Food Insecurity: Not on file  ?Transportation Needs: Not on file  ?Physical Activity: Not on file  ?Stress: Not on file  ?Social Connections: Not on file  ?Intimate Partner Violence: Not on file  ? ? ? ? ?Review of Systems ? ?Vital Signs: ?BP 140/78   Pulse 95   Temp 98.2 ?F (36.8 ?C)   Resp 16   Ht '5\' 5"'$  (1.651 m)   Wt 161 lb 6.4 oz (73.2 kg)   SpO2 99%   BMI 26.86 kg/m?  ? ? ?Physical Exam ? ? ? ? ?Assessment/Plan: ? ? ?General Counseling: Kathy Mccormick verbalizes understanding of the findings of todays visit and agrees with plan of treatment. I have discussed any further diagnostic evaluation that may be needed or ordered today. We also reviewed her medications today. she has been encouraged to call the office with any questions or concerns that should arise related to todays visit. ? ? ? ?No orders of the defined types were placed in this encounter. ? ? ?No orders of the defined types were placed in this encounter. ? ? ?No follow-ups on file. ? ? ?Total time spent:*** Minutes ?Time spent includes review of chart, medications, test results, and follow up plan with  the patient.  ? ?[place] Controlled Substance Database was reviewed by me. ? ?This patient was seen by Jonetta Osgood, FNP-C in collaboration with Dr. Clayborn Bigness as a part of collaborative care agreement. ? ? ?Glorie Dowlen R. Valetta Fuller, MSN, FNP-C ?Internal medicine  ?

## 2021-11-03 ENCOUNTER — Other Ambulatory Visit: Payer: Self-pay | Admitting: Nurse Practitioner

## 2021-11-03 DIAGNOSIS — R928 Other abnormal and inconclusive findings on diagnostic imaging of breast: Secondary | ICD-10-CM

## 2021-11-04 ENCOUNTER — Other Ambulatory Visit: Payer: Self-pay | Admitting: Nurse Practitioner

## 2021-11-04 ENCOUNTER — Ambulatory Visit
Admission: RE | Admit: 2021-11-04 | Discharge: 2021-11-04 | Disposition: A | Discharge status: Home / Self Care | Payer: 59 | Source: Ambulatory Visit | Attending: Nurse Practitioner | Admitting: Nurse Practitioner

## 2021-11-04 DIAGNOSIS — R921 Mammographic calcification found on diagnostic imaging of breast: Secondary | ICD-10-CM

## 2021-11-04 DIAGNOSIS — R928 Other abnormal and inconclusive findings on diagnostic imaging of breast: Secondary | ICD-10-CM

## 2021-11-06 ENCOUNTER — Other Ambulatory Visit: Payer: Self-pay

## 2021-11-06 MED ORDER — MONTELUKAST SODIUM 10 MG PO TABS: 10.0000 mg | ORAL_TABLET | Freq: Every day | ORAL | 1 refills | Status: DC

## 2021-11-15 ENCOUNTER — Ambulatory Visit
Admission: RE | Admit: 2021-11-15 | Discharge: 2021-11-15 | Disposition: A | Discharge status: Home / Self Care | Payer: 59 | Source: Ambulatory Visit | Attending: Nurse Practitioner | Admitting: Nurse Practitioner

## 2021-11-15 DIAGNOSIS — R921 Mammographic calcification found on diagnostic imaging of breast: Secondary | ICD-10-CM

## 2021-11-15 HISTORY — PX: BREAST BIOPSY: SHX20

## 2021-11-26 ENCOUNTER — Other Ambulatory Visit: Payer: Self-pay | Admitting: Nurse Practitioner

## 2021-11-26 DIAGNOSIS — F410 Panic disorder [episodic paroxysmal anxiety] without agoraphobia: Secondary | ICD-10-CM

## 2021-11-28 ENCOUNTER — Other Ambulatory Visit: Payer: Self-pay | Admitting: Nurse Practitioner

## 2021-12-07 ENCOUNTER — Ambulatory Visit: Payer: 59 | Admitting: Nurse Practitioner

## 2021-12-17 ENCOUNTER — Other Ambulatory Visit: Payer: Self-pay | Admitting: Nurse Practitioner

## 2021-12-17 DIAGNOSIS — L719 Rosacea, unspecified: Secondary | ICD-10-CM

## 2021-12-17 DIAGNOSIS — B009 Herpesviral infection, unspecified: Secondary | ICD-10-CM

## 2021-12-17 DIAGNOSIS — F5101 Primary insomnia: Secondary | ICD-10-CM

## 2021-12-18 NOTE — Telephone Encounter (Signed)
Has appt in 2 days, Kathy Mccormick is fill by [facility], I will send prescription today, but must see patient on 6/21 at her appt.

## 2021-12-19 ENCOUNTER — Encounter: Payer: Self-pay | Admitting: Dermatology

## 2021-12-19 ENCOUNTER — Ambulatory Visit: Payer: 59 | Admitting: Dermatology

## 2021-12-19 DIAGNOSIS — L814 Other melanin hyperpigmentation: Secondary | ICD-10-CM

## 2021-12-19 DIAGNOSIS — L821 Other seborrheic keratosis: Secondary | ICD-10-CM

## 2021-12-19 DIAGNOSIS — L570 Actinic keratosis: Secondary | ICD-10-CM

## 2021-12-19 DIAGNOSIS — D485 Neoplasm of uncertain behavior of skin: Secondary | ICD-10-CM | POA: Diagnosis not present

## 2021-12-19 DIAGNOSIS — D492 Neoplasm of unspecified behavior of bone, soft tissue, and skin: Secondary | ICD-10-CM

## 2021-12-19 MED ORDER — MUPIROCIN 2 % EX OINT: TOPICAL_OINTMENT | CUTANEOUS | 1 refills | Status: DC

## 2021-12-19 NOTE — Progress Notes (Signed)
Follow-Up Visit   Subjective  Kathy Mccormick is a 66 y.o. female who presents for the following: lesion (Right lower leg. Dur: couple of months. Growing. Non tender. Itches at times. Other lesions on chest, lip and face. Pink, raised).  The patient has spots, moles and lesions to be evaluated, some may be new or changing and the patient has concerns that these could be cancer.  The following portions of the chart were reviewed this encounter and updated as appropriate:  Tobacco  Allergies  Meds  Problems  Med Hx  Surg Hx  Fam Hx      Review of Systems: No other skin or systemic complaints except as noted in HPI or Assessment and Plan.   Objective  Well appearing patient in no apparent distress; mood and affect are within normal limits.  A focused examination was performed including face, chest, and lower legs. Relevant physical exam findings are noted in the Assessment and Plan.  right medial pretibia 0.8 cm firm pink papule       Left Zygoma 0.5 cm scaly pink papule     Left Upper Cutaneous Lip 0.25 cm pink papule with pigment globules      left chest Slightly thicker scaly pink papule      Assessment & Plan  Neoplasm of skin (3) right medial pretibia  Epidermal / dermal shaving  Lesion diameter (cm):  0.8 Informed consent: discussed and consent obtained   Patient was prepped and draped in usual sterile fashion: Area prepped with alcohol. Anesthesia: the lesion was anesthetized in a standard fashion   Anesthetic:  1% lidocaine w/ epinephrine 1-100,000 buffered w/ 8.4% NaHCO3 Instrument used: flexible razor blade   Hemostasis achieved with: pressure, aluminum chloride and electrodesiccation   Outcome: patient tolerated procedure well   Post-procedure details: wound care instructions given   Post-procedure details comment:  Ointment and small bandage applied  Destruction of lesion  Destruction method: electrodesiccation and curettage    Informed consent: discussed and consent obtained   Timeout:  patient name, date of birth, surgical site, and procedure verified Anesthesia: the lesion was anesthetized in a standard fashion   Anesthetic:  1% lidocaine w/ epinephrine 1-100,000 buffered w/ 8.4% NaHCO3 Curettage performed in three different directions: Yes   Electrodesiccation performed over the curetted area: Yes   Curettage cycles:  3 Lesion length (cm):  0.8 Lesion width (cm):  0.8 Margin per side (cm):  0.2 Final wound size (cm):  1.2 Hemostasis achieved with:  electrodesiccation Outcome: patient tolerated procedure well with no complications   Post-procedure details: sterile dressing applied and wound care instructions given   Dressing type: petrolatum    mupirocin ointment (BACTROBAN) 2 % Apply to wound once daily with bandage change  Left Zygoma  Skin / nail biopsy Type of biopsy: tangential   Informed consent: discussed and consent obtained   Timeout: patient name, date of birth, surgical site, and procedure verified   Procedure prep:  Patient was prepped and draped in usual sterile fashion Prep type:  Isopropyl alcohol Anesthesia: the lesion was anesthetized in a standard fashion   Anesthetic:  1% lidocaine w/ epinephrine 1-100,000 buffered w/ 8.4% NaHCO3 Instrument used: flexible razor blade   Hemostasis achieved with: pressure, aluminum chloride and electrodesiccation   Outcome: patient tolerated procedure well   Post-procedure details: sterile dressing applied and wound care instructions given   Dressing type: bandage and petrolatum    Left Upper Cutaneous Lip  Skin / nail biopsy Type of  biopsy: tangential   Informed consent: discussed and consent obtained   Timeout: patient name, date of birth, surgical site, and procedure verified   Procedure prep:  Patient was prepped and draped in usual sterile fashion Prep type:  Isopropyl alcohol Anesthesia: the lesion was anesthetized in a standard fashion    Anesthetic:  1% lidocaine w/ epinephrine 1-100,000 buffered w/ 8.4% NaHCO3 Instrument used: flexible razor blade   Hemostasis achieved with: pressure, aluminum chloride and electrodesiccation   Outcome: patient tolerated procedure well   Post-procedure details: sterile dressing applied and wound care instructions given   Dressing type: bandage and petrolatum    Mupirocin apply once daily with bandage change.  Specimens sent to [facility].  Related Procedures Anatomic Pathology Report  Hypertrophic actinic keratosis left chest  Actinic keratoses are precancerous spots that appear secondary to cumulative UV radiation exposure/sun exposure over time. They are chronic with expected duration over 1 year. A portion of actinic keratoses will progress to squamous cell carcinoma of the skin. It is not possible to reliably predict which spots will progress to skin cancer and so treatment is recommended to prevent development of skin cancer.  Bx at next visit if not resolved  Destruction of lesion - left chest  Destruction method: cryotherapy   Informed consent: discussed and consent obtained   Lesion destroyed using liquid nitrogen: Yes   Outcome: patient tolerated procedure well with no complications   Post-procedure details: wound care instructions given   Additional details:  Prior to procedure, discussed risks of blister formation, small wound, skin dyspigmentation, or rare scar following cryotherapy. Recommend Vaseline ointment to treated areas while healing.    Seborrheic Keratoses - Stuck-on, waxy, tan-brown papules and/or plaques  - Benign-appearing - Discussed benign etiology and prognosis. - Observe - Call for any changes  Lentigines - Scattered tan macules - Due to sun exposure - Benign-appering, observe - Recommend daily broad spectrum sunscreen SPF 30+ to sun-exposed areas, reapply every 2 hours as needed. - Call for any changes  Return for TBSE, Biopsy Follow Up in  1-2 months.  I, Emelia Salisbury, CMA, am acting as scribe for Forest Gleason, MD.  Documentation: I have reviewed the above documentation for accuracy and completeness, and I agree with the above.  Forest Gleason, MD

## 2021-12-19 NOTE — Patient Instructions (Addendum)
Electrodesiccation and Curettage ("Scrape and Burn") Wound Care Instructions  Leave the original bandage on for 24 hours if possible.  If the bandage becomes soaked or soiled before that time, it is OK to remove it and examine the wound.  A small amount of post-operative bleeding is normal.  If excessive bleeding occurs, remove the bandage, place gauze over the site and apply continuous pressure (no peeking) over the area for 30 minutes. If this does not work, please call our clinic as soon as possible or page your doctor if it is after hours.   Once a day, cleanse the wound with soap and water. It is fine to shower. If a thick crust develops you may use a Q-tip dipped into dilute hydrogen peroxide (mix 1:1 with water) to dissolve it.  Hydrogen peroxide can slow the healing process, so use it only as needed.    After washing, apply petroleum jelly (Vaseline) or an antibiotic ointment if your doctor prescribed one for you, followed by a bandage.    For best healing, the wound should be covered with a layer of ointment at all times. If you are not able to keep the area covered with a bandage to hold the ointment in place, this may mean re-applying the ointment several times a day.  Continue this wound care until the wound has healed and is no longer open. It may take several weeks for the wound to heal and close.  Itching and mild discomfort is normal during the healing process.  If you have any discomfort, you can take Tylenol (acetaminophen) or ibuprofen as directed on the bottle. (Please do not take these if you have an allergy to them or cannot take them for another reason).  Some redness, tenderness and white or yellow material in the wound is normal healing.  If the area becomes very sore and red, or develops a thick yellow-green material (pus), it may be infected; please notify us.    Wound healing continues for up to one year following surgery. It is not unusual to experience pain in the scar  from time to time during the interval.  If the pain becomes severe or the scar thickens, you should notify the office.    A slight amount of redness in a scar is expected for the first six months.  After six months, the redness will fade and the scar will soften and fade.  The color difference becomes less noticeable with time.  If there are any problems, return for a post-op surgery check at your earliest convenience.  To improve the appearance of the scar, you can use silicone scar gel, cream, or sheets (such as Mederma or Serica) every night for up to one year. These are available over the counter (without a prescription).  Please call our office at [phone] for any questions or concerns.  Mupirocin apply once daily with bandage change.   Cryotherapy Aftercare  Wash gently with soap and water everyday.   Apply Vaseline daily until healed.   Prior to procedure, discussed risks of blister formation, small wound, skin dyspigmentation, or rare scar following cryotherapy. Recommend Vaseline ointment to treated areas while healing.    Due to recent changes in healthcare laws, you may see results of your pathology and/or laboratory studies on MyChart before the doctors have had a chance to review them. We understand that in some cases there may be results that are confusing or concerning to you. Please understand that not all results are received  at the same time and often the doctors may need to interpret multiple results in order to provide you with the best plan of care or course of treatment. Therefore, we ask that you please give [place] 2 business days to thoroughly review all your results before contacting the office for clarification. Should we see a critical lab result, you will be contacted sooner.   If You Need Anything After Your Visit  If you have any questions or concerns for your doctor, please call our main line at [phone] and press option 4 to reach your doctor's medical  assistant. If no one answers, please leave a voicemail as directed and we will return your call as soon as possible. Messages left after 4 pm will be answered the following business day.   You may also send [place] a message via [place]. We typically respond to MyChart messages within 1-2 business days.  For prescription refills, please ask your pharmacy to contact our office. Our fax number is [phone].  If you have an urgent issue when the clinic is closed that cannot wait until the next business day, you can page your doctor at the number below.    Please note that while we do our best to be available for urgent issues outside of office hours, we are not available 24/7.   If you have an urgent issue and are unable to reach [place], you may choose to seek medical care at your doctor's office, retail clinic, urgent care center, or emergency room.  If you have a medical emergency, please immediately call 911 or go to the emergency department.  Pager Numbers  - Dr. Nehemiah Massed: [phone]  - Dr. Laurence Ferrari: [phone]  - Dr. Nicole Kindred: [phone]  In the event of inclement weather, please call our main line at [phone] for an update on the status of any delays or closures.  Dermatology Medication Tips: Please keep the boxes that topical medications come in in order to help keep track of the instructions about where and how to use these. Pharmacies typically print the medication instructions only on the boxes and not directly on the medication tubes.   If your medication is too expensive, please contact our office at [phone] option 4 or send [place] a message through [place].   We are unable to tell what your co-pay for medications will be in advance as this is different depending on your insurance coverage. However, we may be able to find a substitute medication at lower cost or fill out paperwork to get insurance to cover a needed medication.   If a prior authorization is required to get your  medication covered by your insurance company, please allow [place] 1-2 business days to complete this process.  Drug prices often vary depending on where the prescription is filled and some pharmacies may offer cheaper prices.  The website www.goodrx.com contains coupons for medications through different pharmacies. The prices here do not account for what the cost may be with help from insurance (it may be cheaper with your insurance), but the website can give you the price if you did not use any insurance.  - You can print the associated coupon and take it with your prescription to the pharmacy.  - You may also stop by our office during regular business hours and pick up a GoodRx coupon card.  - If you need your prescription sent electronically to a different pharmacy, notify our office through [facility] or by phone at [phone] option 4.  Si Usted Necesita Algo Despus de Su Visita  Tambin puede enviarnos un mensaje a travs de Pharmacist, community. Por lo general respondemos a los mensajes de MyChart en el transcurso de 1 a 2 das hbiles.  Para renovar recetas, por favor pida a su farmacia que se ponga en contacto con nuestra oficina. Harland Dingwall de fax es [place] [phone].  Si tiene un asunto urgente cuando la clnica est cerrada y que no puede esperar hasta el siguiente da hbil, puede llamar/localizar a su doctor(a) al nmero que aparece a continuacin.   Por favor, tenga en cuenta que aunque hacemos todo lo posible para estar disponibles para asuntos urgentes fuera del horario de [place], no estamos disponibles las 24 horas del da, los 7 das de la [place].   Si tiene un problema urgente y no puede comunicarse con nosotros, puede optar por buscar atencin mdica  en el consultorio de su doctor(a), en una clnica privada, en un centro de atencin urgente o en una sala de emergencias.  Si tiene Engineering geologist, por favor llame inmediatamente al 911 o vaya a la sala de  emergencias.  Nmeros de bper  - Dr. Nehemiah Massed: [phone]  - Dra. Moye: [phone]  - Dra. Nicole Kindred: [phone]  En caso de inclemencias del [place], por favor llame a Johnsie Kindred principal al [phone] para una actualizacin sobre el [place] de cualquier retraso o cierre.  Consejos para la medicacin en dermatologa: Por favor, guarde las cajas en las que vienen los medicamentos de uso tpico para ayudarle a seguir las instrucciones sobre dnde y cmo usarlos. Las farmacias generalmente imprimen las instrucciones del medicamento slo en las cajas y no directamente en los tubos del [place].   Si su medicamento es muy caro, por favor, pngase en contacto con Zigmund Daniel llamando al [phone] y presione la opcin 4 o envenos un mensaje a travs de Pharmacist, community.   No podemos decirle cul ser su copago por los medicamentos por adelantado ya que esto es diferente dependiendo de la cobertura de su seguro. Sin embargo, es posible que podamos encontrar un medicamento sustituto a Electrical engineer un formulario para que el seguro cubra el medicamento que se considera necesario.   Si se requiere una autorizacin previa para que su compaa de seguros [place] su medicamento, por favor permtanos de 1 a 2 das hbiles para completar este proceso.  Los precios de los medicamentos varan con frecuencia dependiendo del Environmental consultant de dnde se surte la receta y alguna farmacias pueden ofrecer precios ms baratos.  El sitio web www.goodrx.com tiene cupones para medicamentos de Airline pilot. Los precios aqu no tienen en cuenta lo que podra costar con la ayuda del seguro (puede ser ms barato con su seguro), pero el sitio web puede darle el precio si no utiliz Research scientist (physical sciences).  - Puede imprimir el cupn correspondiente y llevarlo con su receta a la farmacia.  - Tambin puede pasar por nuestra oficina durante el horario de atencin regular y Charity fundraiser una tarjeta de cupones de GoodRx.  - Si  necesita que su receta se enve electrnicamente a una farmacia diferente, informe a nuestra oficina a travs de MyChart de [place] o por telfono llamando al [phone] y presione la opcin 4.

## 2021-12-20 ENCOUNTER — Encounter: Payer: Self-pay | Admitting: Nurse Practitioner

## 2021-12-20 ENCOUNTER — Ambulatory Visit: Payer: 59 | Admitting: Nurse Practitioner

## 2021-12-20 VITALS — BP 130/75 | HR 90 | Temp 98.6°F | Resp 16 | Ht 65.0 in | Wt 165.6 lb

## 2021-12-20 DIAGNOSIS — J3089 Other allergic rhinitis: Secondary | ICD-10-CM

## 2021-12-20 DIAGNOSIS — G43009 Migraine without aura, not intractable, without status migrainosus: Secondary | ICD-10-CM

## 2021-12-20 DIAGNOSIS — E782 Mixed hyperlipidemia: Secondary | ICD-10-CM

## 2021-12-20 DIAGNOSIS — F411 Generalized anxiety disorder: Secondary | ICD-10-CM

## 2021-12-20 DIAGNOSIS — F410 Panic disorder [episodic paroxysmal anxiety] without agoraphobia: Secondary | ICD-10-CM

## 2021-12-20 DIAGNOSIS — L719 Rosacea, unspecified: Secondary | ICD-10-CM | POA: Diagnosis not present

## 2021-12-20 DIAGNOSIS — Z6827 Body mass index (BMI) 27.0-27.9, adult: Secondary | ICD-10-CM

## 2021-12-20 MED ORDER — SEMAGLUTIDE-WEIGHT MANAGEMENT 0.25 MG/0.5ML ~~LOC~~ SOAJ
0.2500 mg | SUBCUTANEOUS | 1 refills | Status: DC
Start: 2021-12-20 — End: 2021-12-24

## 2021-12-20 MED ORDER — ALPRAZOLAM 0.5 MG PO TABS: 0.5000 mg | ORAL_TABLET | Freq: Two times a day (BID) | ORAL | 0 refills | Status: DC | PRN

## 2021-12-20 MED ORDER — EMGALITY 120 MG/ML ~~LOC~~ SOSY: 120.0000 mg | PREFILLED_SYRINGE | SUBCUTANEOUS | 5 refills | Status: DC

## 2021-12-20 MED ORDER — ROSUVASTATIN CALCIUM 10 MG PO TABS: 10.0000 mg | ORAL_TABLET | Freq: Every day | ORAL | 2 refills | Status: DC

## 2021-12-20 MED ORDER — DESONIDE 0.05 % EX CREA: TOPICAL_CREAM | CUTANEOUS | 2 refills | Status: DC

## 2021-12-20 MED ORDER — MONTELUKAST SODIUM 10 MG PO TABS: 10.0000 mg | ORAL_TABLET | Freq: Every day | ORAL | 1 refills | Status: DC

## 2021-12-20 MED ORDER — ATORVASTATIN CALCIUM 10 MG PO TABS: 10.0000 mg | ORAL_TABLET | Freq: Every day | ORAL | 1 refills | Status: DC

## 2021-12-20 NOTE — Progress Notes (Signed)
[facility] [place], [place] [address]  Internal MEDICINE  Office Visit Note  Patient Name: Kathy Mccormick  [id number]  [id number]  Date of Service: 12/20/2021  Chief Complaint  Patient presents with   Follow-up    Discuss meds   Gastroesophageal Reflux   Hyperlipidemia   Hypertension   Anxiety   Medication Refill    HPI Kathy Mccormick presents for follow-up visit for hypertension, weight loss management and medication refills. --She was given a sample of Wegovy to try and she did not lose any significant weight.  Her BMI is only slightly above 27.  Patient is interested in continuing the [facility] but it may not be covered by her insurance and her BMI may not be elevated enough to qualify for her insurance to cover the medic --She is also due for other medication refills today. --Her migraines, rosacea and allergic rhinitis are controlled with current medications.   Current Medication: Outpatient Encounter Medications as of 12/20/2021  Medication Sig   acyclovir (ZOVIRAX) 400 MG tablet TAKE 1 TABLET BY MOUTH TWICE  DAILY   Azelaic Acid 15 % gel GENTLY BUT THOROUGHLY MASSAGE A  THIN FILM TO AFFECTED AREA(S)  TOPICALLY IN THE MORNING AND  EVENING AFTER SKIN IS THOROUGHLY WASHED AND PATTED DRY   busPIRone (BUSPAR) 5 MG tablet TAKE 1-2 TABLETS (5-10 MG TOTAL) BY MOUTH 2 (TWO) TIMES DAILY.   CHERRY PO Take by mouth.   doxycycline (VIBRA-TABS) 100 MG tablet TAKE 1 TABLET BY MOUTH DAILY   DULoxetine (CYMBALTA) 60 MG capsule TAKE 1 CAPSULE BY MOUTH 1 TO 2  TIMES DAILY   estradiol (ESTRACE) 0.1 MG/GM vaginal cream Place 1 Applicatorful vaginally 2 (two) times a week.   fluticasone (FLONASE) 50 MCG/ACT nasal spray Place 1 spray into both nostrils daily.   levothyroxine (SYNTHROID) 50 MCG tablet TAKE 1 TABLET BY MOUTH DAILY  BEFORE BREAKFAST   losartan (COZAAR) 100 MG tablet TAKE 1 TABLET BY MOUTH DAILY   meloxicam (MOBIC) 15 MG tablet TAKE 1 TABLET BY MOUTH DAILY AS  NEEDED    Multiple Vitamin (MULTIVITAMIN) tablet Take 1 tablet by mouth daily.   mupirocin ointment (BACTROBAN) 2 % Apply to wound once daily with bandage change   omeprazole (PRILOSEC) 40 MG capsule TAKE 1 CAPSULE BY MOUTH DAILY   rosuvastatin (CRESTOR) 10 MG tablet Take 1 tablet (10 mg total) by mouth daily.   zolmitriptan (ZOMIG-ZMT) 5 MG disintegrating tablet TAKE 1 TABLET BY MOUTH AS NEEDED FOR MIGRAINES. MAY REPEAT AFTER 2-3 HRS. MAX DOSE 2 TABS IN 24 HRS   zolpidem (AMBIEN CR) 12.5 MG CR tablet TAKE 1 TABLET BY MOUTH AT  BEDTIME AS NEEDED FOR SLEEP   [DISCONTINUED] ALPRAZolam (XANAX) 0.5 MG tablet TAKE 1 TABLET BY MOUTH  TWICE DAILY AS NEEDED FOR  ANXIETY   [DISCONTINUED] atorvastatin (LIPITOR) 10 MG tablet Take 1 tablet (10 mg total) by mouth at bedtime.   [DISCONTINUED] desonide (DESOWEN) 0.05 % cream APPLY 1 APPLICATION  TOPICALLY 3 TIMES DAILY AS  NEEDED   [DISCONTINUED] EMGALITY 120 MG/ML SOSY INJECT 120 MG INTO THE SKIN EVERY 30 (THIRTY) DAYS.   [DISCONTINUED] montelukast (SINGULAIR) 10 MG tablet TAKE 1 TABLET BY MOUTH EVERYDAY AT BEDTIME   [DISCONTINUED] Semaglutide-Weight Management 0.25 MG/0.5ML SOAJ Inject 0.25 mg into the skin once a week.   ALPRAZolam (XANAX) 0.5 MG tablet Take 1 tablet (0.5 mg total) by mouth 2 (two) times daily as needed for anxiety. for anxiety   atorvastatin (LIPITOR)  10 MG tablet Take 1 tablet (10 mg total) by mouth at bedtime.   desonide (DESOWEN) 0.05 % cream APPLY 1 APPLICATION  TOPICALLY 3 TIMES DAILY AS  NEEDED   Galcanezumab-gnlm (EMGALITY) 120 MG/ML SOSY Inject 120 mg into the skin every 30 (thirty) days.   montelukast (SINGULAIR) 10 MG tablet Take 1 tablet (10 mg total) by mouth at bedtime.   No facility-administered encounter medications on file as of 12/20/2021.    Surgical History: Past Surgical History:  Procedure Laterality Date   BACK SURGERY     CARPAL TUNNEL RELEASE     CARPAL TUNNEL RELEASE Bilateral 2005   CHOLECYSTECTOMY     COLPOSCOPY      OOPHORECTOMY     RSO,LSO   PELVIC LAPAROSCOPY  2001,2004   DL X 2 RSO and LSO   TONSILLECTOMY     TRIGGER FINGER RELEASE Right 08/24/2021   VAGINAL HYSTERECTOMY  2000   endometriosis    Medical History: Past Medical History:  Diagnosis Date   Allergy    environmental   Anxiety    CIN I (cervical intraepithelial neoplasia I)    Endometriosis    GERD (gastroesophageal reflux disease)    Hyperlipidemia    Hypertension    Migraine    Migraines    Neck pain    STD (sexually transmitted disease)    HSV ll    Family History: Family History  Problem Relation Age of Onset   Hypertension Father    Diabetes Mother    Hypertension Mother    Uterine cancer Mother    Liver disease Mother    Hypertension Brother    Heart Problems Brother    Breast cancer Maternal Aunt        Age 8's   Aneurysm Maternal Aunt     Social History   Socioeconomic History   Marital status: Married    Spouse name: Not on file   Number of children: Not on file   Years of education: Not on file   Highest education level: Not on file  Occupational History   Not on file  Tobacco Use   Smoking status: Former   Smokeless tobacco: Never  Vaping Use   Vaping Use: Never used  Substance and Sexual Activity   Alcohol use: No    Alcohol/week: 0.0 standard drinks of alcohol   Drug use: No   Sexual activity: Not Currently    Birth control/protection: Surgical    Comment: 1st intercourse 66 yo-More than 5 partners  Other Topics Concern   Not on file  Social History Narrative   Not on file   Social Determinants of Health   Financial Resource Strain: Not on file  Food Insecurity: Not on file  Transportation Needs: Not on file  Physical Activity: Not on file  Stress: Not on file  Social Connections: Not on file  Intimate Partner Violence: Not on file      Review of Systems  Constitutional:  Negative for chills, fatigue and unexpected weight change.  HENT:  Negative for congestion,  rhinorrhea, sneezing and sore throat.   Eyes:  Negative for redness.  Respiratory: Negative.  Negative for cough, chest tightness, shortness of breath and wheezing.   Cardiovascular: Negative.  Negative for chest pain and palpitations.  Gastrointestinal:  Negative for abdominal pain, constipation, diarrhea, nausea and vomiting.  Genitourinary:  Negative for dysuria and frequency.  Musculoskeletal:  Positive for arthralgias. Negative for joint swelling and neck pain.  Skin:  Negative for  rash.  Neurological: Negative.  Negative for tremors and numbness.  Hematological:  Negative for adenopathy. Does not bruise/bleed easily.  Psychiatric/Behavioral:  Negative for behavioral problems (Depression), sleep disturbance and suicidal ideas. The patient is not nervous/anxious.     Vital Signs: BP 130/75 Comment: 144/92  Pulse 90   Temp 98.6 F (37 C)   Resp 16   Ht '5\' 5"'$  (1.651 m)   Wt 165 lb 9.6 oz (75.1 kg)   SpO2 99%   BMI 27.56 kg/m    Physical Exam Vitals reviewed.  Constitutional:      General: She is not in acute distress.    Appearance: Normal appearance. She is normal weight. She is not ill-appearing.  HENT:     Head: Normocephalic and atraumatic.  Eyes:     Pupils: Pupils are equal, round, and reactive to light.  Cardiovascular:     Rate and Rhythm: Normal rate and regular rhythm.  Pulmonary:     Effort: Pulmonary effort is normal. No respiratory distress.  Neurological:     Mental Status: She is alert and oriented to person, place, and time.     Cranial Nerves: No cranial nerve deficit.     Gait: Gait normal.  Psychiatric:        Mood and Affect: Mood normal.        Behavior: Behavior normal.        Assessment/Plan: 1. Migraine without aura and without status migrainosus, not intractable Emgality refills ordered, number of migraines per month is improved - Galcanezumab-gnlm (EMGALITY) 120 MG/ML SOSY; Inject 120 mg into the skin every 30 (thirty) days.   Dispense: 1 mL; Refill: 5  2. Mixed hyperlipidemia Continue rosuvastatin, refills ordered. - rosuvastatin (CRESTOR) 10 MG tablet; Take 1 tablet (10 mg total) by mouth daily.  Dispense: 30 tablet; Refill: 2  3. Rosacea Refills ordered. - desonide (DESOWEN) 0.05 % cream; APPLY 1 APPLICATION  TOPICALLY 3 TIMES DAILY AS  NEEDED  Dispense: 60 g; Refill: 2  4. Non-seasonal allergic rhinitis due to other allergic trigger Refills ordered. - montelukast (SINGULAIR) 10 MG tablet; Take 1 tablet (10 mg total) by mouth at bedtime.  Dispense: 90 tablet; Refill: 1  5. BMI 27.0-27.9,adult Prescription for [facility] sent to her pharmacy, will most likely need a prior authorization which will be completed and we can see if her insurance will cover the medication.  6. Generalized anxiety disorder with panic attacks Anxiety is well controlled, alprazolam refills ordered - ALPRAZolam (XANAX) 0.5 MG tablet; Take 1 tablet (0.5 mg total) by mouth 2 (two) times daily as needed for anxiety. for anxiety  Dispense: 60 tablet; Refill: 0\   General Counseling: Aleana verbalizes understanding of the findings of todays visit and agrees with plan of treatment. I have discussed any further diagnostic evaluation that may be needed or ordered today. We also reviewed her medications today. she has been encouraged to call the office with any questions or concerns that should arise related to todays visit.    No orders of the defined types were placed in this encounter.   Meds ordered this encounter  Medications   Galcanezumab-gnlm (EMGALITY) 120 MG/ML SOSY    Sig: Inject 120 mg into the skin every 30 (thirty) days.    Dispense:  1 mL    Refill:  5    DX Code Needed  .   atorvastatin (LIPITOR) 10 MG tablet    Sig: Take 1 tablet (10 mg total) by mouth at bedtime.  Dispense:  90 tablet    Refill:  1   montelukast (SINGULAIR) 10 MG tablet    Sig: Take 1 tablet (10 mg total) by mouth at bedtime.    Dispense:  90 tablet     Refill:  1    Switch to 90 day supply.   ALPRAZolam (XANAX) 0.5 MG tablet    Sig: Take 1 tablet (0.5 mg total) by mouth 2 (two) times daily as needed for anxiety. for anxiety    Dispense:  60 tablet    Refill:  0   desonide (DESOWEN) 0.05 % cream    Sig: APPLY 1 APPLICATION  TOPICALLY 3 TIMES DAILY AS  NEEDED    Dispense:  60 g    Refill:  2   DISCONTD: Semaglutide-Weight Management 0.25 MG/0.5ML SOAJ    Sig: Inject 0.25 mg into the skin once a week.    Dispense:  2 mL    Refill:  1    BMI >27   rosuvastatin (CRESTOR) 10 MG tablet    Sig: Take 1 tablet (10 mg total) by mouth daily.    Dispense:  30 tablet    Refill:  2    Discontinue atorvastatin    Return in about 1 month (around 01/19/2022) for F/U, Weight loss, Brinna Divelbiss PCP.   Total time spent:30 Minutes Time spent includes review of chart, medications, test results, and follow up plan with the patient.   [place] Controlled Substance Database was reviewed by me.  This patient was seen by Jonetta Osgood, FNP-C in collaboration with Dr. Clayborn Bigness as a part of collaborative care agreement.   Jamiaya Bina R. Valetta Fuller, MSN, FNP-C Internal medicine

## 2021-12-24 ENCOUNTER — Telehealth: Payer: Self-pay

## 2021-12-24 MED ORDER — SEMAGLUTIDE-WEIGHT MANAGEMENT 0.25 MG/0.5ML ~~LOC~~ SOAJ: 0.2500 mg | SUBCUTANEOUS | 1 refills | Status: DC

## 2021-12-24 NOTE — Telephone Encounter (Signed)
PA for [facility] sent 12/24/21 @ 328pm

## 2021-12-25 ENCOUNTER — Encounter: Payer: Self-pay | Admitting: Nurse Practitioner

## 2021-12-27 ENCOUNTER — Telehealth: Payer: Self-pay

## 2021-12-27 NOTE — Telephone Encounter (Signed)
-----   Message from Alfonso Patten, MD sent at 12/27/2021  8:59 AM EDT ----- Please ask [facility] to section through the blocks on all of these. The leg in particular was sampled into the dermis - it was not a superficial shave.   Please let patient know two spots showed precancer (the leg and the left cheek) but we are requesting [facility] look through more layers of the samples for more information. Thank you!

## 2021-12-27 NOTE — Telephone Encounter (Signed)
Patient advised of information per Dr. Moye. aw 

## 2022-01-11 ENCOUNTER — Telehealth: Payer: Self-pay | Admitting: Dermatology

## 2022-01-11 NOTE — Telephone Encounter (Signed)
Can you please call [facility] again and check on the status of the levels? It's been a couple of weeks now, and I do not see an updated report yet. Thank you!

## 2022-01-18 ENCOUNTER — Ambulatory Visit: Payer: 59 | Admitting: Dermatology

## 2022-01-18 DIAGNOSIS — D1720 Benign lipomatous neoplasm of skin and subcutaneous tissue of unspecified limb: Secondary | ICD-10-CM

## 2022-01-18 DIAGNOSIS — L821 Other seborrheic keratosis: Secondary | ICD-10-CM

## 2022-01-18 DIAGNOSIS — D1739 Benign lipomatous neoplasm of skin and subcutaneous tissue of other sites: Secondary | ICD-10-CM

## 2022-01-18 DIAGNOSIS — L578 Other skin changes due to chronic exposure to nonionizing radiation: Secondary | ICD-10-CM

## 2022-01-18 DIAGNOSIS — L570 Actinic keratosis: Secondary | ICD-10-CM

## 2022-01-18 DIAGNOSIS — Z1283 Encounter for screening for malignant neoplasm of skin: Secondary | ICD-10-CM | POA: Diagnosis not present

## 2022-01-18 DIAGNOSIS — D229 Melanocytic nevi, unspecified: Secondary | ICD-10-CM

## 2022-01-18 DIAGNOSIS — L814 Other melanin hyperpigmentation: Secondary | ICD-10-CM

## 2022-01-18 DIAGNOSIS — D1800 Hemangioma unspecified site: Secondary | ICD-10-CM

## 2022-01-18 LAB — ANATOMIC PATHOLOGY REPORT

## 2022-01-18 NOTE — Patient Instructions (Addendum)
Cryotherapy Aftercare  Wash gently with soap and water everyday.   Apply Vaseline and Band-Aid daily until healed.   Recommend taking Heliocare sun protection supplement daily in sunny weather for additional sun protection. For maximum protection on the sunniest days, you can take up to 2 capsules of regular Heliocare OR take 1 capsule of Heliocare Ultra. For prolonged exposure (such as a full day in the sun), you can repeat your dose of the supplement 4 hours after your first dose. Heliocare can be purchased at [facility], at some [facility] or at www.heliocare.com.    Melanoma ABCDEs  Melanoma is the most dangerous type of skin cancer, and is the leading cause of death from skin disease.  You are more likely to develop melanoma if you: Have light-colored skin, light-colored eyes, or red or blond hair Spend a lot of time in the sun Tan regularly, either outdoors or in a tanning bed Have had blistering sunburns, especially during childhood Have a close family member who has had a melanoma Have atypical moles or large birthmarks  Early detection of melanoma is key since treatment is typically straightforward and cure rates are extremely high if we catch it early.   The first sign of melanoma is often a change in a mole or a new dark spot.  The ABCDE system is a way of remembering the signs of melanoma.  A for asymmetry:  The two halves do not match. B for border:  The edges of the growth are irregular. C for color:  A mixture of colors are present instead of an even brown color. D for diameter:  Melanomas are usually (but not always) greater than 6mm - the size of a pencil eraser. E for evolution:  The spot keeps changing in size, shape, and color.  Please check your skin once per month between visits. You can use a small mirror in front and a large mirror behind you to keep an eye on the back side or your body.   If you see any new or changing lesions before your next follow-up,  please call to schedule a visit.  Please continue daily skin protection including broad spectrum sunscreen SPF 30+ to sun-exposed areas, reapplying every 2 hours as needed when you're outdoors.    Due to recent changes in healthcare laws, you may see results of your pathology and/or laboratory studies on MyChart before the doctors have had a chance to review them. We understand that in some cases there may be results that are confusing or concerning to you. Please understand that not all results are received at the same time and often the doctors may need to interpret multiple results in order to provide you with the best plan of care or course of treatment. Therefore, we ask that you please give us 2 business days to thoroughly review all your results before contacting the office for clarification. Should we see a critical lab result, you will be contacted sooner.   If You Need Anything After Your Visit  If you have any questions or concerns for your doctor, please call our main line at [phone] and press option 4 to reach your doctor's medical assistant. If no one answers, please leave a voicemail as directed and we will return your call as soon as possible. Messages left after 4 pm will be answered the following business day.   You may also send us a message via MyChart. We typically respond to MyChart messages within 1-2 business days.    For prescription refills, please ask your pharmacy to contact our office. Our fax number is [phone].  If you have an urgent issue when the clinic is closed that cannot wait until the next business day, you can page your doctor at the number below.    Please note that while we do our best to be available for urgent issues outside of office hours, we are not available 24/7.   If you have an urgent issue and are unable to reach us, you may choose to seek medical care at your doctor's office, retail clinic, urgent care center, or emergency room.  If you  have a medical emergency, please immediately call 911 or go to the emergency department.  Pager Numbers  - Dr. Kowalski: [phone]  - Dr. Moye: [phone]  - Dr. Stewart: [phone]  In the event of inclement weather, please call our main line at [phone] for an update on the status of any delays or closures.  Dermatology Medication Tips: Please keep the boxes that topical medications come in in order to help keep track of the instructions about where and how to use these. Pharmacies typically print the medication instructions only on the boxes and not directly on the medication tubes.   If your medication is too expensive, please contact our office at [phone] option 4 or send us a message through MyChart.   We are unable to tell what your co-pay for medications will be in advance as this is different depending on your insurance coverage. However, we may be able to find a substitute medication at lower cost or fill out paperwork to get insurance to cover a needed medication.   If a prior authorization is required to get your medication covered by your insurance company, please allow us 1-2 business days to complete this process.  Drug prices often vary depending on where the prescription is filled and some pharmacies may offer cheaper prices.  The website www.goodrx.com contains coupons for medications through different pharmacies. The prices here do not account for what the cost may be with help from insurance (it may be cheaper with your insurance), but the website can give you the price if you did not use any insurance.  - You can print the associated coupon and take it with your prescription to the pharmacy.  - You may also stop by our office during regular business hours and pick up a GoodRx coupon card.  - If you need your prescription sent electronically to a different pharmacy, notify our office through [place] MyChart or by phone at [phone] option  4.     Si Usted Necesita Algo Despus de Su Visita  Tambin puede enviarnos un mensaje a travs de MyChart. Por lo general respondemos a los mensajes de MyChart en el transcurso de 1 a 2 das hbiles.  Para renovar recetas, por favor pida a su farmacia que se ponga en contacto con nuestra oficina. Nuestro nmero de fax es el [phone].  Si tiene un asunto urgente cuando la clnica est cerrada y que no puede esperar hasta el siguiente da hbil, puede llamar/localizar a su doctor(a) al nmero que aparece a continuacin.   Por favor, tenga en cuenta que aunque hacemos todo lo posible para estar disponibles para asuntos urgentes fuera del horario de oficina, no estamos disponibles las 24 horas del da, los 7 das de la semana.   Si tiene un problema urgente y no puede comunicarse con nosotros, puede optar por buscar atencin mdica  en   el consultorio de su doctor(a), en una clnica privada, en un centro de atencin urgente o en una sala de emergencias.  Si tiene una emergencia mdica, por favor llame inmediatamente al 911 o vaya a la sala de emergencias.  Nmeros de bper  - Dr. Kowalski: [phone]  - Dra. Moye: [phone]  - Dra. Stewart: [phone]  En caso de inclemencias del tiempo, por favor llame a nuestra lnea principal al [phone] para una actualizacin sobre el estado de cualquier retraso o cierre.  Consejos para la medicacin en dermatologa: Por favor, guarde las cajas en las que vienen los medicamentos de uso tpico para ayudarle a seguir las instrucciones sobre dnde y cmo usarlos. Las farmacias generalmente imprimen las instrucciones del medicamento slo en las cajas y no directamente en los tubos del medicamento.   Si su medicamento es muy caro, por favor, pngase en contacto con nuestra oficina llamando al [phone] y presione la opcin 4 o envenos un mensaje a travs de MyChart.   No podemos decirle cul ser su copago por los medicamentos por  adelantado ya que esto es diferente dependiendo de la cobertura de su seguro. Sin embargo, es posible que podamos encontrar un medicamento sustituto a menor costo o llenar un formulario para que el seguro cubra el medicamento que se considera necesario.   Si se requiere una autorizacin previa para que su compaa de seguros cubra su medicamento, por favor permtanos de 1 a 2 das hbiles para completar este proceso.  Los precios de los medicamentos varan con frecuencia dependiendo del lugar de dnde se surte la receta y alguna farmacias pueden ofrecer precios ms baratos.  El sitio web www.goodrx.com tiene cupones para medicamentos de diferentes farmacias. Los precios aqu no tienen en cuenta lo que podra costar con la ayuda del seguro (puede ser ms barato con su seguro), pero el sitio web puede darle el precio si no utiliz ningn seguro.  - Puede imprimir el cupn correspondiente y llevarlo con su receta a la farmacia.  - Tambin puede pasar por nuestra oficina durante el horario de atencin regular y recoger una tarjeta de cupones de GoodRx.  - Si necesita que su receta se enve electrnicamente a una farmacia diferente, informe a nuestra oficina a travs de MyChart de [place] o por telfono llamando al [phone] y presione la opcin 4.  

## 2022-01-18 NOTE — Progress Notes (Signed)
Follow-Up Visit   Subjective  Kathy Mccormick is a 66 y.o. female who presents for the following: FBSE (The patient presents for Total-Body Skin Exam (TBSE) for skin cancer screening and mole check.  The patient has spots, moles and lesions to be evaluated, some may be new or changing and the patient has concerns that these could be cancer. Patient with hx of AK's and questionable SCC but not confirmed due to inadequate biopsy. ).  Patient also here today for biopsy follow up.   The following portions of the chart were reviewed this encounter and updated as appropriate:   Tobacco  Allergies  Meds  Problems  Med Hx  Surg Hx  Fam Hx      Review of Systems:  No other skin or systemic complaints except as noted in HPI or Assessment and Plan.  Objective  Well appearing patient in no apparent distress; mood and affect are within normal limits.  A full examination was performed including scalp, head, eyes, ears, nose, lips, neck, chest, axillae, abdomen, back, buttocks, bilateral upper extremities, bilateral lower extremities, hands, feet, fingers, toes, fingernails, and toenails. All findings within normal limits unless otherwise noted below.  L upper lip x 1, R antihelix x 2, L temple x 1, R forearm x 5, R upper arm x 1, R sup breast x 1, R distal pretibia x 1, L forearm x 6, L dorsal hand x 1, L antecubital fossa x 1, L calf x 1 (21) Erythematous thin papules/macules with gritty scale.   Left Shoulder - Posterior Soft subcutaneous papule    Assessment & Plan  AK (actinic keratosis) (21) L upper lip x 1, R antihelix x 2, L temple x 1, R forearm x 5, R upper arm x 1, R sup breast x 1, R distal pretibia x 1, L forearm x 6, L dorsal hand x 1, L antecubital fossa x 1, L calf x 1  Actinic keratoses are precancerous spots that appear secondary to cumulative UV radiation exposure/sun exposure over time. They are chronic with expected duration over 1 year. A portion of actinic keratoses  will progress to squamous cell carcinoma of the skin. It is not possible to reliably predict which spots will progress to skin cancer and so treatment is recommended to prevent development of skin cancer.  Recommend daily broad spectrum sunscreen SPF 30+ to sun-exposed areas, reapply every 2 hours as needed.  Recommend staying in the shade or wearing long sleeves, sun glasses (UVA+UVB protection) and wide brim hats (4-inch brim around the entire circumference of the hat). Call for new or changing lesions.  Prior to procedure, discussed risks of blister formation, small wound, skin dyspigmentation, or rare scar following cryotherapy. Recommend Vaseline ointment to treated areas while healing.  Clinically favor AK today at left upper lip. Consider bx if not resolved on follow up. Consider bx at right superior breast if not resolved on follow up.  Destruction of lesion - L upper lip x 1, R antihelix x 2, L temple x 1, R forearm x 5, R upper arm x 1, R sup breast x 1, R distal pretibia x 1, L forearm x 6, L dorsal hand x 1, L antecubital fossa x 1, L calf x 1  Destruction method: cryotherapy   Informed consent: discussed and consent obtained   Lesion destroyed using liquid nitrogen: Yes   Cryotherapy cycles:  2 Outcome: patient tolerated procedure well with no complications   Post-procedure details: wound care instructions given  Lipoma of upper extremity, unspecified laterality Left Shoulder - Posterior  C/w lipoma  Benign-appearing.  Observation.  Call clinic for new or changing lesions.     Lentigines - Scattered tan macules - Due to sun exposure - Benign-appearing, observe - Recommend daily broad spectrum sunscreen SPF 30+ to sun-exposed areas, reapply every 2 hours as needed. - Call for any changes  Seborrheic Keratoses - Stuck-on, waxy, tan-brown papules and/or plaques  - Benign-appearing - Discussed benign etiology and prognosis. - Observe - Call for any  changes  Melanocytic Nevi - Tan-brown and/or pink-flesh-colored symmetric macules and papules - Benign appearing on exam today - Observation - Call clinic for new or changing moles - Recommend daily use of broad spectrum spf 30+ sunscreen to sun-exposed areas.   Hemangiomas - Red papules - Discussed benign nature - Observe - Call for any changes  Actinic Damage - Severe, confluent actinic changes with pre-cancerous actinic keratoses  - Severe, chronic, not at goal, secondary to cumulative UV radiation exposure over time - diffuse scaly erythematous macules and papules with underlying dyspigmentation - Discussed Prescription "Field Treatment" for Severe, Chronic Confluent Actinic Changes with Pre-Cancerous Actinic Keratoses Field treatment involves treatment of an entire area of skin that has confluent Actinic Changes (Sun/ Ultraviolet light damage) and PreCancerous Actinic Keratoses by method of PhotoDynamic Therapy (PDT) and/or prescription Topical Chemotherapy agents such as 5-fluorouracil, 5-fluorouracil/calcipotriene, and/or imiquimod.  The purpose is to decrease the number of clinically evident and subclinical PreCancerous lesions to prevent progression to development of skin cancer by chemically destroying early precancer changes that may or may not be visible.  It has been shown to reduce the risk of developing skin cancer in the treated area. As a result of treatment, redness, scaling, crusting, and open sores may occur during treatment course. One or more than one of these methods may be used and may have to be used several times to control, suppress and eliminate the PreCancerous changes. Discussed treatment course, expected reaction, and possible side effects. - Recommend daily broad spectrum sunscreen SPF 30+ to sun-exposed areas, reapply every 2 hours as needed.  - Staying in the shade or wearing long sleeves, sun glasses (UVA+UVB protection) and wide brim hats (4-inch brim around  the entire circumference of the hat) are also recommended. - Call for new or changing lesions. - plan PDT to face and neck in January 2024  Skin cancer screening performed today.   Return for PDT to face and neck in January 2024, 6 month TBSE.  Graciella Belton, RMA, am acting as scribe for Forest Gleason, MD .  Documentation: I have reviewed the above documentation for accuracy and completeness, and I agree with the above.  Forest Gleason, MD

## 2022-01-23 ENCOUNTER — Encounter: Payer: Self-pay | Admitting: Dermatology

## 2022-01-30 ENCOUNTER — Ambulatory Visit: Payer: 59 | Admitting: Nurse Practitioner

## 2022-02-11 ENCOUNTER — Other Ambulatory Visit: Payer: Self-pay | Admitting: Nurse Practitioner

## 2022-02-11 DIAGNOSIS — G43009 Migraine without aura, not intractable, without status migrainosus: Secondary | ICD-10-CM

## 2022-02-27 ENCOUNTER — Ambulatory Visit: Payer: 59 | Admitting: Nurse Practitioner

## 2022-02-28 ENCOUNTER — Encounter: Payer: Self-pay | Admitting: Nurse Practitioner

## 2022-02-28 ENCOUNTER — Ambulatory Visit: Payer: 59 | Admitting: Nurse Practitioner

## 2022-02-28 VITALS — BP 132/75 | HR 70 | Temp 98.4°F | Resp 16 | Ht 65.0 in | Wt 160.6 lb

## 2022-02-28 DIAGNOSIS — Z6827 Body mass index (BMI) 27.0-27.9, adult: Secondary | ICD-10-CM | POA: Diagnosis not present

## 2022-02-28 DIAGNOSIS — B3731 Acute candidiasis of vulva and vagina: Secondary | ICD-10-CM | POA: Diagnosis not present

## 2022-02-28 DIAGNOSIS — I10 Essential (primary) hypertension: Secondary | ICD-10-CM | POA: Diagnosis not present

## 2022-02-28 MED ORDER — FLUCONAZOLE 150 MG PO TABS: 150.0000 mg | ORAL_TABLET | Freq: Once | ORAL | 0 refills | Status: AC

## 2022-02-28 MED ORDER — DIETHYLPROPION HCL ER 75 MG PO TB24: 75.0000 mg | ORAL_TABLET | Freq: Every day | ORAL | 0 refills | Status: DC

## 2022-02-28 NOTE — Progress Notes (Signed)
[facility] [place], [place] [address]  Internal MEDICINE  Office Visit Note  Patient Name: Kathy Mccormick  [id number]  [id number]  Date of Service: 02/28/2022  Chief Complaint  Patient presents with   Follow-up   Anxiety   Hyperlipidemia   Hypertension   Vaginitis    Itchy, tried AZO - wants Diflucan    HPI Kathy Mccormick presents for a follow up visit for weight loss management, yeast infection and hypertension. BP controlled with current medications Mancel Parsons is on back order, approved but still waiting, interested in trying something else in the meantime Vaginal itching, tried AZO with no relief. Asking for fluconazole.    Current Medication: Outpatient Encounter Medications as of 02/28/2022  Medication Sig   acyclovir (ZOVIRAX) 400 MG tablet TAKE 1 TABLET BY MOUTH TWICE  DAILY   ALPRAZolam (XANAX) 0.5 MG tablet Take 1 tablet (0.5 mg total) by mouth 2 (two) times daily as needed for anxiety. for anxiety   Azelaic Acid 15 % gel GENTLY BUT THOROUGHLY MASSAGE A  THIN FILM TO AFFECTED AREA(S)  TOPICALLY IN THE MORNING AND  EVENING AFTER SKIN IS THOROUGHLY WASHED AND PATTED DRY   busPIRone (BUSPAR) 5 MG tablet TAKE 1-2 TABLETS (5-10 MG TOTAL) BY MOUTH 2 (TWO) TIMES DAILY.   CHERRY PO Take by mouth.   desonide (DESOWEN) 0.05 % cream APPLY 1 APPLICATION  TOPICALLY 3 TIMES DAILY AS  NEEDED   Diethylpropion HCl CR 75 MG TB24 Take 1 tablet (75 mg total) by mouth daily before breakfast.   doxycycline (VIBRA-TABS) 100 MG tablet TAKE 1 TABLET BY MOUTH DAILY   DULoxetine (CYMBALTA) 60 MG capsule TAKE 1 CAPSULE BY MOUTH 1 TO 2  TIMES DAILY   estradiol (ESTRACE) 0.1 MG/GM vaginal cream Place 1 Applicatorful vaginally 2 (two) times a week.   fluconazole (DIFLUCAN) 150 MG tablet Take 1 tablet (150 mg total) by mouth once for 1 dose. May take an additional dose after 3 days if still symptomatic.   fluticasone (FLONASE) 50 MCG/ACT nasal spray Place 1 spray into both nostrils  daily.   Galcanezumab-gnlm (EMGALITY) 120 MG/ML SOSY Inject 120 mg into the skin every 30 (thirty) days.   levothyroxine (SYNTHROID) 50 MCG tablet TAKE 1 TABLET BY MOUTH DAILY  BEFORE BREAKFAST   losartan (COZAAR) 100 MG tablet TAKE 1 TABLET BY MOUTH DAILY   meloxicam (MOBIC) 15 MG tablet TAKE 1 TABLET BY MOUTH DAILY AS  NEEDED   montelukast (SINGULAIR) 10 MG tablet Take 1 tablet (10 mg total) by mouth at bedtime.   Multiple Vitamin (MULTIVITAMIN) tablet Take 1 tablet by mouth daily.   mupirocin ointment (BACTROBAN) 2 % Apply to wound once daily with bandage change   omeprazole (PRILOSEC) 40 MG capsule TAKE 1 CAPSULE BY MOUTH DAILY   rosuvastatin (CRESTOR) 10 MG tablet Take 1 tablet (10 mg total) by mouth daily.   Semaglutide-Weight Management 0.25 MG/0.5ML SOAJ Inject 0.25 mg into the skin once a week.   zolmitriptan (ZOMIG-ZMT) 5 MG disintegrating tablet TAKE 1 TABLET BY MOUTH AS NEEDED FOR MIGRAINES. MAY REPEAT AFTER 2-3 HRS. MAX DOSE 2 TABS IN 24 HRS   zolpidem (AMBIEN CR) 12.5 MG CR tablet TAKE 1 TABLET BY MOUTH AT  BEDTIME AS NEEDED FOR SLEEP   No facility-administered encounter medications on file as of 02/28/2022.    Surgical History: Past Surgical History:  Procedure Laterality Date   BACK SURGERY     CARPAL TUNNEL RELEASE     CARPAL TUNNEL  RELEASE Bilateral 2005   CHOLECYSTECTOMY     COLPOSCOPY     OOPHORECTOMY     RSO,LSO   PELVIC LAPAROSCOPY  2001,2004   DL X 2 RSO and LSO   TONSILLECTOMY     TRIGGER FINGER RELEASE Right 08/24/2021   VAGINAL HYSTERECTOMY  2000   endometriosis    Medical History: Past Medical History:  Diagnosis Date   Allergy    environmental   Anxiety    CIN I (cervical intraepithelial neoplasia I)    Endometriosis    GERD (gastroesophageal reflux disease)    History of actinic keratoses    Hyperlipidemia    Hypertension    Migraine    Migraines    Neck pain    STD (sexually transmitted disease)    HSV ll    Family History: Family  History  Problem Relation Age of Onset   Hypertension Father    Diabetes Mother    Hypertension Mother    Uterine cancer Mother    Liver disease Mother    Hypertension Brother    Heart Problems Brother    Breast cancer Maternal Aunt        Age 92's   Aneurysm Maternal Aunt     Social History   Socioeconomic History   Marital status: Married    Spouse name: Not on file   Number of children: Not on file   Years of education: Not on file   Highest education level: Not on file  Occupational History   Not on file  Tobacco Use   Smoking status: Former   Smokeless tobacco: Never  Vaping Use   Vaping Use: Never used  Substance and Sexual Activity   Alcohol use: No    Alcohol/week: 0.0 standard drinks of alcohol   Drug use: No   Sexual activity: Not Currently    Birth control/protection: Surgical    Comment: 1st intercourse 66 yo-More than 5 partners  Other Topics Concern   Not on file  Social History Narrative   Not on file   Social Determinants of Health   Financial Resource Strain: Not on file  Food Insecurity: Not on file  Transportation Needs: Not on file  Physical Activity: Not on file  Stress: Not on file  Social Connections: Not on file  Intimate Partner Violence: Not on file      Review of Systems  Constitutional:  Negative for chills, fatigue and unexpected weight change.  HENT:  Negative for congestion, rhinorrhea, sneezing and sore throat.   Eyes:  Negative for redness.  Respiratory: Negative.  Negative for cough, chest tightness, shortness of breath and wheezing.   Cardiovascular: Negative.  Negative for chest pain and palpitations.  Gastrointestinal:  Negative for abdominal pain, constipation, diarrhea, nausea and vomiting.  Genitourinary:  Negative for dysuria and frequency.  Musculoskeletal:  Positive for arthralgias. Negative for joint swelling and neck pain.  Skin:  Negative for rash.  Neurological: Negative.  Negative for tremors and numbness.   Hematological:  Negative for adenopathy. Does not bruise/bleed easily.  Psychiatric/Behavioral:  Negative for behavioral problems (Depression), sleep disturbance and suicidal ideas. The patient is not nervous/anxious.     Vital Signs: BP 132/75   Pulse 70   Temp 98.4 F (36.9 C)   Resp 16   Ht '5\' 5"'$  (1.651 m)   Wt 160 lb 9.6 oz (72.8 kg)   SpO2 99%   BMI 26.73 kg/m    Physical Exam Vitals reviewed.  Constitutional:  General: She is not in acute distress.    Appearance: Normal appearance. She is normal weight. She is not ill-appearing.  HENT:     Head: Normocephalic and atraumatic.  Eyes:     Pupils: Pupils are equal, round, and reactive to light.  Cardiovascular:     Rate and Rhythm: Normal rate and regular rhythm.  Pulmonary:     Effort: Pulmonary effort is normal. No respiratory distress.  Neurological:     Mental Status: She is alert and oriented to person, place, and time.     Cranial Nerves: No cranial nerve deficit.     Gait: Gait normal.  Psychiatric:        Mood and Affect: Mood normal.        Behavior: Behavior normal.       Assessment/Plan: 1. Essential (primary) hypertension BP controlled with current medications  2. Vulvovaginal candidiasis Fluconazole prescribed - fluconazole (DIFLUCAN) 150 MG tablet; Take 1 tablet (150 mg total) by mouth once for 1 dose. May take an additional dose after 3 days if still symptomatic.  Dispense: 3 tablet; Refill: 0  3. BMI 27.0-27.9,adult Try diethylpropion while waiting for wegovy to get back in stock - Diethylpropion HCl CR 75 MG TB24; Take 1 tablet (75 mg total) by mouth daily before breakfast.  Dispense: 30 tablet; Refill: 0   General Counseling: Tika verbalizes understanding of the findings of todays visit and agrees with plan of treatment. I have discussed any further diagnostic evaluation that may be needed or ordered today. We also reviewed her medications today. she has been encouraged to call the  office with any questions or concerns that should arise related to todays visit.    No orders of the defined types were placed in this encounter.   Meds ordered this encounter  Medications   fluconazole (DIFLUCAN) 150 MG tablet    Sig: Take 1 tablet (150 mg total) by mouth once for 1 dose. May take an additional dose after 3 days if still symptomatic.    Dispense:  3 tablet    Refill:  0   Diethylpropion HCl CR 75 MG TB24    Sig: Take 1 tablet (75 mg total) by mouth daily before breakfast.    Dispense:  30 tablet    Refill:  0    Patient will bring good rx coupon, do not run through her insurance    Return in about 4 weeks (around 03/28/2022) for F/U, Weight loss, Jeren Dufrane PCP.   Total time spent:30 Minutes Time spent includes review of chart, medications, test results, and follow up plan with the patient.   [place] Controlled Substance Database was reviewed by me.  This patient was seen by Jonetta Osgood, FNP-C in collaboration with Dr. Clayborn Bigness as a part of collaborative care agreement.   Doryan Bahl R. Valetta Fuller, MSN, FNP-C Internal medicine

## 2022-03-07 ENCOUNTER — Other Ambulatory Visit: Payer: Self-pay | Admitting: Nurse Practitioner

## 2022-03-07 DIAGNOSIS — J3089 Other allergic rhinitis: Secondary | ICD-10-CM

## 2022-03-28 ENCOUNTER — Ambulatory Visit: Payer: 59 | Admitting: Nurse Practitioner

## 2022-04-13 ENCOUNTER — Telehealth: Payer: Self-pay | Admitting: Internal Medicine

## 2022-04-13 NOTE — Telephone Encounter (Signed)
MR & itemized billing statement faxed to [employer] [phone]

## 2022-05-01 ENCOUNTER — Encounter: Payer: Self-pay | Admitting: Nurse Practitioner

## 2022-05-26 ENCOUNTER — Other Ambulatory Visit: Payer: Self-pay | Admitting: Nurse Practitioner

## 2022-05-26 DIAGNOSIS — F410 Panic disorder [episodic paroxysmal anxiety] without agoraphobia: Secondary | ICD-10-CM

## 2022-05-28 NOTE — Telephone Encounter (Signed)
Pt need appt for refills  ?

## 2022-06-17 ENCOUNTER — Other Ambulatory Visit: Payer: Self-pay | Admitting: Nurse Practitioner

## 2022-06-17 DIAGNOSIS — F410 Panic disorder [episodic paroxysmal anxiety] without agoraphobia: Secondary | ICD-10-CM

## 2022-06-18 ENCOUNTER — Other Ambulatory Visit: Payer: Self-pay

## 2022-06-18 DIAGNOSIS — G43009 Migraine without aura, not intractable, without status migrainosus: Secondary | ICD-10-CM

## 2022-06-18 MED ORDER — EMGALITY 120 MG/ML ~~LOC~~ SOSY: 120.0000 mg | PREFILLED_SYRINGE | SUBCUTANEOUS | 0 refills | Status: DC

## 2022-07-04 ENCOUNTER — Ambulatory Visit
Admission: EM | Admit: 2022-07-04 | Discharge: 2022-07-04 | Disposition: A | Discharge status: Home / Self Care | Payer: 59

## 2022-07-04 DIAGNOSIS — B349 Viral infection, unspecified: Secondary | ICD-10-CM | POA: Diagnosis not present

## 2022-07-04 NOTE — ED Provider Notes (Signed)
Roderic Palau    CSN: [id number] Arrival date & time: 07/04/22  1604      History   Chief Complaint Chief Complaint  Patient presents with   Cough    HPI Kathy Mccormick is a 67 y.o. female.  Patient presents with chills, fatigue, congestion, cough x 1 day.  She reports nausea and one episode of loose stool this morning.  No fever, shortness of breath, vomiting, or other symptoms.  Treatment at home with Tylenol this morning.  Her medical history includes hypertension, hyperlipidemia, hypothyroidism, GERD, migraine headaches.   The history is provided by the patient and medical records.    Past Medical History:  Diagnosis Date   Allergy    environmental   Anxiety    CIN I (cervical intraepithelial neoplasia I)    Endometriosis    GERD (gastroesophageal reflux disease)    History of actinic keratoses    Hyperlipidemia    Hypertension    Migraine    Migraines    Neck pain    STD (sexually transmitted disease)    HSV ll    Patient Active Problem List   Diagnosis Date Noted   Cellulitis of left lower leg 11/01/2019   Candidal vaginitis 11/01/2019   Migraine without aura and without status migrainosus, not intractable 06/21/2019   Encounter for long-term (current) use of medications 05/31/2019   Dysuria 05/31/2019   Mixed hyperlipidemia 01/21/2019   Generalized anxiety disorder with panic attacks 01/21/2019   Rosacea 01/21/2019   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   Primary generalized (osteo)arthritis 03/30/2018   BMI 27.0-27.9,adult 09/01/2017   Hypothyroidism 09/01/2017   Acute pain of left shoulder 09/01/2017   Essential (primary) hypertension 07/15/2017   Ingrown toenail 01/15/2016   Migraines     Past Surgical History:  Procedure Laterality Date   BACK SURGERY     CARPAL TUNNEL RELEASE     CARPAL TUNNEL RELEASE Bilateral 2005   CHOLECYSTECTOMY     COLPOSCOPY     OOPHORECTOMY     RSO,LSO   PELVIC LAPAROSCOPY  2001,2004   DL X 2 RSO and LSO    TONSILLECTOMY     TRIGGER FINGER RELEASE Right 08/24/2021   VAGINAL HYSTERECTOMY  2000   endometriosis    OB History     Gravida  1   Para      Term      Preterm      AB  1   Living  0      SAB      IAB      Ectopic      Multiple      Live Births               Home Medications    Prior to Admission medications   Medication Sig Start Date End Date Taking? Authorizing Provider  acyclovir (ZOVIRAX) 400 MG tablet TAKE 1 TABLET BY MOUTH TWICE  DAILY 12/17/21   Jonetta Osgood, NP  ALPRAZolam (XANAX) 0.5 MG tablet Take 1 tablet (0.5 mg total) by mouth 2 (two) times daily as needed for anxiety. for anxiety 12/20/21   Jonetta Osgood, NP  Azelaic Acid 15 % gel GENTLY BUT THOROUGHLY MASSAGE A  THIN FILM TO AFFECTED AREA(S)  TOPICALLY IN THE MORNING AND  EVENING AFTER SKIN IS THOROUGHLY WASHED AND PATTED DRY 12/17/21   Abernathy, Yetta Flock, NP  busPIRone (BUSPAR) 5 MG tablet TAKE 1-2 TABLETS (5-10 MG TOTAL) BY MOUTH 2 (TWO) TIMES DAILY. 05/28/22  Jonetta Osgood, NP  CHERRY PO Take by mouth.    [provider]  desonide (DESOWEN) 0.05 % cream APPLY 1 APPLICATION  TOPICALLY 3 TIMES DAILY AS  NEEDED 12/20/21   Jonetta Osgood, NP  Diethylpropion HCl CR 75 MG TB24 Take 1 tablet (75 mg total) by mouth daily before breakfast. 02/28/22   Abernathy, Yetta Flock, NP  doxycycline (VIBRA-TABS) 100 MG tablet TAKE 1 TABLET BY MOUTH DAILY 12/17/21   Jonetta Osgood, NP  DULoxetine (CYMBALTA) 60 MG capsule TAKE 1 CAPSULE BY MOUTH 1 TO 2  TIMES DAILY 10/25/21   Jonetta Osgood, NP  estradiol (ESTRACE) 0.1 MG/GM vaginal cream Place 1 Applicatorful vaginally 2 (two) times a week. 09/21/21   Tamela Gammon, NP  fluticasone (FLONASE) 50 MCG/ACT nasal spray Place 1 spray into both nostrils daily. 12/14/20   Jonetta Osgood, NP  Galcanezumab-gnlm (EMGALITY) 120 MG/ML SOSY Inject 120 mg into the skin every 30 (thirty) days. 06/18/22   Jonetta Osgood, NP  levothyroxine (SYNTHROID)  50 MCG tablet TAKE 1 TABLET BY MOUTH DAILY  BEFORE BREAKFAST 10/25/21   Jonetta Osgood, NP  losartan (COZAAR) 100 MG tablet TAKE 1 TABLET BY MOUTH DAILY 10/25/21   Jonetta Osgood, NP  meloxicam (MOBIC) 15 MG tablet TAKE 1 TABLET BY MOUTH DAILY AS  NEEDED 10/25/21   Jonetta Osgood, NP  montelukast (SINGULAIR) 10 MG tablet TAKE 1 TABLET BY MOUTH EVERYDAY AT BEDTIME 03/07/22   Lavera Guise, MD  Multiple Vitamin (MULTIVITAMIN) tablet Take 1 tablet by mouth daily.    [provider]  mupirocin ointment (BACTROBAN) 2 % Apply to wound once daily with bandage change 12/19/21   Moye, [place], MD  omeprazole (PRILOSEC) 40 MG capsule TAKE 1 CAPSULE BY MOUTH DAILY 10/25/21   Jonetta Osgood, NP  rosuvastatin (CRESTOR) 10 MG tablet Take 1 tablet (10 mg total) by mouth daily. 12/20/21   Jonetta Osgood, NP  Semaglutide-Weight Management 0.25 MG/0.5ML SOAJ Inject 0.25 mg into the skin once a week. 12/24/21   Jonetta Osgood, NP  zolmitriptan (ZOMIG-ZMT) 5 MG disintegrating tablet TAKE 1 TABLET BY MOUTH AS NEEDED FOR MIGRAINES. MAY REPEAT AFTER 2-3 HRS. MAX DOSE 2 TABS IN 24 HRS 02/11/22   Abernathy, Yetta Flock, NP  zolpidem (AMBIEN CR) 12.5 MG CR tablet TAKE 1 TABLET BY MOUTH AT  BEDTIME AS NEEDED FOR SLEEP 12/18/21   Jonetta Osgood, NP    Family History Family History  Problem Relation Age of Onset   Hypertension Father    Diabetes Mother    Hypertension Mother    Uterine cancer Mother    Liver disease Mother    Hypertension Brother    Heart Problems Brother    Breast cancer Maternal Aunt        Age 66's   Aneurysm Maternal Aunt     Social History Social History   Tobacco Use   Smoking status: Former   Smokeless tobacco: Never  Scientific laboratory technician Use: Never used  Substance Use Topics   Alcohol use: No    Alcohol/week: 0.0 standard drinks of alcohol   Drug use: No     Allergies   Patient has no known allergies.   Review of Systems Review of Systems  Constitutional:   Positive for chills and fatigue. Negative for fever.  HENT:  Positive for congestion. Negative for ear pain and sore throat.   Respiratory:  Positive for cough. Negative for shortness of breath.   Cardiovascular:  Negative for chest pain and palpitations.  Gastrointestinal:  Positive for nausea. Negative for abdominal pain, diarrhea and vomiting.  Skin:  Negative for rash.  All other systems reviewed and are negative.    Physical Exam Triage Vital Signs ED Triage Vitals  Enc Vitals Group     BP 07/04/22 1628 122/78     Pulse Rate 07/04/22 1618 90     Resp 07/04/22 1618 18     Temp 07/04/22 1618 98.1 F (36.7 C)     Temp src --      SpO2 07/04/22 1618 99 %     Weight 07/04/22 1627 160 lb (72.6 kg)     Height 07/04/22 1627 '5\' 5"'$  (1.651 m)     Head Circumference --      Peak Flow --      Pain Score 07/04/22 1626 3     Pain Loc --      Pain Edu? --      Excl. in [place]? --    No data found.  Updated Vital Signs BP 122/78   Pulse 90   Temp 98.1 F (36.7 C)   Resp 18   Ht '5\' 5"'$  (1.651 m)   Wt 160 lb (72.6 kg)   SpO2 99%   BMI 26.63 kg/m   Visual Acuity Right Eye Distance:   Left Eye Distance:   Bilateral Distance:    Right Eye Near:   Left Eye Near:    Bilateral Near:     Physical Exam Vitals and nursing note reviewed.  Constitutional:      General: She is not in acute distress.    Appearance: Normal appearance. She is well-developed. She is not ill-appearing.  HENT:     Right Ear: Tympanic membrane normal.     Left Ear: Tympanic membrane normal.     Nose: Rhinorrhea present.     Mouth/Throat:     Mouth: Mucous membranes are moist.     Pharynx: Oropharynx is clear.  Cardiovascular:     Rate and Rhythm: Normal rate and regular rhythm.     Heart sounds: Normal heart sounds.  Pulmonary:     Effort: Pulmonary effort is normal. No respiratory distress.     Breath sounds: Normal breath sounds.  Abdominal:     General: Bowel sounds are normal.     Palpations:  Abdomen is soft.     Tenderness: There is no abdominal tenderness. There is no guarding or rebound.  Musculoskeletal:     Cervical back: Neck supple.  Skin:    General: Skin is warm and dry.  Neurological:     Mental Status: She is alert.  Psychiatric:        Mood and Affect: Mood normal.        Behavior: Behavior normal.      UC Treatments / Results  Labs (all labs ordered are listed, but only abnormal results are displayed) Labs Reviewed - No data to display  EKG   Radiology No results found.  Procedures Procedures (including critical care time)  Medications Ordered in UC Medications - No data to display  Initial Impression / Assessment and Plan / UC Course  I have reviewed the triage vital signs and the nursing notes.  Pertinent labs & imaging results that were available during my care of the patient were reviewed by me and considered in my medical decision making (see chart for details).    Viral illness.  Afebrile, VSS. Patient declines COVID test.  Discussed symptomatic treatment including Tylenol, rest, hydration.  Instructed patient to follow up with her PCP if symptoms are not improving.  Work note provided per patient request.  She agrees to plan of care.   Final Clinical Impressions(s) / UC Diagnoses   Final diagnoses:  Viral illness     Discharge Instructions      Take Tylenol as needed for fever or discomfort.  Rest and keep yourself hydrated.    Follow-up with your primary care provider if your symptoms are not improving.         ED Prescriptions   None    PDMP not reviewed this encounter.   Sharion Balloon, NP 07/04/22 [phone]

## 2022-07-04 NOTE — ED Triage Notes (Signed)
Patient to Urgent Care with complaints of head and sins congestion, cough, and fatigue. Reports post nasal drip. Denies any known fevers, did have chills and swears last night.   Symptoms started yesterday morning. Reports she had some nausea and diarrhea x1 episode this morning when she woke up.   Has been taking tylenol and using a nose spray.  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

## 2022-07-04 NOTE — Discharge Instructions (Addendum)
Take Tylenol as needed for fever or discomfort.  Rest and keep yourself hydrated.    Follow-up with your primary care provider if your symptoms are not improving.     

## 2022-07-09 ENCOUNTER — Encounter: Payer: Self-pay | Admitting: Nurse Practitioner

## 2022-07-09 ENCOUNTER — Ambulatory Visit: Payer: 59 | Admitting: Nurse Practitioner

## 2022-07-09 VITALS — BP 130/70 | HR 88 | Temp 97.1°F | Resp 16 | Ht 65.0 in | Wt 162.4 lb

## 2022-07-09 DIAGNOSIS — I10 Essential (primary) hypertension: Secondary | ICD-10-CM | POA: Diagnosis not present

## 2022-07-09 DIAGNOSIS — E782 Mixed hyperlipidemia: Secondary | ICD-10-CM

## 2022-07-09 DIAGNOSIS — Z760 Encounter for issue of repeat prescription: Secondary | ICD-10-CM

## 2022-07-09 DIAGNOSIS — J3089 Other allergic rhinitis: Secondary | ICD-10-CM

## 2022-07-09 DIAGNOSIS — G43009 Migraine without aura, not intractable, without status migrainosus: Secondary | ICD-10-CM

## 2022-07-09 DIAGNOSIS — B009 Herpesviral infection, unspecified: Secondary | ICD-10-CM

## 2022-07-09 DIAGNOSIS — K219 Gastro-esophageal reflux disease without esophagitis: Secondary | ICD-10-CM

## 2022-07-09 DIAGNOSIS — F410 Panic disorder [episodic paroxysmal anxiety] without agoraphobia: Secondary | ICD-10-CM

## 2022-07-09 DIAGNOSIS — E039 Hypothyroidism, unspecified: Secondary | ICD-10-CM

## 2022-07-09 DIAGNOSIS — Z6827 Body mass index (BMI) 27.0-27.9, adult: Secondary | ICD-10-CM

## 2022-07-09 DIAGNOSIS — L719 Rosacea, unspecified: Secondary | ICD-10-CM

## 2022-07-09 DIAGNOSIS — J309 Allergic rhinitis, unspecified: Secondary | ICD-10-CM

## 2022-07-09 DIAGNOSIS — M150 Primary generalized (osteo)arthritis: Secondary | ICD-10-CM

## 2022-07-09 DIAGNOSIS — F5101 Primary insomnia: Secondary | ICD-10-CM

## 2022-07-09 MED ORDER — AZELAIC ACID 15 % EX GEL: CUTANEOUS | 1 refills | Status: DC

## 2022-07-09 MED ORDER — DIETHYLPROPION HCL ER 75 MG PO TB24: 75.0000 mg | ORAL_TABLET | Freq: Every day | ORAL | 0 refills | Status: DC

## 2022-07-09 MED ORDER — ROSUVASTATIN CALCIUM 10 MG PO TABS: 10.0000 mg | ORAL_TABLET | Freq: Every day | ORAL | 3 refills | Status: DC

## 2022-07-09 MED ORDER — MELOXICAM 15 MG PO TABS: 15.0000 mg | ORAL_TABLET | Freq: Every day | ORAL | 3 refills | Status: DC | PRN

## 2022-07-09 MED ORDER — OMEPRAZOLE 40 MG PO CPDR: 40.0000 mg | DELAYED_RELEASE_CAPSULE | Freq: Every day | ORAL | 3 refills | Status: DC

## 2022-07-09 MED ORDER — FLUTICASONE PROPIONATE 50 MCG/ACT NA SUSP: 1.0000 | Freq: Every day | NASAL | 1 refills | Status: DC

## 2022-07-09 MED ORDER — EMGALITY 120 MG/ML ~~LOC~~ SOSY: 120.0000 mg | PREFILLED_SYRINGE | SUBCUTANEOUS | 5 refills | Status: DC

## 2022-07-09 MED ORDER — BUSPIRONE HCL 5 MG PO TABS: 5.0000 mg | ORAL_TABLET | Freq: Two times a day (BID) | ORAL | 0 refills | Status: DC

## 2022-07-09 MED ORDER — ALPRAZOLAM 0.5 MG PO TABS: 0.5000 mg | ORAL_TABLET | Freq: Two times a day (BID) | ORAL | 0 refills | Status: DC | PRN

## 2022-07-09 MED ORDER — DOXYCYCLINE HYCLATE 100 MG PO TABS: 100.0000 mg | ORAL_TABLET | Freq: Every day | ORAL | 1 refills | Status: DC

## 2022-07-09 MED ORDER — ZOLMITRIPTAN 5 MG PO TBDP: ORAL_TABLET | ORAL | 3 refills | Status: DC

## 2022-07-09 MED ORDER — ACYCLOVIR 400 MG PO TABS: 400.0000 mg | ORAL_TABLET | Freq: Two times a day (BID) | ORAL | 1 refills | Status: DC

## 2022-07-09 MED ORDER — LOSARTAN POTASSIUM 100 MG PO TABS: 100.0000 mg | ORAL_TABLET | Freq: Every day | ORAL | 3 refills | Status: DC

## 2022-07-09 MED ORDER — DESONIDE 0.05 % EX CREA: TOPICAL_CREAM | CUTANEOUS | 2 refills | Status: DC

## 2022-07-09 MED ORDER — LEVOTHYROXINE SODIUM 50 MCG PO TABS: 50.0000 ug | ORAL_TABLET | Freq: Every day | ORAL | 3 refills | Status: DC

## 2022-07-09 MED ORDER — DULOXETINE HCL 60 MG PO CPEP: ORAL_CAPSULE | ORAL | 3 refills | Status: DC

## 2022-07-09 MED ORDER — ZOLPIDEM TARTRATE ER 12.5 MG PO TBCR: 12.5000 mg | EXTENDED_RELEASE_TABLET | Freq: Every evening | ORAL | 0 refills | Status: DC | PRN

## 2022-07-09 MED ORDER — MONTELUKAST SODIUM 10 MG PO TABS: 10.0000 mg | ORAL_TABLET | Freq: Every day | ORAL | 1 refills | Status: DC

## 2022-07-09 NOTE — Progress Notes (Unsigned)
[facility] [place], [place] [address]  Internal MEDICINE  Office Visit Note  Patient Name: Kathy Mccormick  [id number]  [id number]  Date of Service: 07/09/2022  Chief Complaint  Patient presents with   Follow-up   Hyperlipidemia   Hypertension   Gastroesophageal Reflux    HPI Kathy Mccormick presents for a follow-up visit for  Hypertension  Migraines      Current Medication: Outpatient Encounter Medications as of 07/09/2022  Medication Sig   CHERRY PO Take by mouth.   estradiol (ESTRACE) 0.1 MG/GM vaginal cream Place 1 Applicatorful vaginally 2 (two) times a week.   Multiple Vitamin (MULTIVITAMIN) tablet Take 1 tablet by mouth daily.   mupirocin ointment (BACTROBAN) 2 % Apply to wound once daily with bandage change   [DISCONTINUED] acyclovir (ZOVIRAX) 400 MG tablet TAKE 1 TABLET BY MOUTH TWICE  DAILY   [DISCONTINUED] ALPRAZolam (XANAX) 0.5 MG tablet Take 1 tablet (0.5 mg total) by mouth 2 (two) times daily as needed for anxiety. for anxiety   [DISCONTINUED] Azelaic Acid 15 % gel GENTLY BUT THOROUGHLY MASSAGE A  THIN FILM TO AFFECTED AREA(S)  TOPICALLY IN THE MORNING AND  EVENING AFTER SKIN IS THOROUGHLY WASHED AND PATTED DRY   [DISCONTINUED] busPIRone (BUSPAR) 5 MG tablet TAKE 1-2 TABLETS (5-10 MG TOTAL) BY MOUTH 2 (TWO) TIMES DAILY.   [DISCONTINUED] desonide (DESOWEN) 0.05 % cream APPLY 1 APPLICATION  TOPICALLY 3 TIMES DAILY AS  NEEDED   [DISCONTINUED] Diethylpropion HCl CR 75 MG TB24 Take 1 tablet (75 mg total) by mouth daily before breakfast.   [DISCONTINUED] doxycycline (VIBRA-TABS) 100 MG tablet TAKE 1 TABLET BY MOUTH DAILY   [DISCONTINUED] DULoxetine (CYMBALTA) 60 MG capsule TAKE 1 CAPSULE BY MOUTH 1 TO 2  TIMES DAILY   [DISCONTINUED] fluticasone (FLONASE) 50 MCG/ACT nasal spray Place 1 spray into both nostrils daily.   [DISCONTINUED] Galcanezumab-gnlm (EMGALITY) 120 MG/ML SOSY Inject 120 mg into the skin every 30 (thirty) days.   [DISCONTINUED]  levothyroxine (SYNTHROID) 50 MCG tablet TAKE 1 TABLET BY MOUTH DAILY  BEFORE BREAKFAST   [DISCONTINUED] losartan (COZAAR) 100 MG tablet TAKE 1 TABLET BY MOUTH DAILY   [DISCONTINUED] meloxicam (MOBIC) 15 MG tablet TAKE 1 TABLET BY MOUTH DAILY AS  NEEDED   [DISCONTINUED] montelukast (SINGULAIR) 10 MG tablet TAKE 1 TABLET BY MOUTH EVERYDAY AT BEDTIME   [DISCONTINUED] omeprazole (PRILOSEC) 40 MG capsule TAKE 1 CAPSULE BY MOUTH DAILY   [DISCONTINUED] rosuvastatin (CRESTOR) 10 MG tablet Take 1 tablet (10 mg total) by mouth daily.   [DISCONTINUED] Semaglutide-Weight Management 0.25 MG/0.5ML SOAJ Inject 0.25 mg into the skin once a week.   [DISCONTINUED] zolmitriptan (ZOMIG-ZMT) 5 MG disintegrating tablet TAKE 1 TABLET BY MOUTH AS NEEDED FOR MIGRAINES. MAY REPEAT AFTER 2-3 HRS. MAX DOSE 2 TABS IN 24 HRS   [DISCONTINUED] zolpidem (AMBIEN CR) 12.5 MG CR tablet TAKE 1 TABLET BY MOUTH AT  BEDTIME AS NEEDED FOR SLEEP   acyclovir (ZOVIRAX) 400 MG tablet Take 1 tablet (400 mg total) by mouth 2 (two) times daily.   ALPRAZolam (XANAX) 0.5 MG tablet Take 1 tablet (0.5 mg total) by mouth 2 (two) times daily as needed for anxiety. for anxiety   Azelaic Acid 15 % gel After skin is thoroughly washed and patted dry, gently but thoroughly massage a thin film of azelaic acid cream into the affected area twice daily, in the morning and evening.   busPIRone (BUSPAR) 5 MG tablet Take 1-2 tablets (5-10 mg total) by mouth 2 (two)  times daily.   desonide (DESOWEN) 0.05 % cream APPLY 1 APPLICATION  TOPICALLY 3 TIMES DAILY AS  NEEDED   Diethylpropion HCl CR 75 MG TB24 Take 1 tablet (75 mg total) by mouth daily before breakfast.   doxycycline (VIBRA-TABS) 100 MG tablet Take 1 tablet (100 mg total) by mouth daily.   DULoxetine (CYMBALTA) 60 MG capsule TAKE 1 CAPSULE BY MOUTH 1 TO 2  TIMES DAILY   fluticasone (FLONASE) 50 MCG/ACT nasal spray Place 1 spray into both nostrils daily.   Galcanezumab-gnlm (EMGALITY) 120 MG/ML SOSY Inject  120 mg into the skin every 30 (thirty) days.   levothyroxine (SYNTHROID) 50 MCG tablet Take 1 tablet (50 mcg total) by mouth daily before breakfast.   losartan (COZAAR) 100 MG tablet Take 1 tablet (100 mg total) by mouth daily.   meloxicam (MOBIC) 15 MG tablet Take 1 tablet (15 mg total) by mouth daily as needed.   montelukast (SINGULAIR) 10 MG tablet Take 1 tablet (10 mg total) by mouth at bedtime.   omeprazole (PRILOSEC) 40 MG capsule Take 1 capsule (40 mg total) by mouth daily.   rosuvastatin (CRESTOR) 10 MG tablet Take 1 tablet (10 mg total) by mouth daily.   zolmitriptan (ZOMIG-ZMT) 5 MG disintegrating tablet TAKE 1 TABLET BY MOUTH AS NEEDED FOR MIGRAINES. MAY REPEAT AFTER 2-3 HRS. MAX DOSE 2 TABS IN 24 HRS   zolpidem (AMBIEN CR) 12.5 MG CR tablet Take 1 tablet (12.5 mg total) by mouth at bedtime as needed. for sleep   No facility-administered encounter medications on file as of 07/09/2022.    Surgical History: Past Surgical History:  Procedure Laterality Date   BACK SURGERY     CARPAL TUNNEL RELEASE     CARPAL TUNNEL RELEASE Bilateral 2005   CHOLECYSTECTOMY     COLPOSCOPY     OOPHORECTOMY     RSO,LSO   PELVIC LAPAROSCOPY  2001,2004   DL X 2 RSO and LSO   TONSILLECTOMY     TRIGGER FINGER RELEASE Right 08/24/2021   VAGINAL HYSTERECTOMY  2000   endometriosis    Medical History: Past Medical History:  Diagnosis Date   Allergy    environmental   Anxiety    CIN I (cervical intraepithelial neoplasia I)    Endometriosis    GERD (gastroesophageal reflux disease)    History of actinic keratoses    Hyperlipidemia    Hypertension    Migraine    Migraines    Neck pain    STD (sexually transmitted disease)    HSV ll    Family History: Family History  Problem Relation Age of Onset   Hypertension Father    Diabetes Mother    Hypertension Mother    Uterine cancer Mother    Liver disease Mother    Hypertension Brother    Heart Problems Brother    Breast cancer Maternal  Aunt        Age 63's   Aneurysm Maternal Aunt     Social History   Socioeconomic History   Marital status: Married    Spouse name: Not on file   Number of children: Not on file   Years of education: Not on file   Highest education level: Not on file  Occupational History   Not on file  Tobacco Use   Smoking status: Former   Smokeless tobacco: Never  Vaping Use   Vaping Use: Never used  Substance and Sexual Activity   Alcohol use: No    Alcohol/week: 0.0  standard drinks of alcohol   Drug use: No   Sexual activity: Not Currently    Birth control/protection: Surgical    Comment: 1st intercourse 67 yo-More than 5 partners  Other Topics Concern   Not on file  Social History Narrative   Not on file   Social Determinants of Health   Financial Resource Strain: Not on file  Food Insecurity: Not on file  Transportation Needs: Not on file  Physical Activity: Not on file  Stress: Not on file  Social Connections: Not on file  Intimate Partner Violence: Not on file      Review of Systems  Vital Signs: BP 130/70 Comment: 166/98  Pulse 88   Temp (!) 97.1 F (36.2 C)   Resp 16   Ht '5\' 5"'$  (1.651 m)   Wt 162 lb 6.4 oz (73.7 kg)   SpO2 98%   BMI 27.02 kg/m    Physical Exam     Assessment/Plan:   General Counseling: Kathy Mccormick verbalizes understanding of the findings of todays visit and agrees with plan of treatment. I have discussed any further diagnostic evaluation that may be needed or ordered today. We also reviewed her medications today. she has been encouraged to call the office with any questions or concerns that should arise related to todays visit.    No orders of the defined types were placed in this encounter.   Meds ordered this encounter  Medications   ALPRAZolam (XANAX) 0.5 MG tablet    Sig: Take 1 tablet (0.5 mg total) by mouth 2 (two) times daily as needed for anxiety. for anxiety    Dispense:  60 tablet    Refill:  0   acyclovir (ZOVIRAX) 400 MG  tablet    Sig: Take 1 tablet (400 mg total) by mouth 2 (two) times daily.    Dispense:  180 tablet    Refill:  1   Azelaic Acid 15 % gel    Sig: After skin is thoroughly washed and patted dry, gently but thoroughly massage a thin film of azelaic acid cream into the affected area twice daily, in the morning and evening.    Dispense:  150 g    Refill:  1    Fill for 90 days   busPIRone (BUSPAR) 5 MG tablet    Sig: Take 1-2 tablets (5-10 mg total) by mouth 2 (two) times daily.    Dispense:  120 tablet    Refill:  0   desonide (DESOWEN) 0.05 % cream    Sig: APPLY 1 APPLICATION  TOPICALLY 3 TIMES DAILY AS  NEEDED    Dispense:  60 g    Refill:  2   Diethylpropion HCl CR 75 MG TB24    Sig: Take 1 tablet (75 mg total) by mouth daily before breakfast.    Dispense:  30 tablet    Refill:  0    Patient will bring good rx coupon, do not run through her insurance   doxycycline (VIBRA-TABS) 100 MG tablet    Sig: Take 1 tablet (100 mg total) by mouth daily.    Dispense:  90 tablet    Refill:  1   DULoxetine (CYMBALTA) 60 MG capsule    Sig: TAKE 1 CAPSULE BY MOUTH 1 TO 2  TIMES DAILY    Dispense:  180 capsule    Refill:  3    Requesting 1 year supply   fluticasone (FLONASE) 50 MCG/ACT nasal spray    Sig: Place 1 spray into both  nostrils daily.    Dispense:  48 g    Refill:  1   levothyroxine (SYNTHROID) 50 MCG tablet    Sig: Take 1 tablet (50 mcg total) by mouth daily before breakfast.    Dispense:  90 tablet    Refill:  3    Requesting 1 year supply   losartan (COZAAR) 100 MG tablet    Sig: Take 1 tablet (100 mg total) by mouth daily.    Dispense:  90 tablet    Refill:  3    Requesting 1 year supply   meloxicam (MOBIC) 15 MG tablet    Sig: Take 1 tablet (15 mg total) by mouth daily as needed.    Dispense:  90 tablet    Refill:  3    Requesting 1 year supply   montelukast (SINGULAIR) 10 MG tablet    Sig: Take 1 tablet (10 mg total) by mouth at bedtime.    Dispense:  90 tablet     Refill:  1   omeprazole (PRILOSEC) 40 MG capsule    Sig: Take 1 capsule (40 mg total) by mouth daily.    Dispense:  90 capsule    Refill:  3    Requesting 1 year supply   rosuvastatin (CRESTOR) 10 MG tablet    Sig: Take 1 tablet (10 mg total) by mouth daily.    Dispense:  90 tablet    Refill:  3   zolmitriptan (ZOMIG-ZMT) 5 MG disintegrating tablet    Sig: TAKE 1 TABLET BY MOUTH AS NEEDED FOR MIGRAINES. MAY REPEAT AFTER 2-3 HRS. MAX DOSE 2 TABS IN 24 HRS    Dispense:  10 tablet    Refill:  3   zolpidem (AMBIEN CR) 12.5 MG CR tablet    Sig: Take 1 tablet (12.5 mg total) by mouth at bedtime as needed. for sleep    Dispense:  90 tablet    Refill:  0   Galcanezumab-gnlm (EMGALITY) 120 MG/ML SOSY    Sig: Inject 120 mg into the skin every 30 (thirty) days.    Dispense:  1 mL    Refill:  5    Dx code K46.286    Return for F/U, Dunya Meiners PCP migraines in 1-2 months.   Total time spent:*** Minutes Time spent includes review of chart, medications, test results, and follow up plan with the patient.   [place] Controlled Substance Database was reviewed by me.  This patient was seen by Jonetta Osgood, FNP-C in collaboration with Dr. Clayborn Bigness as a part of collaborative care agreement.   Latonya Knight R. Valetta Fuller, MSN, FNP-C Internal medicine

## 2022-07-26 ENCOUNTER — Ambulatory Visit: Payer: 59 | Admitting: Dermatology

## 2022-07-26 DIAGNOSIS — D492 Neoplasm of unspecified behavior of bone, soft tissue, and skin: Secondary | ICD-10-CM

## 2022-07-26 DIAGNOSIS — L299 Pruritus, unspecified: Secondary | ICD-10-CM | POA: Diagnosis not present

## 2022-07-26 DIAGNOSIS — L719 Rosacea, unspecified: Secondary | ICD-10-CM

## 2022-07-26 DIAGNOSIS — L570 Actinic keratosis: Secondary | ICD-10-CM | POA: Diagnosis not present

## 2022-07-26 DIAGNOSIS — B353 Tinea pedis: Secondary | ICD-10-CM

## 2022-07-26 MED ORDER — TRIAMCINOLONE ACETONIDE 0.025 % EX OINT: TOPICAL_OINTMENT | CUTANEOUS | 0 refills | Status: AC

## 2022-07-26 MED ORDER — TRIAMCINOLONE ACETONIDE 0.1 % EX OINT: TOPICAL_OINTMENT | CUTANEOUS | 0 refills | Status: DC

## 2022-07-26 MED ORDER — FLUOROURACIL 5 % EX CREA: TOPICAL_CREAM | Freq: Two times a day (BID) | CUTANEOUS | 0 refills | Status: AC

## 2022-07-26 MED ORDER — CALCIPOTRIENE 0.005 % EX CREA: TOPICAL_CREAM | Freq: Two times a day (BID) | CUTANEOUS | 0 refills | Status: AC

## 2022-07-26 MED ORDER — CICLOPIROX OLAMINE 0.77 % EX CREA: TOPICAL_CREAM | Freq: Two times a day (BID) | CUTANEOUS | 5 refills | Status: DC

## 2022-07-26 MED ORDER — METRONIDAZOLE 0.75 % EX CREA: TOPICAL_CREAM | Freq: Two times a day (BID) | CUTANEOUS | 5 refills | Status: DC

## 2022-07-26 NOTE — Progress Notes (Signed)
Follow-Up Visit   Subjective  Kathy Mccormick is a 67 y.o. female who presents for the following: FBSE (Hx AK's) and Rosacea (Patient currently using azelaic acid but is having some break outs. ).  Soolantra was not covered by patient's insurance.   The following portions of the chart were reviewed this encounter and updated as appropriate:   Tobacco  Allergies  Meds  Problems  Med Hx  Surg Hx  Fam Hx      Review of Systems:  No other skin or systemic complaints except as noted in HPI or Assessment and Plan.  Objective  Well appearing patient in no apparent distress; mood and affect are within normal limits.  A full examination was performed including scalp, head, eyes, ears, nose, lips, neck, chest, axillae, abdomen, back, buttocks, bilateral upper extremities, bilateral lower extremities, hands, feet, fingers, toes, fingernails, and toenails. All findings within normal limits unless otherwise noted below.  face Mid face erythema, rare inflammatory papule  left lateral clavicle - A 0.7 cm pink papule R/o SCC vs BCC     Left anterior shoulder - B 1.2 cm scaly pink plaque R/o BCC vs SCCis  Left Forearm 1.2 cm pink plaque R/o BCC       left dorsal hand x 1, right medial pretibia x 1, right forearm x 2 (4) Erythematous thin papules/macules with gritty scale.   B/L feet Scaling and maceration web spaces and over distal and lateral soles.     Assessment & Plan  Rosacea face  Rosacea is a chronic progressive skin condition usually affecting the face of adults, causing redness and/or acne bumps. It is treatable but not curable. It sometimes affects the eyes (ocular rosacea) as well. It may respond to topical and/or systemic medication and can flare with stress, sun exposure, alcohol, exercise, topical steroids (including hydrocortisone/cortisone 10) and some foods.  Daily application of broad spectrum spf 30+ sunscreen to face is recommended to reduce  flares.  Can continue azelaic acid Start metronidazole 0.75% cream 2 times daily  metroNIDAZOLE (METROCREAM) 0.75 % cream - face Apply topically 2 (two) times daily.  Neoplasm of skin (3) left lateral clavicle - A  Skin / nail biopsy Type of biopsy: tangential   Informed consent: discussed and consent obtained   Timeout: patient name, date of birth, surgical site, and procedure verified   Procedure prep:  Patient was prepped and draped in usual sterile fashion Prep type:  Isopropyl alcohol Anesthesia: the lesion was anesthetized in a standard fashion   Anesthetic:  1% lidocaine w/ epinephrine 1-100,000 buffered w/ 8.4% NaHCO3 Instrument used: flexible razor blade   Hemostasis achieved with: aluminum chloride   Outcome: patient tolerated procedure well   Post-procedure details: wound care instructions given   Additional details:  Petrolatum and a pressure bandage applied  Specimen 1 - Surgical pathology Differential Diagnosis: R/o SCC vs BCC  Check Margins: No 0.7 cm pink papule   Left anterior shoulder - B  Skin / nail biopsy Type of biopsy: tangential   Informed consent: discussed and consent obtained   Timeout: patient name, date of birth, surgical site, and procedure verified   Procedure prep:  Patient was prepped and draped in usual sterile fashion Prep type:  Isopropyl alcohol Anesthesia: the lesion was anesthetized in a standard fashion   Anesthetic:  1% lidocaine w/ epinephrine 1-100,000 buffered w/ 8.4% NaHCO3 Instrument used: flexible razor blade   Hemostasis achieved with: aluminum chloride   Outcome: patient tolerated procedure well  Post-procedure details: wound care instructions given   Additional details:  Petrolatum and a pressure bandage applied  Specimen 2 - Surgical pathology Differential Diagnosis: R/o BCC vs SCCis  Check Margins: No 1.2 cm scaly pink plaque   Left Forearm  Skin / nail biopsy Type of biopsy: tangential   Informed consent:  discussed and consent obtained   Timeout: patient name, date of birth, surgical site, and procedure verified   Procedure prep:  Patient was prepped and draped in usual sterile fashion Prep type:  Isopropyl alcohol Anesthesia: the lesion was anesthetized in a standard fashion   Anesthetic:  1% lidocaine w/ epinephrine 1-100,000 buffered w/ 8.4% NaHCO3 Instrument used: flexible razor blade   Hemostasis achieved with: aluminum chloride   Outcome: patient tolerated procedure well   Post-procedure details: wound care instructions given   Additional details:  Petrolatum and a pressure bandage applied  Specimen 3 - Surgical pathology Differential Diagnosis: R/o BCC  Check Margins: No 1.2 cm pink plaque   Related Procedures Anatomic Pathology Report  Related Medications mupirocin ointment (BACTROBAN) 2 % Apply to wound once daily with bandage change  AK (actinic keratosis) (4) left dorsal hand x 1, right medial pretibia x 1, right forearm x 2  Actinic keratoses are precancerous spots that appear secondary to cumulative UV radiation exposure/sun exposure over time. They are chronic with expected duration over 1 year. A portion of actinic keratoses will progress to squamous cell carcinoma of the skin. It is not possible to reliably predict which spots will progress to skin cancer and so treatment is recommended to prevent development of skin cancer.  Recommend daily broad spectrum sunscreen SPF 30+ to sun-exposed areas, reapply every 2 hours as needed.  Recommend staying in the shade or wearing long sleeves, sun glasses (UVA+UVB protection) and wide brim hats (4-inch brim around the entire circumference of the hat). Call for new or changing lesions.  Prior to procedure, discussed risks of blister formation, small wound, skin dyspigmentation, or rare scar following cryotherapy. Recommend Vaseline ointment to treated areas while healing.  Hypertrophic   Destruction of lesion - left dorsal  hand x 1, right medial pretibia x 1, right forearm x 2  Destruction method: cryotherapy   Informed consent: discussed and consent obtained   Lesion destroyed using liquid nitrogen: Yes   Cryotherapy cycles:  2 Outcome: patient tolerated procedure well with no complications   Post-procedure details: wound care instructions given    Related Medications calcipotriene (DOVONOX) 0.005 % cream Apply topically 2 (two) times daily. As directed  fluorouracil (EFUDEX) 5 % cream Apply topically 2 (two) times daily. As directed  Tinea pedis of both feet B/L feet  Start ciclopirox cream to both feet, in between toes twice daily  ciclopirox (LOPROX) 0.77 % cream - B/L feet Apply topically 2 (two) times daily.  Itch Mid Back  Recommend OTC Gold Bond Rapid Relief Anti-Itch cream (pramoxine + menthol), CeraVe Anti-itch cream or lotion (pramoxine), Sarna lotion (Original- menthol + camphor or Sensitive- pramoxine) or Eucerin 12 hour Itch Relief lotion (menthol) up to 3 times per day to areas on body that are itchy.    Lentigines - Scattered tan macules - Due to sun exposure - Benign-appearing, observe - Recommend daily broad spectrum sunscreen SPF 30+ to sun-exposed areas, reapply every 2 hours as needed. - Call for any changes  Seborrheic Keratoses - Stuck-on, waxy, tan-brown papules and/or plaques  - Benign-appearing - Discussed benign etiology and prognosis. - Observe - Call for any  changes  Melanocytic Nevi - Tan-brown and/or pink-flesh-colored symmetric macules and papules - Benign appearing on exam today - Observation - Call clinic for new or changing moles - Recommend daily use of broad spectrum spf 30+ sunscreen to sun-exposed areas.   Hemangiomas - Red papules - Discussed benign nature - Observe - Call for any changes  Actinic Damage - Chronic condition, secondary to cumulative UV/sun exposure - diffuse scaly erythematous macules with underlying dyspigmentation -  Recommend daily broad spectrum sunscreen SPF 30+ to sun-exposed areas, reapply every 2 hours as needed.  - Staying in the shade or wearing long sleeves, sun glasses (UVA+UVB protection) and wide brim hats (4-inch brim around the entire circumference of the hat) are also recommended for sun protection.  - Call for new or changing lesions.  Skin cancer screening performed today.  Actinic Damage with PreCancerous Actinic Keratoses Counseling for Topical Chemotherapy Management: Patient exhibits: - Severe, confluent actinic changes with pre-cancerous actinic keratoses that is secondary to cumulative UV radiation exposure over time - Condition that is severe; chronic, not at goal. - diffuse scaly erythematous macules and papules with underlying dyspigmentation - Discussed Prescription "Field Treatment" topical Chemotherapy for Severe, Chronic Confluent Actinic Changes with Pre-Cancerous Actinic Keratoses Field treatment involves treatment of an entire area of skin that has confluent Actinic Changes (Sun/ Ultraviolet light damage) and PreCancerous Actinic Keratoses by method of PhotoDynamic Therapy (PDT) and/or prescription Topical Chemotherapy agents such as 5-fluorouracil, 5-fluorouracil/calcipotriene, and/or imiquimod.  The purpose is to decrease the number of clinically evident and subclinical PreCancerous lesions to prevent progression to development of skin cancer by chemically destroying early precancer changes that may or may not be visible.  It has been shown to reduce the risk of developing skin cancer in the treated area. As a result of treatment, redness, scaling, crusting, and open sores may occur during treatment course. One or more than one of these methods may be used and may have to be used several times to control, suppress and eliminate the PreCancerous changes. Discussed treatment course, expected reaction, and possible side effects. - Recommend daily broad spectrum sunscreen SPF 30+ to  sun-exposed areas, reapply every 2 hours as needed.  - Staying in the shade or wearing long sleeves, sun glasses (UVA+UVB protection) and wide brim hats (4-inch brim around the entire circumference of the hat) are also recommended. - Call for new or changing lesions. - Start 5-fluorouracil cream followed by calcipotriene twice a day for 4 days to affected areas including face, twice daily for 7 days to chest.  Reviewed course of treatment and expected reaction.  Patient advised to expect inflammation and crusting and advised that erosions are possible.  Patient advised to be diligent with sun protection during and after treatment. Handout with details of how to apply medication and what to expect provided. Counseled to keep medication out of reach of children and pets. - Reviewed course of treatment and expected reaction.  Patient advised to expect inflammation and crusting and advised that erosions are possible.  Patient advised to be diligent with sun protection during and after treatment. Handout with details of how to apply medication and what to expect provided. Counseled to keep medication out of reach of children and pets. - Start "cool down" ointment twice a day up to 1 week for irritation- TMC 0.025% ointment to face and 0.1% ointment to body after completing treatment with fluorouracil  Return in about 2 months (around 09/24/2022).  Graciella Belton, RMA, am acting as scribe for  Forest Gleason, MD .  Documentation: I have reviewed the above documentation for accuracy and completeness, and I agree with the above.  Forest Gleason, MD

## 2022-07-26 NOTE — Patient Instructions (Addendum)
- Start 5-fluorouracil cream followed by calcipotriene twice a day for 4 days to affected areas including face, twice daily for 7 days to chest.  Reviewed course of treatment and expected reaction.  Patient advised to expect inflammation and crusting and advised that erosions are possible.  Patient advised to be diligent with sun protection during and after treatment. Handout with details of how to apply medication and what to expect provided. Counseled to keep medication out of reach of children and pets.  5-Fluorouracil/Calcipotriene Patient Education   Actinic keratoses are the dry, red scaly spots on the skin caused by sun damage. A portion of these spots can turn into skin cancer with time, and treating them can help prevent development of skin cancer.   Treatment of these spots requires removal of the defective skin cells. There are various ways to remove actinic keratoses, including freezing with liquid nitrogen, treatment with creams, or treatment with a blue light procedure in the office.   5-fluorouracil cream is a topical cream used to treat actinic keratoses. It works by interfering with the growth of abnormal fast-growing skin cells, such as actinic keratoses. These cells peel off and are replaced by healthy ones.   5-fluorouracil/calcipotriene is a combination of the 5-fluorouracil cream with a vitamin D analog cream called calcipotriene. The calcipotriene alone does not treat actinic keratoses. However, when it is combined with 5-fluorouracil, it helps the 5-fluorouracil treat the actinic keratoses much faster so that the same results can be achieved with a much shorter treatment time.  INSTRUCTIONS FOR 5-FLUOROURACIL/CALCIPOTRIENE CREAM:   5-fluorouracil/calcipotriene cream typically only needs to be used for 4-7 days. A thin layer should be applied twice a day to the treatment areas recommended by your physician.   If your physician prescribed you separate tubes of 5-fluourouracil  and calcipotriene, apply a thin layer of 5-fluorouracil followed by a thin layer of calcipotriene.   Avoid contact with your eyes, nostrils, and mouth. Do not use 5-fluorouracil/calcipotriene cream on infected or open wounds.   You will develop redness, irritation and some crusting at areas where you have pre-cancer damage/actinic keratoses. IF YOU DEVELOP PAIN, BLEEDING, OR SIGNIFICANT CRUSTING, STOP THE TREATMENT EARLY - you have already gotten a good response and the actinic keratoses should clear up well.  Wash your hands after applying 5-fluorouracil 5% cream on your skin.   A moisturizer or sunscreen with a minimum SPF 30 should be applied each morning.   Once you have finished the treatment, you can apply a thin layer of Vaseline twice a day to irritated areas to soothe and calm the areas more quickly. If you experience significant discomfort, contact your physician.  For some patients it is necessary to repeat the treatment for best results.  SIDE EFFECTS: When using 5-fluorouracil/calcipotriene cream, you may have mild irritation, such as redness, dryness, swelling, or a mild burning sensation. This usually resolves within 2 weeks. The more actinic keratoses you have, the more redness and inflammation you can expect during treatment. Eye irritation has been reported rarely. If this occurs, please let us know.  If you have any trouble using this cream, please call the office. If you have any other questions about this information, please do not hesitate to ask me before you leave the office.      Recommend OTC Gold Bond Rapid Relief Anti-Itch cream (pramoxine + menthol), CeraVe Anti-itch cream or lotion (pramoxine), Sarna lotion (Original- menthol + camphor or Sensitive- pramoxine) or Eucerin 12 hour Itch Relief lotion (menthol)  up to 3 times per day to areas on body that are itchy.     Wound Care Instructions  Cleanse wound gently with soap and water once a day then pat dry with  clean gauze. Apply a thin coat of Petrolatum (petroleum jelly, "Vaseline") over the wound (unless you have an allergy to this). We recommend that you use a new, sterile tube of Vaseline. Do not pick or remove scabs. Do not remove the yellow or white "healing tissue" from the base of the wound.  Cover the wound with fresh, clean, nonstick gauze and secure with paper tape. You may use Band-Aids in place of gauze and tape if the wound is small enough, but would recommend trimming much of the tape off as there is often too much. Sometimes Band-Aids can irritate the skin.  You should call the office for your biopsy report after 1 week if you have not already been contacted.  If you experience any problems, such as abnormal amounts of bleeding, swelling, significant bruising, significant pain, or evidence of infection, please call the office immediately.  FOR ADULT SURGERY PATIENTS: If you need something for pain relief you may take 1 extra strength Tylenol (acetaminophen) AND 2 Ibuprofen ('200mg'$  each) together every 4 hours as needed for pain. (do not take these if you are allergic to them or if you have a reason you should not take them.) Typically, you may only need pain medication for 1 to 3 days.   Cryotherapy Aftercare  Wash gently with soap and water everyday.   Apply Vaseline and Band-Aid daily until healed.  Due to recent changes in healthcare laws, you may see results of your pathology and/or laboratory studies on MyChart before the doctors have had a chance to review them. We understand that in some cases there may be results that are confusing or concerning to you. Please understand that not all results are received at the same time and often the doctors may need to interpret multiple results in order to provide you with the best plan of care or course of treatment. Therefore, we ask that you please give [place] 2 business days to thoroughly review all your results before contacting the office for  clarification. Should we see a critical lab result, you will be contacted sooner.   If You Need Anything After Your Visit  If you have any questions or concerns for your doctor, please call our main line at [phone] and press option 4 to reach your doctor's medical assistant. If no one answers, please leave a voicemail as directed and we will return your call as soon as possible. Messages left after 4 pm will be answered the following business day.   You may also send [place] a message via [place]. We typically respond to MyChart messages within 1-2 business days.  For prescription refills, please ask your pharmacy to contact our office. Our fax number is [phone].  If you have an urgent issue when the clinic is closed that cannot wait until the next business day, you can page your doctor at the number below.    Please note that while we do our best to be available for urgent issues outside of office hours, we are not available 24/7.   If you have an urgent issue and are unable to reach [place], you may choose to seek medical care at your doctor's office, retail clinic, urgent care center, or emergency room.  If you have a medical emergency, please immediately call 911 or  go to the emergency department.  Pager Numbers  - Dr. Nehemiah Massed: [phone]  - Dr. Laurence Ferrari: [phone]  - Dr. Nicole Kindred: [phone]  In the event of inclement weather, please call our main line at [phone] for an update on the status of any delays or closures.  Dermatology Medication Tips: Please keep the boxes that topical medications come in in order to help keep track of the instructions about where and how to use these. Pharmacies typically print the medication instructions only on the boxes and not directly on the medication tubes.   If your medication is too expensive, please contact our office at [phone] option 4 or send [place] a message through [place].   We are unable to tell what your co-pay for  medications will be in advance as this is different depending on your insurance coverage. However, we may be able to find a substitute medication at lower cost or fill out paperwork to get insurance to cover a needed medication.   If a prior authorization is required to get your medication covered by your insurance company, please allow [place] 1-2 business days to complete this process.  Drug prices often vary depending on where the prescription is filled and some pharmacies may offer cheaper prices.  The website www.goodrx.com contains coupons for medications through different pharmacies. The prices here do not account for what the cost may be with help from insurance (it may be cheaper with your insurance), but the website can give you the price if you did not use any insurance.  - You can print the associated coupon and take it with your prescription to the pharmacy.  - You may also stop by our office during regular business hours and pick up a GoodRx coupon card.  - If you need your prescription sent electronically to a different pharmacy, notify our office through [facility] or by phone at [phone] option 4.     Si Usted Necesita Algo Despus de Su Visita  Tambin puede enviarnos un mensaje a travs de Pharmacist, community. Por lo general respondemos a los mensajes de MyChart en el transcurso de 1 a 2 das hbiles.  Para renovar recetas, por favor pida a su farmacia que se ponga en contacto con nuestra oficina. Harland Dingwall de fax es [place] [phone].  Si tiene un asunto urgente cuando la clnica est cerrada y que no puede esperar hasta el siguiente da hbil, puede llamar/localizar a su doctor(a) al nmero que aparece a continuacin.   Por favor, tenga en cuenta que aunque hacemos todo lo posible para estar disponibles para asuntos urgentes fuera del horario de [place], no estamos disponibles las 24 horas del da, los 7 das de la [place].   Si tiene un problema urgente y no puede  comunicarse con nosotros, puede optar por buscar atencin mdica  en el consultorio de su doctor(a), en una clnica privada, en un centro de atencin urgente o en una sala de emergencias.  Si tiene Engineering geologist, por favor llame inmediatamente al 911 o vaya a la sala de emergencias.  Nmeros de bper  - Dr. Nehemiah Massed: [phone]  - Dra. Moye: [phone]  - Dra. Nicole Kindred: [phone]  En caso de inclemencias del [place], por favor llame a Johnsie Kindred principal al [phone] para una actualizacin sobre el [place] de cualquier retraso o cierre.  Consejos para la medicacin en dermatologa: Por favor, guarde las cajas en las que vienen los medicamentos de uso tpico para ayudarle a seguir las instrucciones sobre dnde y  cmo usarlos. Las farmacias generalmente imprimen las instrucciones del medicamento slo en las cajas y no directamente en los tubos del [place].   Si su medicamento es muy caro, por favor, pngase en contacto con Zigmund Daniel llamando al [phone] y presione la opcin 4 o envenos un mensaje a travs de Pharmacist, community.   No podemos decirle cul ser su copago por los medicamentos por adelantado ya que esto es diferente dependiendo de la cobertura de su seguro. Sin embargo, es posible que podamos encontrar un medicamento sustituto a Electrical engineer un formulario para que el seguro cubra el medicamento que se considera necesario.   Si se requiere una autorizacin previa para que su compaa de seguros [place] su medicamento, por favor permtanos de 1 a 2 das hbiles para completar este proceso.  Los precios de los medicamentos varan con frecuencia dependiendo del Environmental consultant de dnde se surte la receta y alguna farmacias pueden ofrecer precios ms baratos.  El sitio web www.goodrx.com tiene cupones para medicamentos de Airline pilot. Los precios aqu no tienen en cuenta lo que podra costar con la ayuda del seguro (puede ser ms barato con su seguro), pero  el sitio web puede darle el precio si no utiliz Research scientist (physical sciences).  - Puede imprimir el cupn correspondiente y llevarlo con su receta a la farmacia.  - Tambin puede pasar por nuestra oficina durante el horario de atencin regular y Charity fundraiser una tarjeta de cupones de GoodRx.  - Si necesita que su receta se enve electrnicamente a una farmacia diferente, informe a nuestra oficina a travs de MyChart de [place] o por telfono llamando al [phone] y presione la opcin 4.

## 2022-07-30 ENCOUNTER — Encounter: Payer: Self-pay | Admitting: Dermatology

## 2022-08-01 ENCOUNTER — Telehealth: Payer: Self-pay

## 2022-08-01 LAB — ANATOMIC PATHOLOGY REPORT

## 2022-08-01 NOTE — Telephone Encounter (Addendum)
  Tried calling patient regarding results. No answer. LMOM for patient to call office.    ----- Message from Alfonso Patten, MD sent at 08/01/2022  2:42 PM EST ----- Diagnosis synopsis: Comment Comment: Part A-left lateral clavicle ,Skin Biopsy: HYPERPLASTIC ACTINIC KERATOSIS. --> LN2  Part B-left anterior shoulder ,Skin Biopsy: ACTINIC KERATOSIS. --> LN2  Part C-left forearm ,Skin Biopsy: HYPERPLASTIC ACTINIC KERATOSIS. --> LN2  MAs please call. Suggest treating in next month or so and we can recheck to be sure they are clear at her f/u in march. Thank you!

## 2022-08-02 ENCOUNTER — Telehealth: Payer: Self-pay

## 2022-08-02 NOTE — Telephone Encounter (Signed)
-----  Message from [place], MD sent at 08/01/2022  2:42 PM EST ----- Diagnosis synopsis: Comment Comment: Part A-left lateral clavicle ,Skin Biopsy: HYPERPLASTIC ACTINIC KERATOSIS. --> LN2  Part B-left anterior shoulder ,Skin Biopsy: ACTINIC KERATOSIS. --> LN2  Part C-left forearm ,Skin Biopsy: HYPERPLASTIC ACTINIC KERATOSIS. --> LN2  MAs please call. Suggest treating in next month or so and we can recheck to be sure they are clear at her f/u in march. Thank you!

## 2022-08-02 NOTE — Telephone Encounter (Signed)
Patient advised and scheduled. aw

## 2022-08-08 ENCOUNTER — Encounter: Payer: Self-pay | Admitting: Dermatology

## 2022-08-08 ENCOUNTER — Ambulatory Visit: Payer: 59 | Admitting: Dermatology

## 2022-08-08 VITALS — BP 150/90 | HR 85

## 2022-08-08 DIAGNOSIS — L719 Rosacea, unspecified: Secondary | ICD-10-CM | POA: Diagnosis not present

## 2022-08-08 DIAGNOSIS — L570 Actinic keratosis: Secondary | ICD-10-CM | POA: Diagnosis not present

## 2022-08-08 MED ORDER — METRONIDAZOLE 0.75 % EX CREA: TOPICAL_CREAM | Freq: Two times a day (BID) | CUTANEOUS | 5 refills | Status: AC

## 2022-08-08 NOTE — Patient Instructions (Signed)
Cryotherapy Aftercare  Wash gently with soap and water everyday.   Apply Vaseline and Band-Aid daily until healed.     Due to recent changes in healthcare laws, you may see results of your pathology and/or laboratory studies on MyChart before the doctors have had a chance to review them. We understand that in some cases there may be results that are confusing or concerning to you. Please understand that not all results are received at the same time and often the doctors may need to interpret multiple results in order to provide you with the best plan of care or course of treatment. Therefore, we ask that you please give us 2 business days to thoroughly review all your results before contacting the office for clarification. Should we see a critical lab result, you will be contacted sooner.   If You Need Anything After Your Visit  If you have any questions or concerns for your doctor, please call our main line at [phone] and press option 4 to reach your doctor's medical assistant. If no one answers, please leave a voicemail as directed and we will return your call as soon as possible. Messages left after 4 pm will be answered the following business day.   You may also send us a message via MyChart. We typically respond to MyChart messages within 1-2 business days.  For prescription refills, please ask your pharmacy to contact our office. Our fax number is [phone].  If you have an urgent issue when the clinic is closed that cannot wait until the next business day, you can page your doctor at the number below.    Please note that while we do our best to be available for urgent issues outside of office hours, we are not available 24/7.   If you have an urgent issue and are unable to reach us, you may choose to seek medical care at your doctor's office, retail clinic, urgent care center, or emergency room.  If you have a medical emergency, please immediately call 911 or go to the  emergency department.  Pager Numbers  - Dr. Kowalski: [phone]  - Dr. Moye: [phone]  - Dr. Stewart: [phone]  In the event of inclement weather, please call our main line at [phone] for an update on the status of any delays or closures.  Dermatology Medication Tips: Please keep the boxes that topical medications come in in order to help keep track of the instructions about where and how to use these. Pharmacies typically print the medication instructions only on the boxes and not directly on the medication tubes.   If your medication is too expensive, please contact our office at [phone] option 4 or send us a message through MyChart.   We are unable to tell what your co-pay for medications will be in advance as this is different depending on your insurance coverage. However, we may be able to find a substitute medication at lower cost or fill out paperwork to get insurance to cover a needed medication.   If a prior authorization is required to get your medication covered by your insurance company, please allow us 1-2 business days to complete this process.  Drug prices often vary depending on where the prescription is filled and some pharmacies may offer cheaper prices.  The website www.goodrx.com contains coupons for medications through different pharmacies. The prices here do not account for what the cost may be with help from insurance (it may be cheaper with your insurance), but the website can   give you the price if you did not use any insurance.  - You can print the associated coupon and take it with your prescription to the pharmacy.  - You may also stop by our office during regular business hours and pick up a GoodRx coupon card.  - If you need your prescription sent electronically to a different pharmacy, notify our office through [facility] MyChart or by phone at [phone] option 4.     Si Usted Necesita Algo Despus de Su Visita  Tambin puede  enviarnos un mensaje a travs de MyChart. Por lo general respondemos a los mensajes de MyChart en el transcurso de 1 a 2 das hbiles.  Para renovar recetas, por favor pida a su farmacia que se ponga en contacto con nuestra oficina. Nuestro nmero de fax es el [phone].  Si tiene un asunto urgente cuando la clnica est cerrada y que no puede esperar hasta el siguiente da hbil, puede llamar/localizar a su doctor(a) al nmero que aparece a continuacin.   Por favor, tenga en cuenta que aunque hacemos todo lo posible para estar disponibles para asuntos urgentes fuera del horario de oficina, no estamos disponibles las 24 horas del da, los 7 das de la semana.   Si tiene un problema urgente y no puede comunicarse con nosotros, puede optar por buscar atencin mdica  en el consultorio de su doctor(a), en una clnica privada, en un centro de atencin urgente o en una sala de emergencias.  Si tiene una emergencia mdica, por favor llame inmediatamente al 911 o vaya a la sala de emergencias.  Nmeros de bper  - Dr. Kowalski: [phone]  - Dra. Moye: [phone]  - Dra. Stewart: [phone]  En caso de inclemencias del tiempo, por favor llame a nuestra lnea principal al [phone] para una actualizacin sobre el estado de cualquier retraso o cierre.  Consejos para la medicacin en dermatologa: Por favor, guarde las cajas en las que vienen los medicamentos de uso tpico para ayudarle a seguir las instrucciones sobre dnde y cmo usarlos. Las farmacias generalmente imprimen las instrucciones del medicamento slo en las cajas y no directamente en los tubos del medicamento.   Si su medicamento es muy caro, por favor, pngase en contacto con nuestra oficina llamando al [phone] y presione la opcin 4 o envenos un mensaje a travs de MyChart.   No podemos decirle cul ser su copago por los medicamentos por adelantado ya que esto es diferente dependiendo de la cobertura de su seguro.  Sin embargo, es posible que podamos encontrar un medicamento sustituto a menor costo o llenar un formulario para que el seguro cubra el medicamento que se considera necesario.   Si se requiere una autorizacin previa para que su compaa de seguros cubra su medicamento, por favor permtanos de 1 a 2 das hbiles para completar este proceso.  Los precios de los medicamentos varan con frecuencia dependiendo del lugar de dnde se surte la receta y alguna farmacias pueden ofrecer precios ms baratos.  El sitio web www.goodrx.com tiene cupones para medicamentos de diferentes farmacias. Los precios aqu no tienen en cuenta lo que podra costar con la ayuda del seguro (puede ser ms barato con su seguro), pero el sitio web puede darle el precio si no utiliz ningn seguro.  - Puede imprimir el cupn correspondiente y llevarlo con su receta a la farmacia.  - Tambin puede pasar por nuestra oficina durante el horario de atencin regular y recoger una tarjeta de cupones de GoodRx.  -   Si necesita que su receta se enve electrnicamente a una farmacia diferente, informe a nuestra oficina a travs de MyChart de [facility] o por telfono llamando al [phone] y presione la opcin 4.  

## 2022-08-08 NOTE — Progress Notes (Signed)
Follow-Up Visit   Subjective  Kathy Mccormick is a 67 y.o. female who presents for the following: Actinic Keratosis (Here for LN2 treatment. Bx proven. 07/26/2022).  The patient has spots, moles and lesions to be evaluated, some may be new or changing and the patient has concerns that these could be cancer.  The following portions of the chart were reviewed this encounter and updated as appropriate:  Tobacco  Allergies  Meds  Problems  Med Hx  Surg Hx  Fam Hx      Review of Systems: No other skin or systemic complaints except as noted in HPI or Assessment and Plan.   Objective  Well appearing patient in no apparent distress; mood and affect are within normal limits.  A focused examination was performed including left chest, left arm. Relevant physical exam findings are noted in the Assessment and Plan.  Left Shoulder - Anterior x1, left lateral clavicle x1, left forearm x1 (3) Erythematous thin papules/macules with gritty scale.   Head - Anterior (Face) Mid face erythema with telangiectasias    Assessment & Plan  AK (actinic keratosis) (3) Left Shoulder - Anterior x1, left lateral clavicle x1, left forearm x1  Actinic keratoses are precancerous spots that appear secondary to cumulative UV radiation exposure/sun exposure over time. They are chronic with expected duration over 1 year. A portion of actinic keratoses will progress to squamous cell carcinoma of the skin. It is not possible to reliably predict which spots will progress to skin cancer and so treatment is recommended to prevent development of skin cancer.  Recommend daily broad spectrum sunscreen SPF 30+ to sun-exposed areas, reapply every 2 hours as needed.  Recommend staying in the shade or wearing long sleeves, sun glasses (UVA+UVB protection) and wide brim hats (4-inch brim around the entire circumference of the hat). Call for new or changing lesions.  Destruction of lesion - Left Shoulder - Anterior x1, left  lateral clavicle x1, left forearm x1  Destruction method: cryotherapy   Informed consent: discussed and consent obtained   Lesion destroyed using liquid nitrogen: Yes   Region frozen until ice ball extended beyond lesion: Yes   Outcome: patient tolerated procedure well with no complications   Post-procedure details: wound care instructions given   Additional details:  Prior to procedure, discussed risks of blister formation, small wound, skin dyspigmentation, or rare scar following cryotherapy. Recommend Vaseline ointment to treated areas while healing.   Related Medications calcipotriene (DOVONOX) 0.005 % cream Apply topically 2 (two) times daily. As directed  fluorouracil (EFUDEX) 5 % cream Apply topically 2 (two) times daily. As directed  Rosacea Head - Anterior (Face)  Chronic condition with duration or expected duration over one year. Currently well-controlled.  Rosacea is a chronic progressive skin condition usually affecting the face of adults, causing redness and/or acne bumps. It is treatable but not curable. It sometimes affects the eyes (ocular rosacea) as well. It may respond to topical and/or systemic medication and can flare with stress, sun exposure, alcohol, exercise, topical steroids (including hydrocortisone/cortisone 10) and some foods.  Daily application of broad spectrum spf 30+ sunscreen to face is recommended to reduce flares.  Continue Metronidazole cream twice daily as directed.   Related Medications metroNIDAZOLE (METROCREAM) 0.75 % cream Apply topically 2 (two) times daily.   Return for Follow Up As Scheduled.  I, Emelia Salisbury, CMA, am acting as scribe for Forest Gleason, MD.  Documentation: I have reviewed the above documentation for accuracy and completeness, and I  agree with the above.  Forest Gleason, MD

## 2022-08-16 ENCOUNTER — Telehealth: Payer: Self-pay | Admitting: Nurse Practitioner

## 2022-08-16 NOTE — Telephone Encounter (Signed)
MR & itemized billing statement faxed to Kathy Mccormick again (33 pages) [phone]

## 2022-08-20 ENCOUNTER — Ambulatory Visit: Payer: 59 | Admitting: Nurse Practitioner

## 2022-08-21 ENCOUNTER — Encounter: Payer: Self-pay | Admitting: Dermatology

## 2022-08-21 ENCOUNTER — Ambulatory Visit: Payer: 59 | Admitting: Nurse Practitioner

## 2022-08-21 ENCOUNTER — Encounter: Payer: Self-pay | Admitting: Nurse Practitioner

## 2022-08-21 VITALS — BP 131/89 | HR 82 | Temp 97.9°F | Resp 16 | Ht 65.0 in | Wt 165.0 lb

## 2022-08-21 DIAGNOSIS — G43009 Migraine without aura, not intractable, without status migrainosus: Secondary | ICD-10-CM

## 2022-08-21 DIAGNOSIS — B3731 Acute candidiasis of vulva and vagina: Secondary | ICD-10-CM

## 2022-08-21 DIAGNOSIS — Z6827 Body mass index (BMI) 27.0-27.9, adult: Secondary | ICD-10-CM

## 2022-08-21 DIAGNOSIS — Z760 Encounter for issue of repeat prescription: Secondary | ICD-10-CM

## 2022-08-21 MED ORDER — DIETHYLPROPION HCL ER 75 MG PO TB24: 75.0000 mg | ORAL_TABLET | Freq: Every day | ORAL | 0 refills | Status: DC

## 2022-08-21 MED ORDER — FLUCONAZOLE 150 MG PO TABS: 150.0000 mg | ORAL_TABLET | Freq: Once | ORAL | 0 refills | Status: AC

## 2022-08-21 MED ORDER — AIMOVIG 140 MG/ML ~~LOC~~ SOAJ: 140.0000 mg | SUBCUTANEOUS | 5 refills | Status: DC

## 2022-08-21 NOTE — Progress Notes (Signed)
[facility] [place], [place] [address]  Internal MEDICINE  Office Visit Note  Patient Name: Kathy Mccormick  [id number]  [id number]  Date of Service: 08/21/2022  Chief Complaint  Patient presents with  . Follow-up  . Gastroesophageal Reflux  . Hypertension  . Hyperlipidemia  . Migraine    HPI Kathy Mccormick presents for a follow-up visit for migraines, weight loss, and yeast infection.  Migraine -- insurance will not pay for emgality. But will pay for aimovig Lost weight but did not take med for the last week.  Yeast infection -- fluconazole   Current Medication: Outpatient Encounter Medications as of 08/21/2022  Medication Sig  . acyclovir (ZOVIRAX) 400 MG tablet Take 1 tablet (400 mg total) by mouth 2 (two) times daily.  Marland Kitchen ALPRAZolam (XANAX) 0.5 MG tablet Take 1 tablet (0.5 mg total) by mouth 2 (two) times daily as needed for anxiety. for anxiety  . Azelaic Acid 15 % gel After skin is thoroughly washed and patted dry, gently but thoroughly massage a thin film of azelaic acid cream into the affected area twice daily, in the morning and evening.  . busPIRone (BUSPAR) 5 MG tablet Take 1-2 tablets (5-10 mg total) by mouth 2 (two) times daily.  . calcipotriene (DOVONOX) 0.005 % cream Apply topically 2 (two) times daily. As directed  . CHERRY PO Take by mouth.  . ciclopirox (LOPROX) 0.77 % cream Apply topically 2 (two) times daily.  Marland Kitchen desonide (DESOWEN) 0.05 % cream APPLY 1 APPLICATION  TOPICALLY 3 TIMES DAILY AS  NEEDED  . doxycycline (VIBRA-TABS) 100 MG tablet Take 1 tablet (100 mg total) by mouth daily.  . DULoxetine (CYMBALTA) 60 MG capsule TAKE 1 CAPSULE BY MOUTH 1 TO 2  TIMES DAILY  . Erenumab-aooe (AIMOVIG) 140 MG/ML SOAJ Inject 140 mg into the skin every 30 (thirty) days.  Marland Kitchen estradiol (ESTRACE) 0.1 MG/GM vaginal cream Place 1 Applicatorful vaginally 2 (two) times a week.  . fluconazole (DIFLUCAN) 150 MG tablet Take 1 tablet (150 mg total) by mouth once for 1  dose. May take an additional dose after 3 days if still symptomatic.  . fluorouracil (EFUDEX) 5 % cream Apply topically 2 (two) times daily. As directed  . fluticasone (FLONASE) 50 MCG/ACT nasal spray Place 1 spray into both nostrils daily.  Marland Kitchen levothyroxine (SYNTHROID) 50 MCG tablet Take 1 tablet (50 mcg total) by mouth daily before breakfast.  . losartan (COZAAR) 100 MG tablet Take 1 tablet (100 mg total) by mouth daily.  . meloxicam (MOBIC) 15 MG tablet Take 1 tablet (15 mg total) by mouth daily as needed.  . metroNIDAZOLE (METROCREAM) 0.75 % cream Apply topically 2 (two) times daily.  . montelukast (SINGULAIR) 10 MG tablet Take 1 tablet (10 mg total) by mouth at bedtime.  . Multiple Vitamin (MULTIVITAMIN) tablet Take 1 tablet by mouth daily.  . mupirocin ointment (BACTROBAN) 2 % Apply to wound once daily with bandage change  . omeprazole (PRILOSEC) 40 MG capsule Take 1 capsule (40 mg total) by mouth daily.  . rosuvastatin (CRESTOR) 10 MG tablet Take 1 tablet (10 mg total) by mouth daily.  Marland Kitchen triamcinolone (KENALOG) 0.025 % ointment Twice daily for up to 1 week for "cool down" to face  . triamcinolone ointment (KENALOG) 0.1 % Twice a day for up to 1 week for "cool down" to body  . zolmitriptan (ZOMIG-ZMT) 5 MG disintegrating tablet TAKE 1 TABLET BY MOUTH AS NEEDED FOR MIGRAINES. MAY REPEAT AFTER 2-3 HRS. MAX  DOSE 2 TABS IN 24 HRS  . zolpidem (AMBIEN CR) 12.5 MG CR tablet Take 1 tablet (12.5 mg total) by mouth at bedtime as needed. for sleep  . [DISCONTINUED] Diethylpropion HCl CR 75 MG TB24 Take 1 tablet (75 mg total) by mouth daily before breakfast.  . [DISCONTINUED] Galcanezumab-gnlm (EMGALITY) 120 MG/ML SOSY Inject 120 mg into the skin every 30 (thirty) days.  . Diethylpropion HCl CR 75 MG TB24 Take 1 tablet (75 mg total) by mouth daily before breakfast.   No facility-administered encounter medications on file as of 08/21/2022.    Surgical History: Past Surgical History:  Procedure  Laterality Date  . BACK SURGERY    . CARPAL TUNNEL RELEASE    . CARPAL TUNNEL RELEASE Bilateral 2005  . CHOLECYSTECTOMY    . COLPOSCOPY    . OOPHORECTOMY     RSO,LSO  . PELVIC LAPAROSCOPY  2001,2004   DL X 2 RSO and LSO  . TONSILLECTOMY    . TRIGGER FINGER RELEASE Right 08/24/2021  . VAGINAL HYSTERECTOMY  2000   endometriosis    Medical History: Past Medical History:  Diagnosis Date  . Allergy    environmental  . Anxiety   . CIN I (cervical intraepithelial neoplasia I)   . Endometriosis   . GERD (gastroesophageal reflux disease)   . History of actinic keratoses   . Hyperlipidemia   . Hypertension   . Migraine   . Migraines   . Neck pain   . STD (sexually transmitted disease)    HSV ll    Family History: Family History  Problem Relation Age of Onset  . Hypertension Father   . Diabetes Mother   . Hypertension Mother   . Uterine cancer Mother   . Liver disease Mother   . Hypertension Brother   . Heart Problems Brother   . Breast cancer Maternal Aunt        Age 58's  . Aneurysm Maternal Aunt     Social History   Socioeconomic History  . Marital status: Married    Spouse name: Not on file  . Number of children: Not on file  . Years of education: Not on file  . Highest education level: Not on file  Occupational History  . Not on file  Tobacco Use  . Smoking status: Former  . Smokeless tobacco: Never  Vaping Use  . Vaping Use: Never used  Substance and Sexual Activity  . Alcohol use: No    Alcohol/week: 0.0 standard drinks of alcohol  . Drug use: No  . Sexual activity: Not Currently    Birth control/protection: Surgical    Comment: 1st intercourse 67 yo-More than 5 partners  Other Topics Concern  . Not on file  Social History Narrative  . Not on file   Social Determinants of Health   Financial Resource Strain: Not on file  Food Insecurity: Not on file  Transportation Needs: Not on file  Physical Activity: Not on file  Stress: Not on file   Social Connections: Not on file  Intimate Partner Violence: Not on file      Review of Systems  Constitutional:  Negative for chills, fatigue and unexpected weight change.  HENT:  Negative for congestion, rhinorrhea, sneezing and sore throat.   Eyes:  Negative for redness.  Respiratory: Negative.  Negative for cough, chest tightness, shortness of breath and wheezing.   Cardiovascular: Negative.  Negative for chest pain and palpitations.  Gastrointestinal:  Negative for abdominal pain, constipation, diarrhea, nausea  and vomiting.  Genitourinary:  Negative for dysuria and frequency.  Musculoskeletal:  Positive for arthralgias. Negative for joint swelling and neck pain.  Skin:  Negative for rash.  Neurological: Negative.  Negative for tremors and numbness.  Hematological:  Negative for adenopathy. Does not bruise/bleed easily.  Psychiatric/Behavioral:  Negative for behavioral problems (Depression), sleep disturbance and suicidal ideas. The patient is not nervous/anxious.     Vital Signs: BP 131/89   Pulse 82   Temp 97.9 F (36.6 C)   Resp 16   Ht '5\' 5"'$  (1.651 m)   Wt 165 lb (74.8 kg)   SpO2 97%   BMI 27.46 kg/m    Physical Exam Vitals reviewed.  Constitutional:      General: She is not in acute distress.    Appearance: Normal appearance. She is normal weight. She is not ill-appearing.  HENT:     Head: Normocephalic and atraumatic.  Eyes:     Pupils: Pupils are equal, round, and reactive to light.  Cardiovascular:     Rate and Rhythm: Normal rate and regular rhythm.  Pulmonary:     Effort: Pulmonary effort is normal. No respiratory distress.  Neurological:     Mental Status: She is alert and oriented to person, place, and time.     Cranial Nerves: No cranial nerve deficit.     Gait: Gait normal.  Psychiatric:        Mood and Affect: Mood normal.        Behavior: Behavior normal.       Assessment/Plan: 1. Migraine without aura and without status migrainosus,  not intractable *** - Erenumab-aooe (AIMOVIG) 140 MG/ML SOAJ; Inject 140 mg into the skin every 30 (thirty) days.  Dispense: 1.12 mL; Refill: 5  2. Vulvovaginal candidiasis *** - fluconazole (DIFLUCAN) 150 MG tablet; Take 1 tablet (150 mg total) by mouth once for 1 dose. May take an additional dose after 3 days if still symptomatic.  Dispense: 3 tablet; Refill: 0  3. BMI 27.0-27.9,adult *** - Diethylpropion HCl CR 75 MG TB24; Take 1 tablet (75 mg total) by mouth daily before breakfast.  Dispense: 30 tablet; Refill: 0   General Counseling: Macie verbalizes understanding of the findings of todays visit and agrees with plan of treatment. I have discussed any further diagnostic evaluation that may be needed or ordered today. We also reviewed her medications today. she has been encouraged to call the office with any questions or concerns that should arise related to todays visit.    No orders of the defined types were placed in this encounter.   Meds ordered this encounter  Medications  . Erenumab-aooe (AIMOVIG) 140 MG/ML SOAJ    Sig: Inject 140 mg into the skin every 30 (thirty) days.    Dispense:  1.12 mL    Refill:  5    Please send prior authorization if required asap  . Diethylpropion HCl CR 75 MG TB24    Sig: Take 1 tablet (75 mg total) by mouth daily before breakfast.    Dispense:  30 tablet    Refill:  0    Patient will bring good rx coupon, do not run through her insurance  . fluconazole (DIFLUCAN) 150 MG tablet    Sig: Take 1 tablet (150 mg total) by mouth once for 1 dose. May take an additional dose after 3 days if still symptomatic.    Dispense:  3 tablet    Refill:  0    Return in about 4 weeks (  around 09/18/2022) for F/U, Weight loss, Kathy Mccormick PCP.   Total time spent:30 Minutes Time spent includes review of chart, medications, test results, and follow up plan with the patient.   [place] Controlled Substance Database was reviewed by me.  This patient was seen by Jonetta Osgood, FNP-C in collaboration with Dr. Clayborn Bigness as a part of collaborative care agreement.   Kathy Mccormick R. Valetta Fuller, MSN, FNP-C Internal medicine

## 2022-08-23 ENCOUNTER — Other Ambulatory Visit: Payer: Self-pay | Admitting: Nurse Practitioner

## 2022-08-23 DIAGNOSIS — R928 Other abnormal and inconclusive findings on diagnostic imaging of breast: Secondary | ICD-10-CM

## 2022-08-24 ENCOUNTER — Telehealth: Payer: Self-pay

## 2022-08-24 NOTE — Telephone Encounter (Signed)
PA was done for Aimovig.

## 2022-08-31 ENCOUNTER — Telehealth: Payer: Self-pay

## 2022-08-31 NOTE — Telephone Encounter (Signed)
Completed new P.A. for patient's Aimovig.

## 2022-09-03 ENCOUNTER — Encounter: Payer: Self-pay | Admitting: Nurse Practitioner

## 2022-09-20 ENCOUNTER — Ambulatory Visit: Payer: 59 | Admitting: Nurse Practitioner

## 2022-09-24 ENCOUNTER — Ambulatory Visit: Payer: 59 | Admitting: Nurse Practitioner

## 2022-09-25 ENCOUNTER — Other Ambulatory Visit: Payer: Self-pay | Admitting: Nurse Practitioner

## 2022-09-25 DIAGNOSIS — R921 Mammographic calcification found on diagnostic imaging of breast: Secondary | ICD-10-CM

## 2022-09-26 ENCOUNTER — Ambulatory Visit (INDEPENDENT_AMBULATORY_CARE_PROVIDER_SITE_OTHER): Payer: 59 | Admitting: Nurse Practitioner

## 2022-09-26 ENCOUNTER — Encounter: Payer: Self-pay | Admitting: Nurse Practitioner

## 2022-09-26 VITALS — BP 136/88 | HR 89 | Ht 63.25 in | Wt 166.0 lb

## 2022-09-26 DIAGNOSIS — E785 Hyperlipidemia, unspecified: Secondary | ICD-10-CM

## 2022-09-26 DIAGNOSIS — Z780 Asymptomatic menopausal state: Secondary | ICD-10-CM

## 2022-09-26 DIAGNOSIS — Z8349 Family history of other endocrine, nutritional and metabolic diseases: Secondary | ICD-10-CM

## 2022-09-26 DIAGNOSIS — N952 Postmenopausal atrophic vaginitis: Secondary | ICD-10-CM | POA: Diagnosis not present

## 2022-09-26 DIAGNOSIS — Z833 Family history of diabetes mellitus: Secondary | ICD-10-CM

## 2022-09-26 DIAGNOSIS — Z01419 Encounter for gynecological examination (general) (routine) without abnormal findings: Secondary | ICD-10-CM | POA: Diagnosis not present

## 2022-09-26 DIAGNOSIS — Z8639 Personal history of other endocrine, nutritional and metabolic disease: Secondary | ICD-10-CM

## 2022-09-26 MED ORDER — ESTRADIOL 0.1 MG/GM VA CREA: 1.0000 | TOPICAL_CREAM | VAGINAL | 2 refills | Status: DC

## 2022-09-26 NOTE — Addendum Note (Signed)
Addended byMarny Lowenstein on: 09/26/2022 03:46 PM   Modules accepted: Orders

## 2022-09-26 NOTE — Progress Notes (Signed)
Kathy Mccormick Mar 20, 1956 [id number]   History:  67 y.o. G1P0010 presents for annual exam. Postmenopausal - no HRT. S/P 2000 TVH with subsequent BSO for endometriosis in 2001. 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 HTN, HLD, hypothyroidism, GAD managed by PCP. Vaginal estrogen twice weekly.   Gynecologic History No LMP recorded. Patient has had a hysterectomy.   Contraception/Family planning: status post hysterectomy Sexually active: No  Health Maintenance Last Pap: 09/21/2021. Results were: Normal Last mammogram: 11/01/2021. Results were: Right breast calcifications, benign biopsy, 68-month recall recommended Last colonoscopy: 2018. Results were: Normal, 10-year recall Last Dexa: 10/25/2021. Results were: Normal  Past medical history, past surgical history, family history and social history were all reviewed and documented in the EPIC chart. Married. 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   ROS:  A ROS was performed and pertinent positives and negatives are included.  Exam:  Vitals:   09/26/22 1522  BP: 136/88  Pulse: 89  SpO2: 98%  Weight: 166 lb (75.3 kg)  Height: 5' 3.25" (1.607 m)    Body mass index is 29.17 kg/m.  General appearance:  Normal Thyroid:  Symmetrical, normal in size, without palpable masses or nodularity. Respiratory  Auscultation:  Clear without wheezing or rhonchi Cardiovascular  Auscultation:  Regular rate, without rubs, murmurs or gallops  Edema/varicosities:  Not grossly evident Abdominal  Soft,nontender, without masses, guarding or rebound.  Liver/spleen:  No organomegaly noted  Hernia:  None appreciated  Skin  Inspection:  Grossly normal Breasts: Examined lying and sitting.   Right: Without masses, retractions, nipple discharge or axillary adenopathy.   Left: Without masses, retractions, nipple discharge or axillary adenopathy. Genitourinary   Inguinal/mons:  Normal without inguinal adenopathy  External genitalia:  Normal appearing vulva with no  masses, tenderness, or lesions  BUS/Urethra/Skene's glands:  Normal  Vagina:  Normal appearing with normal color and discharge, no lesions. Atrophic changes  Cervix:  Absent  Uterus:  Absent  Adnexa/parametria:     Rt: Normal in size, without masses or tenderness.   Lt: Normal in size, without masses or tenderness.  Anus and perineum: Normal  Digital rectal exam: Deferred  Patient informed chaperone available to be present for breast and pelvic exam. Patient has requested no chaperone to be present. Patient has been advised what will be completed during breast and pelvic exam.   Assessment/Plan:  67 y.o. G1P0010 for annual exam.   Well female exam with routine gynecological exam - Plan: CBC with Differential/Platelet, Comprehensive metabolic panel. Education provided on SBEs, importance of preventative screenings, current guidelines, high calcium diet, regular exercise, and multivitamin daily.   Postmenopausal - S/P 2000 TVH with subsequent BSO for endometriosis in 2001.   Hyperlipidemia, unspecified hyperlipidemia type - Plan: Lipid panel  Family history of thyroid disease - Plan: TSH  History of vitamin D deficiency - Plan: VITAMIN D 25 Hydroxy (Vit-D Deficiency, Fractures)  Family history of diabetes mellitus in mother - Plan: Hemoglobin A1c  Atrophic vaginitis - Plan: estradiol (ESTRACE) 0.1 MG/GM vaginal cream twice weekly. Applies externally.   Screening for cervical cancer - CIN-1 year ago prior to hysterectomy. No longer screening per guidelines.   Screening for breast cancer - Normal mammogram history.  Benign biopsy May 2023. Imaging scheduled in May. Normal breast exam today.   Screening for colon cancer -  2018 colonoscopy. Will repeat at 10-year interval per GI's recommendation.    Screening for osteoporosis - Normal bone density 09/2021. Will repeat at 5-year interval per guidelines.   Return in  1 year for annual.     Tamela Gammon DNP, 3:29 PM 09/26/2022

## 2022-09-26 NOTE — Addendum Note (Signed)
Addended byMarny Lowenstein on: 09/26/2022 04:05 PM   Modules accepted: Orders

## 2022-09-27 ENCOUNTER — Ambulatory Visit: Payer: 59 | Admitting: Dermatology

## 2022-09-27 LAB — HEMOGLOBIN A1C
Est. average glucose Bld gHb Est-mCnc: 120 mg/dL
Hgb A1c MFr Bld: 5.8 % — ABNORMAL HIGH (ref 4.8–5.6)

## 2022-09-28 LAB — COMPREHENSIVE METABOLIC PANEL
ALT: 18 IU/L (ref 0–32)
AST: 18 IU/L (ref 0–40)
Albumin/Globulin Ratio: 2.6 — ABNORMAL HIGH (ref 1.2–2.2)
Albumin: 4.5 g/dL (ref 3.9–4.9)
Alkaline Phosphatase: 90 IU/L (ref 44–121)
BUN/Creatinine Ratio: 14 (ref 12–28)
BUN: 12 mg/dL (ref 8–27)
Bilirubin Total: <0.2 mg/dL (ref 0.0–1.2)
CO2: 24 mmol/L (ref 20–29)
Calcium: 9.7 mg/dL (ref 8.7–10.3)
Chloride: 102 mmol/L (ref 96–106)
Creatinine, Ser: 0.83 mg/dL (ref 0.57–1.00)
Globulin, Total: 1.7 g/dL (ref 1.5–4.5)
Glucose: 85 mg/dL (ref 70–99)
Potassium: 4.8 mmol/L (ref 3.5–5.2)
Sodium: 142 mmol/L (ref 134–144)
Total Protein: 6.2 g/dL (ref 6.0–8.5)
eGFR: 79 mL/min/{1.73_m2} (ref >59)

## 2022-09-28 LAB — LIPID PANEL
Chol/HDL Ratio: 3.4 ratio (ref 0.0–4.4)
Cholesterol, Total: 235 mg/dL — ABNORMAL HIGH (ref 100–199)
HDL: 69 mg/dL (ref >39)
LDL Chol Calc (NIH): 114 mg/dL — ABNORMAL HIGH (ref 0–99)
Triglycerides: 306 mg/dL — ABNORMAL HIGH (ref 0–149)
VLDL Cholesterol Cal: 52 mg/dL — ABNORMAL HIGH (ref 5–40)

## 2022-09-28 LAB — VITAMIN D 25 HYDROXY (VIT D DEFICIENCY, FRACTURES): Vit D, 25-Hydroxy: 53.5 ng/mL (ref 30.0–100.0)

## 2022-09-28 LAB — CBC WITH DIFFERENTIAL/PLATELET
Basophils Absolute: 0.1 10*3/uL (ref 0.0–0.2)
Basos: 1 %
EOS (ABSOLUTE): 0.1 10*3/uL (ref 0.0–0.4)
Eos: 1 %
Hematocrit: 42.3 % (ref 34.0–46.6)
Hemoglobin: 13.8 g/dL (ref 11.1–15.9)
Immature Grans (Abs): 0 10*3/uL (ref 0.0–0.1)
Immature Granulocytes: 0 %
Lymphocytes Absolute: 1.5 10*3/uL (ref 0.7–3.1)
Lymphs: 23 %
MCH: 31 pg (ref 26.6–33.0)
MCHC: 32.6 g/dL (ref 31.5–35.7)
MCV: 95 fL (ref 79–97)
Monocytes Absolute: 0.7 10*3/uL (ref 0.1–0.9)
Monocytes: 11 %
Neutrophils Absolute: 4.1 10*3/uL (ref 1.4–7.0)
Neutrophils: 64 %
Platelets: 359 10*3/uL (ref 150–450)
RBC: 4.45 x10E6/uL (ref 3.77–5.28)
RDW: 12.6 % (ref 11.7–15.4)
WBC: 6.4 10*3/uL (ref 3.4–10.8)

## 2022-09-28 LAB — SPECIMEN STATUS REPORT

## 2022-09-28 LAB — TSH: TSH: 1.27 u[IU]/mL (ref 0.450–4.500)

## 2022-10-02 ENCOUNTER — Encounter: Payer: Self-pay | Admitting: Nurse Practitioner

## 2022-10-02 ENCOUNTER — Ambulatory Visit: Payer: 59 | Admitting: Nurse Practitioner

## 2022-10-02 VITALS — BP 140/68 | HR 81 | Temp 97.3°F | Resp 16 | Ht 63.0 in | Wt 168.4 lb

## 2022-10-02 DIAGNOSIS — F5101 Primary insomnia: Secondary | ICD-10-CM

## 2022-10-02 DIAGNOSIS — I10 Essential (primary) hypertension: Secondary | ICD-10-CM | POA: Diagnosis not present

## 2022-10-02 DIAGNOSIS — Z6827 Body mass index (BMI) 27.0-27.9, adult: Secondary | ICD-10-CM

## 2022-10-02 DIAGNOSIS — G43009 Migraine without aura, not intractable, without status migrainosus: Secondary | ICD-10-CM

## 2022-10-02 DIAGNOSIS — F411 Generalized anxiety disorder: Secondary | ICD-10-CM

## 2022-10-02 DIAGNOSIS — Z760 Encounter for issue of repeat prescription: Secondary | ICD-10-CM

## 2022-10-02 DIAGNOSIS — F410 Panic disorder [episodic paroxysmal anxiety] without agoraphobia: Secondary | ICD-10-CM

## 2022-10-02 MED ORDER — ALPRAZOLAM 0.5 MG PO TABS
0.5000 mg | ORAL_TABLET | Freq: Two times a day (BID) | ORAL | 0 refills | Status: DC | PRN
Start: 2022-10-02 — End: 2023-01-04

## 2022-10-02 MED ORDER — ZOLMITRIPTAN 5 MG PO TBDP: ORAL_TABLET | ORAL | 3 refills | Status: DC

## 2022-10-02 MED ORDER — ZOLPIDEM TARTRATE ER 12.5 MG PO TBCR: 12.5000 mg | EXTENDED_RELEASE_TABLET | Freq: Every evening | ORAL | 0 refills | Status: DC | PRN

## 2022-10-02 MED ORDER — DIETHYLPROPION HCL ER 75 MG PO TB24: 75.0000 mg | ORAL_TABLET | Freq: Every day | ORAL | 1 refills | Status: DC

## 2022-10-02 MED ORDER — AIMOVIG 140 MG/ML ~~LOC~~ SOAJ: 140.0000 mg | SUBCUTANEOUS | 5 refills | Status: DC

## 2022-10-02 NOTE — Progress Notes (Signed)
[facility] [address] [place], [place] [address]  Internal MEDICINE  Office Visit Note  Patient Name: Kathy Mccormick  [id number]  [id number]  Date of Service: 10/02/2022  Chief Complaint  Patient presents with   Follow-up    Weight loss    Hyperlipidemia   Hypertension   Gastroesophageal Reflux    HPI Kathy Mccormick presents for a follow-up visit for migraines, weight loss and hypertension.  Migraines -- prior authorization was denied for emgality and aimovig  She has chronic migraines with aura.  Approx 16-20 migraine days per month.  Her aura consists of numbness to the right side of the face, difficulty speaking, motor weakness in her area and/or seeing flickering lights and spots.  Her migraines last for several hours, are unilateral, pulsating and start behind her right or left eye. She also has nausea, photophobia and phonophobia with her migraines and movement makes the pain worse.  In the past she has tried [employer], aimovig, rizatriptan, zolmitriptan, ubrelvy, topiramate, propranololm amitriptyline, sumitriptan and nurtec. The monthly injectable 2. Weight loss -- wants to continue diethylpropion helps decrease her appetite. 3. Hypertension -- blood pressure is controlled with current medications Wants to have her women's health care done here.       Current Medication: Outpatient Encounter Medications as of 10/02/2022  Medication Sig   acyclovir (ZOVIRAX) 400 MG tablet Take 1 tablet (400 mg total) by mouth 2 (two) times daily.   atorvastatin (LIPITOR) 10 MG tablet    Azelaic Acid 15 % gel After skin is thoroughly washed and patted dry, gently but thoroughly massage a thin film of azelaic acid cream into the affected area twice daily, in the morning and evening.   busPIRone (BUSPAR) 5 MG tablet Take 1-2 tablets (5-10 mg total) by mouth 2 (two) times daily.   calcipotriene (DOVONOX) 0.005 % cream Apply topically 2 (two) times daily. As directed   CHERRY PO Take by  mouth.   ciclopirox (LOPROX) 0.77 % cream Apply topically 2 (two) times daily.   cycloSPORINE (RESTASIS) 0.05 % ophthalmic emulsion Place 1 drop into both eyes 2 (two) times daily.   desonide (DESOWEN) 0.05 % cream APPLY 1 APPLICATION  TOPICALLY 3 TIMES DAILY AS  NEEDED   doxycycline (VIBRA-TABS) 100 MG tablet Take 1 tablet (100 mg total) by mouth daily.   DULoxetine (CYMBALTA) 60 MG capsule TAKE 1 CAPSULE BY MOUTH 1 TO 2  TIMES DAILY   estradiol (ESTRACE) 0.1 MG/GM vaginal cream Place 1 Applicatorful vaginally 2 (two) times a week.   fluorouracil (EFUDEX) 5 % cream Apply topically 2 (two) times daily. As directed   fluticasone (FLONASE) 50 MCG/ACT nasal spray Place 1 spray into both nostrils daily.   levothyroxine (SYNTHROID) 50 MCG tablet Take 1 tablet (50 mcg total) by mouth daily before breakfast.   losartan (COZAAR) 100 MG tablet Take 1 tablet (100 mg total) by mouth daily.   meloxicam (MOBIC) 15 MG tablet Take 1 tablet (15 mg total) by mouth daily as needed.   metroNIDAZOLE (METROCREAM) 0.75 % cream Apply topically 2 (two) times daily.   montelukast (SINGULAIR) 10 MG tablet Take 1 tablet (10 mg total) by mouth at bedtime.   Multiple Vitamin (MULTIVITAMIN) tablet Take 1 tablet by mouth daily.   omeprazole (PRILOSEC) 40 MG capsule Take 1 capsule (40 mg total) by mouth daily.   triamcinolone (KENALOG) 0.025 % ointment Twice daily for up to 1 week for "cool down" to face   triamcinolone ointment (KENALOG) 0.1 %  Twice a day for up to 1 week for "cool down" to body   [DISCONTINUED] ALPRAZolam (XANAX) 0.5 MG tablet Take 1 tablet (0.5 mg total) by mouth 2 (two) times daily as needed for anxiety. for anxiety   [DISCONTINUED] Diethylpropion HCl CR 75 MG TB24 Take 1 tablet (75 mg total) by mouth daily before breakfast.   [DISCONTINUED] Erenumab-aooe (AIMOVIG) 140 MG/ML SOAJ Inject 140 mg into the skin every 30 (thirty) days.   [DISCONTINUED] zolmitriptan (ZOMIG-ZMT) 5 MG disintegrating tablet TAKE 1  TABLET BY MOUTH AS NEEDED FOR MIGRAINES. MAY REPEAT AFTER 2-3 HRS. MAX DOSE 2 TABS IN 24 HRS   [DISCONTINUED] zolpidem (AMBIEN CR) 12.5 MG CR tablet Take 1 tablet (12.5 mg total) by mouth at bedtime as needed. for sleep   ALPRAZolam (XANAX) 0.5 MG tablet Take 1 tablet (0.5 mg total) by mouth 2 (two) times daily as needed for anxiety. for anxiety   Diethylpropion HCl CR 75 MG TB24 Take 1 tablet (75 mg total) by mouth daily before breakfast.   Erenumab-aooe (AIMOVIG) 140 MG/ML SOAJ Inject 140 mg into the skin every 30 (thirty) days.   zolmitriptan (ZOMIG-ZMT) 5 MG disintegrating tablet TAKE 1 TABLET BY MOUTH AS NEEDED FOR MIGRAINES. MAY REPEAT AFTER 2-3 HRS. MAX DOSE 2 TABS IN 24 HRS   zolpidem (AMBIEN CR) 12.5 MG CR tablet Take 1 tablet (12.5 mg total) by mouth at bedtime as needed. for sleep   [DISCONTINUED] zolmitriptan (ZOMIG-ZMT) 5 MG disintegrating tablet TAKE 1 TABLET BY MOUTH AS NEEDED FOR MIGRAINES. MAY REPEAT AFTER 2-3 HRS. MAX DOSE 2 TABS IN 24 HRS   No facility-administered encounter medications on file as of 10/02/2022.    Surgical History: Past Surgical History:  Procedure Laterality Date   BACK SURGERY     CARPAL TUNNEL RELEASE     CARPAL TUNNEL RELEASE Bilateral 2005   CHOLECYSTECTOMY     COLPOSCOPY     OOPHORECTOMY     RSO,LSO   PELVIC LAPAROSCOPY  2001,2004   DL X 2 RSO and LSO   TONSILLECTOMY     TRIGGER FINGER RELEASE Right 08/24/2021   VAGINAL HYSTERECTOMY  2000   endometriosis    Medical History: Past Medical History:  Diagnosis Date   Allergy    environmental   Anxiety    CIN I (cervical intraepithelial neoplasia I)    Endometriosis    GERD (gastroesophageal reflux disease)    History of actinic keratoses    Hyperlipidemia    Hypertension    Migraine    Migraines    Neck pain    STD (sexually transmitted disease)    HSV ll    Family History: Family History  Problem Relation Age of Onset   Hypertension Father    Diabetes Mother    Hypertension  Mother    Uterine cancer Mother    Liver disease Mother    Hypertension Brother    Heart Problems Brother    Breast cancer Maternal Aunt        Age 53's   Aneurysm Maternal Aunt     Social History   Socioeconomic History   Marital status: Married    Spouse name: Not on file   Number of children: Not on file   Years of education: Not on file   Highest education level: Not on file  Occupational History   Not on file  Tobacco Use   Smoking status: Former   Smokeless tobacco: Never  Vaping Use   Vaping Use: Never  used  Substance and Sexual Activity   Alcohol use: No    Alcohol/week: 0.0 standard drinks of alcohol   Drug use: No   Sexual activity: Not Currently    Birth control/protection: Surgical, Post-menopausal    Comment: Hyst; First IC @ 31 y/o, >5 Partners, No DES exposure  Other Topics Concern   Not on file  Social History Narrative   Not on file   Social Determinants of Health   Financial Resource Strain: Not on file  Food Insecurity: Not on file  Transportation Needs: Not on file  Physical Activity: Not on file  Stress: Not on file  Social Connections: Not on file  Intimate Partner Violence: Not on file      Review of Systems  Constitutional:  Negative for chills, fatigue and unexpected weight change.  HENT:  Negative for congestion, rhinorrhea, sneezing and sore throat.   Eyes:  Negative for redness.  Respiratory: Negative.  Negative for cough, chest tightness, shortness of breath and wheezing.   Cardiovascular: Negative.  Negative for chest pain and palpitations.  Gastrointestinal:  Positive for nausea. Negative for abdominal pain, constipation, diarrhea and vomiting.  Genitourinary:  Negative for dysuria and frequency.  Musculoskeletal:  Positive for arthralgias. Negative for joint swelling and neck pain.  Skin:  Negative for rash.  Neurological:  Positive for weakness and headaches. Negative for tremors and numbness.  Hematological:  Negative for  adenopathy. Does not bruise/bleed easily.  Psychiatric/Behavioral:  Negative for behavioral problems (Depression), sleep disturbance and suicidal ideas. The patient is not nervous/anxious.     Vital Signs: BP (!) 140/68   Pulse 81   Temp (!) 97.3 F (36.3 C)   Resp 16   Ht 5\' 3"  (1.6 m)   Wt 168 lb 6.4 oz (76.4 kg)   SpO2 98%   BMI 29.83 kg/m    Physical Exam Vitals reviewed.  Constitutional:      General: She is not in acute distress.    Appearance: Normal appearance. She is normal weight. She is not ill-appearing.  HENT:     Head: Normocephalic and atraumatic.  Eyes:     Pupils: Pupils are equal, round, and reactive to light.  Cardiovascular:     Rate and Rhythm: Normal rate and regular rhythm.  Pulmonary:     Effort: Pulmonary effort is normal. No respiratory distress.  Neurological:     Mental Status: She is alert and oriented to person, place, and time.     Cranial Nerves: No cranial nerve deficit.     Gait: Gait normal.  Psychiatric:        Mood and Affect: Mood normal.        Behavior: Behavior normal.        Assessment/Plan: 1. Migraine without aura and without status migrainosus, not intractable Continue zolmitriptan as prescribed.  Wait for prior authorization to be tried again for aimovig - Erenumab-aooe (AIMOVIG) 140 MG/ML SOAJ; Inject 140 mg into the skin every 30 (thirty) days.  Dispense: 1.12 mL; Refill: 5 - zolmitriptan (ZOMIG-ZMT) 5 MG disintegrating tablet; TAKE 1 TABLET BY MOUTH AS NEEDED FOR MIGRAINES. MAY REPEAT AFTER 2-3 HRS. MAX DOSE 2 TABS IN 24 HRS  Dispense: 10 tablet; Refill: 3  2. Essential (primary) hypertension Stable, continue medications as prescribed.   3. BMI 27.0-27.9,adult Continue diethylpropion as prescribed.  - Diethylpropion HCl CR 75 MG TB24; Take 1 tablet (75 mg total) by mouth daily before breakfast.  Dispense: 30 tablet; Refill: 1  4. Primary insomnia  Stable, continue ambien as prescribed.  - zolpidem (AMBIEN CR)  12.5 MG CR tablet; Take 1 tablet (12.5 mg total) by mouth at bedtime as needed. for sleep  Dispense: 90 tablet; Refill: 0  5. Generalized anxiety disorder with panic attacks Stable, continue prn alprazolam as prescribed.  - ALPRAZolam (XANAX) 0.5 MG tablet; Take 1 tablet (0.5 mg total) by mouth 2 (two) times daily as needed for anxiety. for anxiety  Dispense: 60 tablet; Refill: 0   General Counseling: Orie verbalizes understanding of the findings of todays visit and agrees with plan of treatment. I have discussed any further diagnostic evaluation that may be needed or ordered today. We also reviewed her medications today. she has been encouraged to call the office with any questions or concerns that should arise related to todays visit.    No orders of the defined types were placed in this encounter.   Meds ordered this encounter  Medications   DISCONTD: zolmitriptan (ZOMIG-ZMT) 5 MG disintegrating tablet    Sig: TAKE 1 TABLET BY MOUTH AS NEEDED FOR MIGRAINES. MAY REPEAT AFTER 2-3 HRS. MAX DOSE 2 TABS IN 24 HRS    Dispense:  10 tablet    Refill:  3   zolpidem (AMBIEN CR) 12.5 MG CR tablet    Sig: Take 1 tablet (12.5 mg total) by mouth at bedtime as needed. for sleep    Dispense:  90 tablet    Refill:  0   Erenumab-aooe (AIMOVIG) 140 MG/ML SOAJ    Sig: Inject 140 mg into the skin every 30 (thirty) days.    Dispense:  1.12 mL    Refill:  5    Please send prior authorization if required asap   Diethylpropion HCl CR 75 MG TB24    Sig: Take 1 tablet (75 mg total) by mouth daily before breakfast.    Dispense:  30 tablet    Refill:  1    Patient will bring good rx coupon, do not run through her insurance   ALPRAZolam (XANAX) 0.5 MG tablet    Sig: Take 1 tablet (0.5 mg total) by mouth 2 (two) times daily as needed for anxiety. for anxiety    Dispense:  60 tablet    Refill:  0   zolmitriptan (ZOMIG-ZMT) 5 MG disintegrating tablet    Sig: TAKE 1 TABLET BY MOUTH AS NEEDED FOR MIGRAINES.  MAY REPEAT AFTER 2-3 HRS. MAX DOSE 2 TABS IN 24 HRS    Dispense:  10 tablet    Refill:  3    Return in about 1 month (around 11/01/2022) for F/U, Weight loss, Rolland Steinert PCP and migraines .   Total time spent:30 Minutes Time spent includes review of chart, medications, test results, and follow up plan with the patient.   [place] Controlled Substance Database was reviewed by me.  This patient was seen by Sallyanne Kuster, FNP-C in collaboration with Dr. Beverely Risen as a part of collaborative care agreement.   Jasman Murri R. Tedd Sias, MSN, FNP-C Internal medicine

## 2022-10-13 ENCOUNTER — Encounter: Payer: Self-pay | Admitting: Nurse Practitioner

## 2022-10-14 ENCOUNTER — Other Ambulatory Visit: Payer: Self-pay | Admitting: Nurse Practitioner

## 2022-10-14 DIAGNOSIS — Z760 Encounter for issue of repeat prescription: Secondary | ICD-10-CM

## 2022-10-23 ENCOUNTER — Ambulatory Visit: Payer: 59 | Admitting: Nurse Practitioner

## 2022-10-23 ENCOUNTER — Encounter: Payer: Self-pay | Admitting: Nurse Practitioner

## 2022-10-23 VITALS — BP 151/94 | HR 94 | Temp 98.4°F | Resp 16 | Ht 63.0 in | Wt 168.0 lb

## 2022-10-23 DIAGNOSIS — L299 Pruritus, unspecified: Secondary | ICD-10-CM

## 2022-10-23 DIAGNOSIS — T781XXA Other adverse food reactions, not elsewhere classified, initial encounter: Secondary | ICD-10-CM

## 2022-10-23 MED ORDER — METHYLPREDNISOLONE ACETATE 80 MG/ML IJ SUSP
80.0000 mg | Freq: Once | INTRAMUSCULAR | Status: AC
Start: 2022-10-23 — End: 2022-10-23
  Administered 2022-10-23: 80 mg via INTRAMUSCULAR

## 2022-10-23 MED ORDER — TRIAMCINOLONE ACETONIDE 0.1 % EX CREA
1.0000 | TOPICAL_CREAM | Freq: Two times a day (BID) | CUTANEOUS | 0 refills | Status: AC
Start: 2022-10-23 — End: ?

## 2022-10-23 MED ORDER — PREDNISONE 10 MG (21) PO TBPK
ORAL_TABLET | ORAL | 0 refills | Status: DC
Start: 2022-10-23 — End: 2023-01-15

## 2022-10-23 NOTE — Progress Notes (Signed)
[facility] [address] [place], [place] [address]  Internal MEDICINE  Office Visit Note  Patient Name: Kathy Mccormick  [id number]  [id number]  Date of Service: 10/23/2022  Chief Complaint  Patient presents with   Acute Visit    Possible allergic reaction to peanut butter.       HPI Kathy Mccormick presents for an acute sick visit for allergic reaction? To peanut butter Ate peanut butter  Tingling and itching of both hands and lips started after eating the peanut butter. Has not experienced this when eating peanut butter before. No rash present but hands look slightly swollen.      Current Medication:  Outpatient Encounter Medications as of 10/23/2022  Medication Sig   acyclovir (ZOVIRAX) 400 MG tablet Take 1 tablet (400 mg total) by mouth 2 (two) times daily.   ALPRAZolam (XANAX) 0.5 MG tablet Take 1 tablet (0.5 mg total) by mouth 2 (two) times daily as needed for anxiety. for anxiety   atorvastatin (LIPITOR) 10 MG tablet    Azelaic Acid 15 % gel After skin is thoroughly washed and patted dry, gently but thoroughly massage a thin film of azelaic acid cream into the affected area twice daily, in the morning and evening.   busPIRone (BUSPAR) 5 MG tablet TAKE 1 TO 2 TABLETS BY MOUTH  TWICE DAILY   calcipotriene (DOVONOX) 0.005 % cream Apply topically 2 (two) times daily. As directed   CHERRY PO Take by mouth.   ciclopirox (LOPROX) 0.77 % cream Apply topically 2 (two) times daily.   cycloSPORINE (RESTASIS) 0.05 % ophthalmic emulsion Place 1 drop into both eyes 2 (two) times daily.   desonide (DESOWEN) 0.05 % cream APPLY 1 APPLICATION  TOPICALLY 3 TIMES DAILY AS  NEEDED   Diethylpropion HCl CR 75 MG TB24 Take 1 tablet (75 mg total) by mouth daily before breakfast.   doxycycline (VIBRA-TABS) 100 MG tablet Take 1 tablet (100 mg total) by mouth daily.   DULoxetine (CYMBALTA) 60 MG capsule TAKE 1 CAPSULE BY MOUTH 1 TO 2  TIMES DAILY   Erenumab-aooe (AIMOVIG) 140 MG/ML SOAJ Inject  140 mg into the skin every 30 (thirty) days.   estradiol (ESTRACE) 0.1 MG/GM vaginal cream Place 1 Applicatorful vaginally 2 (two) times a week.   fluorouracil (EFUDEX) 5 % cream Apply topically 2 (two) times daily. As directed   fluticasone (FLONASE) 50 MCG/ACT nasal spray Place 1 spray into both nostrils daily.   levothyroxine (SYNTHROID) 50 MCG tablet Take 1 tablet (50 mcg total) by mouth daily before breakfast.   losartan (COZAAR) 100 MG tablet Take 1 tablet (100 mg total) by mouth daily.   meloxicam (MOBIC) 15 MG tablet Take 1 tablet (15 mg total) by mouth daily as needed.   metroNIDAZOLE (METROCREAM) 0.75 % cream Apply topically 2 (two) times daily.   montelukast (SINGULAIR) 10 MG tablet Take 1 tablet (10 mg total) by mouth at bedtime.   Multiple Vitamin (MULTIVITAMIN) tablet Take 1 tablet by mouth daily.   omeprazole (PRILOSEC) 40 MG capsule Take 1 capsule (40 mg total) by mouth daily.   predniSONE (STERAPRED UNI-PAK 21 TAB) 10 MG (21) TBPK tablet Use as directed for 6 days   triamcinolone (KENALOG) 0.025 % ointment Twice daily for up to 1 week for "cool down" to face   triamcinolone cream (KENALOG) 0.1 % Apply 1 Application topically 2 (two) times daily.   zolmitriptan (ZOMIG-ZMT) 5 MG disintegrating tablet TAKE 1 TABLET BY MOUTH AS NEEDED FOR MIGRAINES. MAY REPEAT  AFTER 2-3 HRS. MAX DOSE 2 TABS IN 24 HRS   zolpidem (AMBIEN CR) 12.5 MG CR tablet Take 1 tablet (12.5 mg total) by mouth at bedtime as needed. for sleep   [DISCONTINUED] triamcinolone ointment (KENALOG) 0.1 % Twice a day for up to 1 week for "cool down" to body   [EXPIRED] methylPREDNISolone acetate (DEPO-MEDROL) injection 80 mg    No facility-administered encounter medications on file as of 10/23/2022.      Medical History: Past Medical History:  Diagnosis Date   Allergy    environmental   Anxiety    CIN I (cervical intraepithelial neoplasia I)    Endometriosis    GERD (gastroesophageal reflux disease)    History  of actinic keratoses    Hyperlipidemia    Hypertension    Migraine    Migraines    Neck pain    STD (sexually transmitted disease)    HSV ll     Vital Signs: BP (!) 151/94   Pulse 94   Temp 98.4 F (36.9 C)   Resp 16   Ht 5\' 3"  (1.6 m)   Wt 168 lb (76.2 kg)   SpO2 99%   BMI 29.76 kg/m    Review of Systems  Constitutional: Negative.   HENT: Negative.    Respiratory: Negative.  Negative for cough, chest tightness, shortness of breath and wheezing.   Cardiovascular: Negative.  Negative for chest pain and palpitations.  Gastrointestinal: Negative.   Musculoskeletal:        Slight swelling of hands.  Skin:        Pruritus of hands and arms. Tingling of hands, arms, and esp around mouth  Neurological:        Tingling of mouth arm, and hands    Physical Exam Vitals reviewed.  Constitutional:      General: She is not in acute distress.    Appearance: Normal appearance. She is not ill-appearing.  HENT:     Head: Normocephalic and atraumatic.  Eyes:     Pupils: Pupils are equal, round, and reactive to light.  Cardiovascular:     Rate and Rhythm: Normal rate and regular rhythm.  Pulmonary:     Effort: Pulmonary effort is normal. No respiratory distress.  Skin:    Findings: No rash (no rash).     Comments: Constant itching and tingling of hands and arms and somewhat around mouth  Neurological:     Mental Status: She is alert and oriented to person, place, and time.  Psychiatric:        Mood and Affect: Mood normal.        Behavior: Behavior normal.       Assessment/Plan: 1. Pruritus Depo-medrol administered in office.  Prednisone taper prescribed, take as directed.  Topical steroid provided as well.  - methylPREDNISolone acetate (DEPO-MEDROL) injection 80 mg - predniSONE (STERAPRED UNI-PAK 21 TAB) 10 MG (21) TBPK tablet; Use as directed for 6 days  Dispense: 21 tablet; Refill: 0 - triamcinolone cream (KENALOG) 0.1 %; Apply 1 Application topically 2 (two)  times daily.  Dispense: 30 g; Refill: 0  2. Allergic reaction to peanut Testing ordered to test for other allergies.  - IgE Food w/Component Reflex II - methylPREDNISolone acetate (DEPO-MEDROL) injection 80 mg - predniSONE (STERAPRED UNI-PAK 21 TAB) 10 MG (21) TBPK tablet; Use as directed for 6 days  Dispense: 21 tablet; Refill: 0 - triamcinolone cream (KENALOG) 0.1 %; Apply 1 Application topically 2 (two) times daily.  Dispense: 30 g; Refill:  0   General Counseling: Twylia verbalizes understanding of the findings of todays visit and agrees with plan of treatment. I have discussed any further diagnostic evaluation that may be needed or ordered today. We also reviewed her medications today. she has been encouraged to call the office with any questions or concerns that should arise related to todays visit.    Counseling:    Orders Placed This Encounter  Procedures   IgE Food w/Component Reflex II    Meds ordered this encounter  Medications   methylPREDNISolone acetate (DEPO-MEDROL) injection 80 mg   predniSONE (STERAPRED UNI-PAK 21 TAB) 10 MG (21) TBPK tablet    Sig: Use as directed for 6 days    Dispense:  21 tablet    Refill:  0   triamcinolone cream (KENALOG) 0.1 %    Sig: Apply 1 Application topically 2 (two) times daily.    Dispense:  30 g    Refill:  0    Return if symptoms worsen or fail to improve.  [place] Controlled Substance Database was reviewed by me for overdose risk score (ORS)  Time spent:20 Minutes Time spent with patient included reviewing progress notes, labs, imaging studies, and discussing plan for follow up.   This patient was seen by Sallyanne Kuster, FNP-C in collaboration with Dr. Beverely Risen as a part of collaborative care agreement.  Milliana Reddoch R. Tedd Sias, MSN, FNP-C Internal Medicine

## 2022-10-24 ENCOUNTER — Other Ambulatory Visit: Payer: Self-pay | Admitting: Dermatology

## 2022-10-29 LAB — IGE FOOD W/COMPONENT REFLEX II
Allergen Corn, IgE: <0.1 kU/L
Clam IgE: <0.1 kU/L
Codfish IgE: <0.1 kU/L
F001-IgE Egg White: <0.1 kU/L
F002-IgE Milk: <0.1 kU/L
F017-IgE Hazelnut (Filbert): <0.1 kU/L
F018-IgE Brazil Nut: <0.1 kU/L
F020-IgE Almond: <0.1 kU/L
F202-IgE Cashew Nut: <0.1 kU/L
F203-IgE Pistachio Nut: <0.1 kU/L
F256-IgE Walnut: <0.1 kU/L
Macadamia Nut, IgE: <0.1 kU/L
Peanut, IgE: <0.1 kU/L
Pecan Nut IgE: <0.1 kU/L
Scallop IgE: <0.1 kU/L
Sesame Seed IgE: <0.1 kU/L
Shrimp IgE: <0.1 kU/L
Soybean IgE: <0.1 kU/L
Wheat IgE: <0.1 kU/L

## 2022-11-03 ENCOUNTER — Encounter: Payer: Self-pay | Admitting: Nurse Practitioner

## 2022-11-06 ENCOUNTER — Encounter: Payer: Self-pay | Admitting: Dermatology

## 2022-11-06 ENCOUNTER — Ambulatory Visit: Payer: 59 | Admitting: Dermatology

## 2022-11-06 VITALS — BP 108/69 | HR 76

## 2022-11-06 DIAGNOSIS — X32XXXA Exposure to sunlight, initial encounter: Secondary | ICD-10-CM

## 2022-11-06 DIAGNOSIS — L570 Actinic keratosis: Secondary | ICD-10-CM | POA: Diagnosis not present

## 2022-11-06 DIAGNOSIS — W908XXA Exposure to other nonionizing radiation, initial encounter: Secondary | ICD-10-CM

## 2022-11-06 DIAGNOSIS — Z889 Allergy status to unspecified drugs, medicaments and biological substances status: Secondary | ICD-10-CM

## 2022-11-06 DIAGNOSIS — Z9109 Other allergy status, other than to drugs and biological substances: Secondary | ICD-10-CM | POA: Diagnosis not present

## 2022-11-06 DIAGNOSIS — L578 Other skin changes due to chronic exposure to nonionizing radiation: Secondary | ICD-10-CM

## 2022-11-06 NOTE — Progress Notes (Signed)
Follow-Up Visit   Subjective  Kathy Mccormick is a 67 y.o. female who presents for the following: hx of ak New spot at left lower leg she noticed 2 months. Reports that is painful. Also reports itchy bump at left posterior shoulder. Also wants to discuss recent allergic reaction that occurred minutes after she ate peanut butter.  Itchy mouth and swollen burning hands.  Benadryl and Zyrtec didn't help. Treated with shot and prednisone.  Hands better now. Negative blood testing for nut allergies   The following portions of the chart were reviewed this encounter and updated as appropriate: medications, allergies, medical history  Review of Systems:  No other skin or systemic complaints except as noted in HPI or Assessment and Plan.  Objective  Well appearing patient in no apparent distress; mood and affect are within normal limits.    A focused examination was performed of the following areas: Face, left shoulder, left leg, b/l hands  Relevant exam findings are noted in the Assessment and Plan.  b/l hands Clear today, photos showed large amount of hand edema BL  left forearm x 3, left elbow x 1, left lateral clavicle 1 , left shoulder x 1, left pretibia x 1, left posterior shoulder x 1, Right forearm  x 3, right zygoma x 1, right lower cheek x 1 (13) Pink firm papules, some with excoriations/crusting    Assessment & Plan   History of allergic reaction b/l hands  History of allergic reaction at b/l hands, Occurred after eating peanut butter  Seen by PCP, had allergy testing to peanuts and other nuts which was negative. Patent is concerned she has other allergies and wants further evaluation  Refer to [facility].     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 They are chronic with expected duration over 1 year. A portion of actinic keratoses will progress to squamous cell carcinoma of the skin. It is not possible to reliably predict which spots will progress to skin cancer and so treatment is recommended to prevent development of skin cancer.  Recommend daily broad spectrum sunscreen SPF 30+ to sun-exposed areas, reapply every 2 hours as needed.  Recommend staying in the shade or wearing long sleeves, sun glasses (UVA+UVB protection) and wide brim hats (4-inch brim around the entire circumference of the hat). Call for new or changing lesions.  Destruction of lesion - left forearm x 3, left elbow x 1, left lateral clavicle 1 , left shoulder x 1, left pretibia x 1, left posterior shoulder x 1, Right forearm  x 3, right zygoma x 1, right lower cheek x 1  Destruction method: cryotherapy   Informed consent: discussed and consent obtained   Lesion destroyed using liquid nitrogen: Yes   Region frozen until ice ball extended beyond lesion: Yes   Outcome: patient tolerated procedure well with no complications   Post-procedure details: wound care instructions given   Additional details:  Prior to procedure, discussed risks of blister formation, small wound, skin dyspigmentation, or rare scar following cryotherapy. Recommend Vaseline ointment to  treated areas while healing.    ACTINIC DAMAGE WITH PRECANCEROUS ACTINIC KERATOSES Counseling for Topical Chemotherapy Management: Patient exhibits: - Severe, confluent actinic changes with pre-cancerous actinic keratoses that is secondary to cumulative UV radiation exposure over time - Condition that is severe; chronic, not at goal. - diffuse scaly erythematous macules and papules with underlying  dyspigmentation - Discussed Prescription "Field Treatment" topical Chemotherapy for Severe, Chronic Confluent Actinic Changes with Pre-Cancerous Actinic Keratoses Field treatment involves treatment of an entire area of skin that has confluent Actinic Changes (Sun/ Ultraviolet light damage) and PreCancerous Actinic Keratoses by method of PhotoDynamic Therapy (PDT) and/or prescription Topical Chemotherapy agents such as 5-fluorouracil, 5-fluorouracil/calcipotriene, and/or imiquimod.  The purpose is to decrease the number of clinically evident and subclinical PreCancerous lesions to prevent progression to development of skin cancer by chemically destroying early precancer changes that may or may not be visible.  It has been shown to reduce the risk of developing skin cancer in the treated area. As a result of treatment, redness, scaling, crusting, and open sores may occur during treatment course. One or more than one of these methods may be used and may have to be used several times to control, suppress and eliminate the PreCancerous changes. Discussed treatment course, expected reaction, and possible side effects. - Recommend daily broad spectrum sunscreen SPF 30+ to sun-exposed areas, reapply every 2 hours as needed.  - Staying in the shade or wearing long sleeves, sun glasses (UVA+UVB protection) and wide brim hats (4-inch brim around the entire circumference of the hat) are also recommended. - Call for new or changing lesions.  Will discuss on f/up- pt has 5-fluorouracil/calcipotriene cream, but doesn't want to use until fall/winter  Reviewed course of treatment and expected reaction.  Patient advised to expect inflammation and crusting and advised that erosions are possible.  Patient advised to be diligent with sun protection during and after treatment. Counseled to keep medication out of reach of children and pets.    Return for 2 - 3 month ak follow up.  I, Asher Muir, CMA, am acting as scribe  for Willeen Niece, MD.   Documentation: I have reviewed the above documentation for accuracy and completeness, and I agree with the above.  Willeen Niece, MD

## 2022-11-06 NOTE — Patient Instructions (Addendum)
Actinic keratoses are precancerous spots that appear secondary to cumulative UV radiation exposure/sun exposure over time. They are chronic with expected duration over 1 year. A portion of actinic keratoses will progress to squamous cell carcinoma of the skin. It is not possible to reliably predict which spots will progress to skin cancer and so treatment is recommended to prevent development of skin cancer.  Recommend daily broad spectrum sunscreen SPF 30+ to sun-exposed areas, reapply every 2 hours as needed.  Recommend staying in the shade or wearing long sleeves, sun glasses (UVA+UVB protection) and wide brim hats (4-inch brim around the entire circumference of the hat). Call for new or changing lesions.   Cryotherapy Aftercare  Wash gently with soap and water everyday.   Apply Vaseline and Band-Aid daily until healed.   Due to recent changes in healthcare laws, you may see results of your pathology and/or laboratory studies on MyChart before the doctors have had a chance to review them. We understand that in some cases there may be results that are confusing or concerning to you. Please understand that not all results are received at the same time and often the doctors may need to interpret multiple results in order to provide you with the best plan of care or course of treatment. Therefore, we ask that you please give us 2 business days to thoroughly review all your results before contacting the office for clarification. Should we see a critical lab result, you will be contacted sooner.   If You Need Anything After Your Visit  If you have any questions or concerns for your doctor, please call our main line at [phone] and press option 4 to reach your doctor's medical assistant. If no one answers, please leave a voicemail as directed and we will return your call as soon as possible. Messages left after 4 pm will be answered the following business day.   You may also send us a message via  MyChart. We typically respond to MyChart messages within 1-2 business days.  For prescription refills, please ask your pharmacy to contact our office. Our fax number is [phone].  If you have an urgent issue when the clinic is closed that cannot wait until the next business day, you can page your doctor at the number below.    Please note that while we do our best to be available for urgent issues outside of office hours, we are not available 24/7.   If you have an urgent issue and are unable to reach us, you may choose to seek medical care at your doctor's office, retail clinic, urgent care center, or emergency room.  If you have a medical emergency, please immediately call 911 or go to the emergency department.  Pager Numbers  - Dr. Kowalski: [phone]  - Dr. Moye: [phone]  - Dr. Stewart: [phone]  In the event of inclement weather, please call our main line at [phone] for an update on the status of any delays or closures.  Dermatology Medication Tips: Please keep the boxes that topical medications come in in order to help keep track of the instructions about where and how to use these. Pharmacies typically print the medication instructions only on the boxes and not directly on the medication tubes.   If your medication is too expensive, please contact our office at [phone] option 4 or send us a message through MyChart.   We are unable to tell what your co-pay for medications will be in advance as this is different   depending on your insurance coverage. However, we may be able to find a substitute medication at lower cost or fill out paperwork to get insurance to cover a needed medication.   If a prior authorization is required to get your medication covered by your insurance company, please allow us 1-2 business days to complete this process.  Drug prices often vary depending on where the prescription is filled and some pharmacies may offer cheaper  prices.  The website www.goodrx.com contains coupons for medications through different pharmacies. The prices here do not account for what the cost may be with help from insurance (it may be cheaper with your insurance), but the website can give you the price if you did not use any insurance.  - You can print the associated coupon and take it with your prescription to the pharmacy.  - You may also stop by our office during regular business hours and pick up a GoodRx coupon card.  - If you need your prescription sent electronically to a different pharmacy, notify our office through [facility] MyChart or by phone at [phone] option 4.     Si Usted Necesita Algo Despus de Su Visita  Tambin puede enviarnos un mensaje a travs de MyChart. Por lo general respondemos a los mensajes de MyChart en el transcurso de 1 a 2 das hbiles.  Para renovar recetas, por favor pida a su farmacia que se ponga en contacto con nuestra oficina. Nuestro nmero de fax es el [phone].  Si tiene un asunto urgente cuando la clnica est cerrada y que no puede esperar hasta el siguiente da hbil, puede llamar/localizar a su doctor(a) al nmero que aparece a continuacin.   Por favor, tenga en cuenta que aunque hacemos todo lo posible para estar disponibles para asuntos urgentes fuera del horario de oficina, no estamos disponibles las 24 horas del da, los 7 das de la semana.   Si tiene un problema urgente y no puede comunicarse con nosotros, puede optar por buscar atencin mdica  en el consultorio de su doctor(a), en una clnica privada, en un centro de atencin urgente o en una sala de emergencias.  Si tiene una emergencia mdica, por favor llame inmediatamente al 911 o vaya a la sala de emergencias.  Nmeros de bper  - Dr. Kowalski: [phone]  - Dra. Moye: [phone]  - Dra. Stewart: [phone]  En caso de inclemencias del tiempo, por favor llame a nuestra lnea principal al [phone]  para una actualizacin sobre el estado de cualquier retraso o cierre.  Consejos para la medicacin en dermatologa: Por favor, guarde las cajas en las que vienen los medicamentos de uso tpico para ayudarle a seguir las instrucciones sobre dnde y cmo usarlos. Las farmacias generalmente imprimen las instrucciones del medicamento slo en las cajas y no directamente en los tubos del medicamento.   Si su medicamento es muy caro, por favor, pngase en contacto con nuestra oficina llamando al [phone] y presione la opcin 4 o envenos un mensaje a travs de MyChart.   No podemos decirle cul ser su copago por los medicamentos por adelantado ya que esto es diferente dependiendo de la cobertura de su seguro. Sin embargo, es posible que podamos encontrar un medicamento sustituto a menor costo o llenar un formulario para que el seguro cubra el medicamento que se considera necesario.   Si se requiere una autorizacin previa para que su compaa de seguros cubra su medicamento, por favor permtanos de 1 a 2 das hbiles para completar este   proceso.  Los precios de los medicamentos varan con frecuencia dependiendo del lugar de dnde se surte la receta y alguna farmacias pueden ofrecer precios ms baratos.  El sitio web www.goodrx.com tiene cupones para medicamentos de diferentes farmacias. Los precios aqu no tienen en cuenta lo que podra costar con la ayuda del seguro (puede ser ms barato con su seguro), pero el sitio web puede darle el precio si no utiliz ningn seguro.  - Puede imprimir el cupn correspondiente y llevarlo con su receta a la farmacia.  - Tambin puede pasar por nuestra oficina durante el horario de atencin regular y recoger una tarjeta de cupones de GoodRx.  - Si necesita que su receta se enve electrnicamente a una farmacia diferente, informe a nuestra oficina a travs de MyChart de [facility] o por telfono llamando al [phone] y presione la opcin 4.  

## 2022-11-09 ENCOUNTER — Other Ambulatory Visit: Payer: Self-pay | Admitting: Nurse Practitioner

## 2022-11-09 ENCOUNTER — Ambulatory Visit
Admission: RE | Admit: 2022-11-09 | Discharge: 2022-11-09 | Disposition: A | Discharge status: Home / Self Care | Payer: 59 | Source: Ambulatory Visit | Attending: Nurse Practitioner | Admitting: Nurse Practitioner

## 2022-11-09 DIAGNOSIS — R921 Mammographic calcification found on diagnostic imaging of breast: Secondary | ICD-10-CM

## 2022-11-12 ENCOUNTER — Ambulatory Visit: Payer: 59 | Admitting: Nurse Practitioner

## 2022-11-14 ENCOUNTER — Ambulatory Visit: Payer: 59 | Admitting: Dermatology

## 2022-11-15 ENCOUNTER — Encounter: Payer: Self-pay | Admitting: Nurse Practitioner

## 2022-11-15 ENCOUNTER — Ambulatory Visit: Payer: 59 | Admitting: Nurse Practitioner

## 2022-11-15 VITALS — BP 140/72 | HR 74 | Temp 96.7°F | Resp 16 | Ht 63.0 in | Wt 163.4 lb

## 2022-11-15 DIAGNOSIS — I10 Essential (primary) hypertension: Secondary | ICD-10-CM

## 2022-11-15 DIAGNOSIS — R635 Abnormal weight gain: Secondary | ICD-10-CM | POA: Diagnosis not present

## 2022-11-15 DIAGNOSIS — Z6827 Body mass index (BMI) 27.0-27.9, adult: Secondary | ICD-10-CM | POA: Diagnosis not present

## 2022-11-15 DIAGNOSIS — G43E19 Chronic migraine with aura, intractable, without status migrainosus: Secondary | ICD-10-CM | POA: Diagnosis not present

## 2022-11-15 MED ORDER — QULIPTA 60 MG PO TABS
60.0000 mg | ORAL_TABLET | Freq: Every day | ORAL | 5 refills | Status: DC
Start: 2022-11-15 — End: 2023-01-15

## 2022-11-15 MED ORDER — DIETHYLPROPION HCL ER 75 MG PO TB24: 75.0000 mg | ORAL_TABLET | Freq: Every day | ORAL | 1 refills | Status: DC

## 2022-11-15 MED ORDER — AIMOVIG 140 MG/ML ~~LOC~~ SOAJ
140.0000 mg | SUBCUTANEOUS | 5 refills | Status: DC
Start: 2022-11-15 — End: 2023-01-15

## 2022-11-15 NOTE — Progress Notes (Signed)
[facility] [address] [place], [place] [address]  Internal MEDICINE  Office Visit Note  Patient Name: Kathy Mccormick  [id number]  [id number]  Date of Service: 11/15/2022  Chief Complaint  Patient presents with   Gastroesophageal Reflux   Hypertension   Hyperlipidemia   Follow-up    Review labs    HPI Kathy Mccormick presents for a follow-up visit for migraines and lab Chronic Migraines -- prior authorization was denied for emgality and aimovig  She has chronic migraines with aura.  Approx 16-20 migraine days per month.  Her aura consists of numbness to the right side of the face, difficulty speaking, motor weakness in her area and/or seeing flickering lights and spots.  Her migraines last for several hours, are unilateral, pulsating and start behind her right or left eye. She also has nausea, photophobia and phonophobia with her migraines and movement makes the pain worse.  In the past she has tried: [employer], aimovig, rizatriptan, zolmitriptan, ubrelvy, topiramate, propranolol, amitriptyline, sumitriptan and nurtec.   Per patient emgality is not covered by her insurance, but aimovig is covered by her insurance.   --lab was negative for any allergies including peanuts --lost 5 lbs since last visit, has been taking diethylpropion and would like to continue the medication. --states her blood pressure is up because she is upset about her insurance denying the migraine medication.    Current Medication: Outpatient Encounter Medications as of 11/15/2022  Medication Sig   acyclovir (ZOVIRAX) 400 MG tablet Take 1 tablet (400 mg total) by mouth 2 (two) times daily.   ALPRAZolam (XANAX) 0.5 MG tablet Take 1 tablet (0.5 mg total) by mouth 2 (two) times daily as needed for anxiety. for anxiety   Atogepant (QULIPTA) 60 MG TABS Take 1 tablet (60 mg total) by mouth daily.   atorvastatin (LIPITOR) 10 MG tablet    Azelaic Acid 15 % gel After skin is thoroughly washed and patted dry,  gently but thoroughly massage a thin film of azelaic acid cream into the affected area twice daily, in the morning and evening.   busPIRone (BUSPAR) 5 MG tablet TAKE 1 TO 2 TABLETS BY MOUTH  TWICE DAILY   calcipotriene (DOVONOX) 0.005 % cream Apply topically 2 (two) times daily. As directed   CHERRY PO Take by mouth.   ciclopirox (LOPROX) 0.77 % cream Apply topically 2 (two) times daily.   cycloSPORINE (RESTASIS) 0.05 % ophthalmic emulsion Place 1 drop into both eyes 2 (two) times daily.   desonide (DESOWEN) 0.05 % cream APPLY 1 APPLICATION  TOPICALLY 3 TIMES DAILY AS  NEEDED   doxycycline (VIBRA-TABS) 100 MG tablet Take 1 tablet (100 mg total) by mouth daily.   DULoxetine (CYMBALTA) 60 MG capsule TAKE 1 CAPSULE BY MOUTH 1 TO 2  TIMES DAILY   estradiol (ESTRACE) 0.1 MG/GM vaginal cream Place 1 Applicatorful vaginally 2 (two) times a week.   fluorouracil (EFUDEX) 5 % cream Apply topically 2 (two) times daily. As directed   fluticasone (FLONASE) 50 MCG/ACT nasal spray Place 1 spray into both nostrils daily.   levothyroxine (SYNTHROID) 50 MCG tablet Take 1 tablet (50 mcg total) by mouth daily before breakfast.   losartan (COZAAR) 100 MG tablet Take 1 tablet (100 mg total) by mouth daily.   meloxicam (MOBIC) 15 MG tablet Take 1 tablet (15 mg total) by mouth daily as needed.   metroNIDAZOLE (METROCREAM) 0.75 % cream Apply topically 2 (two) times daily.   montelukast (SINGULAIR) 10 MG tablet Take 1 tablet (  10 mg total) by mouth at bedtime.   Multiple Vitamin (MULTIVITAMIN) tablet Take 1 tablet by mouth daily.   omeprazole (PRILOSEC) 40 MG capsule Take 1 capsule (40 mg total) by mouth daily.   predniSONE (STERAPRED UNI-PAK 21 TAB) 10 MG (21) TBPK tablet Use as directed for 6 days   triamcinolone (KENALOG) 0.025 % ointment Twice daily for up to 1 week for "cool down" to face   triamcinolone cream (KENALOG) 0.1 % Apply 1 Application topically 2 (two) times daily.   triamcinolone ointment (KENALOG) 0.1 %  APPLY TWICE A DAY FOR UP TO 1 WEEK FOR "COOL DOWN" TO BODY   zolmitriptan (ZOMIG-ZMT) 5 MG disintegrating tablet TAKE 1 TABLET BY MOUTH AS NEEDED FOR MIGRAINES. MAY REPEAT AFTER 2-3 HRS. MAX DOSE 2 TABS IN 24 HRS   zolpidem (AMBIEN CR) 12.5 MG CR tablet Take 1 tablet (12.5 mg total) by mouth at bedtime as needed. for sleep   [DISCONTINUED] Diethylpropion HCl CR 75 MG TB24 Take 1 tablet (75 mg total) by mouth daily before breakfast.   [DISCONTINUED] Erenumab-aooe (AIMOVIG) 140 MG/ML SOAJ Inject 140 mg into the skin every 30 (thirty) days.   Diethylpropion HCl CR 75 MG TB24 Take 1 tablet (75 mg total) by mouth daily before breakfast.   Erenumab-aooe (AIMOVIG) 140 MG/ML SOAJ Inject 140 mg into the skin every 30 (thirty) days.   No facility-administered encounter medications on file as of 11/15/2022.    Surgical History: Past Surgical History:  Procedure Laterality Date   BACK SURGERY     BREAST BIOPSY Right 11/15/2021   MM RT BREAST BX W LOC DEV 1ST LESION IMAGE BX SPEC STEREO GUIDE 11/15/2021 [facility]   CARPAL TUNNEL RELEASE     CARPAL TUNNEL RELEASE Bilateral 2005   CHOLECYSTECTOMY     COLPOSCOPY     OOPHORECTOMY     RSO,LSO   PELVIC LAPAROSCOPY  2001,2004   DL X 2 RSO and LSO   TONSILLECTOMY     TRIGGER FINGER RELEASE Right 08/24/2021   VAGINAL HYSTERECTOMY  2000   endometriosis    Medical History: Past Medical History:  Diagnosis Date   Allergy    environmental   Anxiety    CIN I (cervical intraepithelial neoplasia I)    Endometriosis    GERD (gastroesophageal reflux disease)    History of actinic keratoses    Hyperlipidemia    Hypertension    Migraine    Migraines    Neck pain    STD (sexually transmitted disease)    HSV ll    Family History: Family History  Problem Relation Age of Onset   Hypertension Father    Diabetes Mother    Hypertension Mother    Uterine cancer Mother    Liver disease Mother    Hypertension Brother    Heart Problems Brother     Breast cancer Maternal Aunt        Age 71's   Aneurysm Maternal Aunt     Social History   Socioeconomic History   Marital status: Married    Spouse name: Not on file   Number of children: Not on file   Years of education: Not on file   Highest education level: Not on file  Occupational History   Not on file  Tobacco Use   Smoking status: Former   Smokeless tobacco: Never  Vaping Use   Vaping Use: Never used  Substance and Sexual Activity   Alcohol use: No    Alcohol/week:  0.0 standard drinks of alcohol   Drug use: No   Sexual activity: Not Currently    Birth control/protection: Surgical, Post-menopausal    Comment: Hyst; First IC @ 54 y/o, >5 Partners, No DES exposure  Other Topics Concern   Not on file  Social History Narrative   Not on file   Social Determinants of Health   Financial Resource Strain: Not on file  Food Insecurity: Not on file  Transportation Needs: Not on file  Physical Activity: Not on file  Stress: Not on file  Social Connections: Not on file  Intimate Partner Violence: Not on file      Review of Systems  Constitutional:  Negative for chills, fatigue and unexpected weight change.  HENT:  Negative for congestion, rhinorrhea, sneezing and sore throat.   Eyes:  Negative for redness.  Respiratory: Negative.  Negative for cough, chest tightness, shortness of breath and wheezing.   Cardiovascular: Negative.  Negative for chest pain and palpitations.  Gastrointestinal:  Positive for nausea. Negative for abdominal pain, constipation, diarrhea and vomiting.  Genitourinary:  Negative for dysuria and frequency.  Musculoskeletal:  Positive for arthralgias. Negative for joint swelling and neck pain.  Skin:  Negative for rash.  Neurological:  Positive for weakness and headaches. Negative for tremors and numbness.  Hematological:  Negative for adenopathy. Does not bruise/bleed easily.  Psychiatric/Behavioral:  Negative for behavioral problems  (Depression), sleep disturbance and suicidal ideas. The patient is not nervous/anxious.     Vital Signs: BP (!) 140/72   Pulse 74   Temp (!) 96.7 F (35.9 C)   Resp 16   Ht 5\' 3"  (1.6 m)   Wt 163 lb 6.4 oz (74.1 kg)   SpO2 97%   BMI 28.95 kg/m    Physical Exam Vitals reviewed.  Constitutional:      General: She is not in acute distress.    Appearance: Normal appearance. She is normal weight. She is not ill-appearing.  HENT:     Head: Normocephalic and atraumatic.  Eyes:     Pupils: Pupils are equal, round, and reactive to light.  Cardiovascular:     Rate and Rhythm: Normal rate and regular rhythm.  Pulmonary:     Effort: Pulmonary effort is normal. No respiratory distress.  Neurological:     Mental Status: She is alert and oriented to person, place, and time.     Cranial Nerves: No cranial nerve deficit.     Gait: Gait normal.  Psychiatric:        Mood and Affect: Mood normal.        Behavior: Behavior normal.        Assessment/Plan: 1. Intractable chronic migraine with aura and without status migrainosus Samples of qulipta given to patient. Qulipta and aimovig prescribed and will see which one will be covered by her insurance.  - Atogepant (QULIPTA) 60 MG TABS; Take 1 tablet (60 mg total) by mouth daily.  Dispense: 30 tablet; Refill: 5 - Erenumab-aooe (AIMOVIG) 140 MG/ML SOAJ; Inject 140 mg into the skin every 30 (thirty) days.  Dispense: 1.12 mL; Refill: 5  2. Essential (primary) hypertension Slightly elevated, patient is upset about insurance issue and has been having increased pain due to frequent migraines.   3. Abnormal weight gain Continue diethylpropion as prescribed. Follow up in 8 weeks.  - Diethylpropion HCl CR 75 MG TB24; Take 1 tablet (75 mg total) by mouth daily before breakfast.  Dispense: 30 tablet; Refill: 1  4. BMI 27.0-27.9,adult Down 5  lbs from prior office visit. Continue diethylpropion as prescribed.  - Diethylpropion HCl CR 75 MG TB24;  Take 1 tablet (75 mg total) by mouth daily before breakfast.  Dispense: 30 tablet; Refill: 1   General Counseling: Aasiya verbalizes understanding of the findings of todays visit and agrees with plan of treatment. I have discussed any further diagnostic evaluation that may be needed or ordered today. We also reviewed her medications today. she has been encouraged to call the office with any questions or concerns that should arise related to todays visit.    No orders of the defined types were placed in this encounter.   Meds ordered this encounter  Medications   Atogepant (QULIPTA) 60 MG TABS    Sig: Take 1 tablet (60 mg total) by mouth daily.    Dispense:  30 tablet    Refill:  5    Dx code G43.E19   Erenumab-aooe (AIMOVIG) 140 MG/ML SOAJ    Sig: Inject 140 mg into the skin every 30 (thirty) days.    Dispense:  1.12 mL    Refill:  5    Dx code G43.E19, Please send prior authorization if required asap   Diethylpropion HCl CR 75 MG TB24    Sig: Take 1 tablet (75 mg total) by mouth daily before breakfast.    Dispense:  30 tablet    Refill:  1    Patient will bring good rx coupon, do not run through her insurance    Return in about 8 weeks (around 01/10/2023).   Total time spent:30 Minutes Time spent includes review of chart, medications, test results, and follow up plan with the patient.   [place] Controlled Substance Database was reviewed by me.  This patient was seen by Sallyanne Kuster, FNP-C in collaboration with Dr. Beverely Risen as a part of collaborative care agreement.   Xochilt Conant R. Tedd Sias, MSN, FNP-C Internal medicine

## 2022-11-16 ENCOUNTER — Telehealth: Payer: Self-pay

## 2022-11-16 NOTE — Telephone Encounter (Signed)
Completed P.A. for patient's Aimovig.

## 2022-12-15 ENCOUNTER — Other Ambulatory Visit: Payer: Self-pay | Admitting: Nurse Practitioner

## 2022-12-15 DIAGNOSIS — Z760 Encounter for issue of repeat prescription: Secondary | ICD-10-CM

## 2023-01-01 ENCOUNTER — Other Ambulatory Visit: Payer: Self-pay | Admitting: Nurse Practitioner

## 2023-01-01 DIAGNOSIS — F410 Panic disorder [episodic paroxysmal anxiety] without agoraphobia: Secondary | ICD-10-CM

## 2023-01-01 DIAGNOSIS — Z760 Encounter for issue of repeat prescription: Secondary | ICD-10-CM

## 2023-01-01 DIAGNOSIS — F5101 Primary insomnia: Secondary | ICD-10-CM

## 2023-01-01 DIAGNOSIS — I10 Essential (primary) hypertension: Secondary | ICD-10-CM

## 2023-01-08 ENCOUNTER — Ambulatory Visit: Payer: 59 | Admitting: Dermatology

## 2023-01-10 ENCOUNTER — Ambulatory Visit: Payer: 59 | Admitting: Nurse Practitioner

## 2023-01-15 ENCOUNTER — Encounter: Payer: Self-pay | Admitting: Nurse Practitioner

## 2023-01-15 ENCOUNTER — Ambulatory Visit: Payer: 59 | Admitting: Nurse Practitioner

## 2023-01-15 VITALS — BP 135/85 | HR 79 | Temp 98.5°F | Resp 16 | Ht 63.0 in | Wt 160.8 lb

## 2023-01-15 DIAGNOSIS — Z6827 Body mass index (BMI) 27.0-27.9, adult: Secondary | ICD-10-CM | POA: Diagnosis not present

## 2023-01-15 DIAGNOSIS — Z760 Encounter for issue of repeat prescription: Secondary | ICD-10-CM

## 2023-01-15 DIAGNOSIS — G43E19 Chronic migraine with aura, intractable, without status migrainosus: Secondary | ICD-10-CM | POA: Diagnosis not present

## 2023-01-15 DIAGNOSIS — F410 Panic disorder [episodic paroxysmal anxiety] without agoraphobia: Secondary | ICD-10-CM

## 2023-01-15 DIAGNOSIS — F5101 Primary insomnia: Secondary | ICD-10-CM

## 2023-01-15 DIAGNOSIS — Z79899 Other long term (current) drug therapy: Secondary | ICD-10-CM

## 2023-01-15 DIAGNOSIS — R635 Abnormal weight gain: Secondary | ICD-10-CM | POA: Diagnosis not present

## 2023-01-15 DIAGNOSIS — G43009 Migraine without aura, not intractable, without status migrainosus: Secondary | ICD-10-CM

## 2023-01-15 DIAGNOSIS — I10 Essential (primary) hypertension: Secondary | ICD-10-CM

## 2023-01-15 MED ORDER — ATORVASTATIN CALCIUM 10 MG PO TABS
10.0000 mg | ORAL_TABLET | Freq: Every day | ORAL | 3 refills | Status: DC
Start: 2023-01-15 — End: 2023-11-17

## 2023-01-15 MED ORDER — DIETHYLPROPION HCL ER 75 MG PO TB24: 75.0000 mg | ORAL_TABLET | Freq: Every day | ORAL | 1 refills | Status: DC

## 2023-01-15 MED ORDER — FLUTICASONE PROPIONATE 50 MCG/ACT NA SUSP: 1.0000 | Freq: Every day | NASAL | 1 refills | Status: DC

## 2023-01-15 MED ORDER — ZOLPIDEM TARTRATE ER 12.5 MG PO TBCR
12.5000 mg | EXTENDED_RELEASE_TABLET | Freq: Every evening | ORAL | 2 refills | Status: DC | PRN
Start: 2023-01-15 — End: 2023-04-18

## 2023-01-15 MED ORDER — ALPRAZOLAM 0.5 MG PO TABS
0.5000 mg | ORAL_TABLET | Freq: Two times a day (BID) | ORAL | 2 refills | Status: DC | PRN
Start: 2023-01-15 — End: 2023-04-18

## 2023-01-15 MED ORDER — QULIPTA 60 MG PO TABS
60.0000 mg | ORAL_TABLET | Freq: Every day | ORAL | 5 refills | Status: DC
Start: 2023-01-15 — End: 2023-10-07

## 2023-01-15 MED ORDER — QULIPTA 60 MG PO TABS
60.0000 mg | ORAL_TABLET | Freq: Every day | ORAL | 5 refills | Status: DC
Start: 2023-01-15 — End: 2023-01-15

## 2023-01-15 MED ORDER — ZOLMITRIPTAN 5 MG PO TBDP
ORAL_TABLET | ORAL | 3 refills | Status: DC
Start: 2023-01-15 — End: 2023-03-11

## 2023-01-15 NOTE — Progress Notes (Signed)
[facility] [address] [place], [place] [address]  Internal MEDICINE  Office Visit Note  Patient Name: Kathy Mccormick  [id number]  [id number]  Date of Service: 01/15/2023  Chief Complaint  Patient presents with   Gastroesophageal Reflux   Hypertension   Hyperlipidemia   Follow-up    Migraines      HPI Kathy Mccormick presents for a follow-up visit for chronic migraines, insomnia, anxiety, and weight gain.  Chronic migraines -- aimovig and qulipta were both denied by her insurance. Again. Chronic Migraines -- prior authorization was denied for emgality and aimovig  She has chronic migraines with aura.  Approx 16-20 migraine days per month.  Her aura consists of numbness to the right side of the face, difficulty speaking, motor weakness in her area and/or seeing flickering lights and spots.  Her migraines last for several hours, are unilateral, pulsating and start behind her right or left eye. She also has nausea, photophobia and phonophobia with her migraines and movement makes the pain worse.  In the past she has tried: [employer], aimovig, rizatriptan, zolmitriptan, ubrelvy, topiramate, propranolol, amitriptyline, sumitriptan and nurtec.  Insomnia taking ambien CR to help her sleep Anxiety -- takes alprazolam prn  Weight gain/overweight -- diethylpropion is helping her lose/maintain weight and helps curb her appetite. Wants to continue the medication    Current Medication: Outpatient Encounter Medications as of 01/15/2023  Medication Sig   acyclovir (ZOVIRAX) 400 MG tablet TAKE 1 TABLET BY MOUTH TWICE  DAILY   Azelaic Acid 15 % gel AFTER SKIN IS THOROUGHLY WASHED AND PATTED DRY, GENTLY BUT THOROUGHLY MASSAGE A THIN FILM TOPICALLY INTO AFFECTED AREA TWICE DAILY (IN THE MORNING AND IN THE EVENING )   busPIRone (BUSPAR) 5 MG tablet TAKE 1 TO 2 TABLETS BY MOUTH  TWICE DAILY   calcipotriene (DOVONOX) 0.005 % cream Apply topically 2 (two) times daily. As directed   CHERRY PO Take  by mouth.   ciclopirox (LOPROX) 0.77 % cream Apply topically 2 (two) times daily.   desonide (DESOWEN) 0.05 % cream APPLY 1 APPLICATION  TOPICALLY 3 TIMES DAILY AS  NEEDED   doxycycline (VIBRA-TABS) 100 MG tablet TAKE 1 TABLET BY MOUTH DAILY   DULoxetine (CYMBALTA) 60 MG capsule TAKE 1 CAPSULE BY MOUTH 1 TO 2  TIMES DAILY   estradiol (ESTRACE) 0.1 MG/GM vaginal cream Place 1 Applicatorful vaginally 2 (two) times a week.   fluorouracil (EFUDEX) 5 % cream Apply topically 2 (two) times daily. As directed   levothyroxine (SYNTHROID) 50 MCG tablet Take 1 tablet (50 mcg total) by mouth daily before breakfast.   losartan (COZAAR) 100 MG tablet Take 1 tablet (100 mg total) by mouth daily.   meloxicam (MOBIC) 15 MG tablet Take 1 tablet (15 mg total) by mouth daily as needed.   metroNIDAZOLE (METROCREAM) 0.75 % cream Apply topically 2 (two) times daily.   montelukast (SINGULAIR) 10 MG tablet TAKE 1 TABLET BY MOUTH AT  BEDTIME   Multiple Vitamin (MULTIVITAMIN) tablet Take 1 tablet by mouth daily.   omeprazole (PRILOSEC) 40 MG capsule Take 1 capsule (40 mg total) by mouth daily.   triamcinolone (KENALOG) 0.025 % ointment Twice daily for up to 1 week for "cool down" to face   triamcinolone cream (KENALOG) 0.1 % Apply 1 Application topically 2 (two) times daily.   triamcinolone ointment (KENALOG) 0.1 % APPLY TWICE A DAY FOR UP TO 1 WEEK FOR "COOL DOWN" TO BODY   [DISCONTINUED] ALPRAZolam (XANAX) 0.5 MG tablet Take 1 tablet (  0.5 mg total) by mouth 2 (two) times daily as needed for anxiety. for anxiety   [DISCONTINUED] Atogepant (QULIPTA) 60 MG TABS Take 1 tablet (60 mg total) by mouth daily.   [DISCONTINUED] atorvastatin (LIPITOR) 10 MG tablet    [DISCONTINUED] cycloSPORINE (RESTASIS) 0.05 % ophthalmic emulsion Place 1 drop into both eyes 2 (two) times daily.   [DISCONTINUED] Diethylpropion HCl CR 75 MG TB24 Take 1 tablet (75 mg total) by mouth daily before breakfast.   [DISCONTINUED] Erenumab-aooe (AIMOVIG)  140 MG/ML SOAJ Inject 140 mg into the skin every 30 (thirty) days.   [DISCONTINUED] fluticasone (FLONASE) 50 MCG/ACT nasal spray Place 1 spray into both nostrils daily.   [DISCONTINUED] predniSONE (STERAPRED UNI-PAK 21 TAB) 10 MG (21) TBPK tablet Use as directed for 6 days   [DISCONTINUED] zolmitriptan (ZOMIG-ZMT) 5 MG disintegrating tablet TAKE 1 TABLET BY MOUTH AS NEEDED FOR MIGRAINES. MAY REPEAT AFTER 2-3 HRS. MAX DOSE 2 TABS IN 24 HRS   [DISCONTINUED] zolpidem (AMBIEN CR) 12.5 MG CR tablet Take 1 tablet (12.5 mg total) by mouth at bedtime as needed for sleep. for sleep   ALPRAZolam (XANAX) 0.5 MG tablet Take 1 tablet (0.5 mg total) by mouth 2 (two) times daily as needed for anxiety. for anxiety   Atogepant (QULIPTA) 60 MG TABS Take 1 tablet (60 mg total) by mouth daily.   atorvastatin (LIPITOR) 10 MG tablet Take 1 tablet (10 mg total) by mouth daily.   Diethylpropion HCl CR 75 MG TB24 Take 1 tablet (75 mg total) by mouth daily before breakfast.   fluticasone (FLONASE) 50 MCG/ACT nasal spray Place 1 spray into both nostrils daily.   zolmitriptan (ZOMIG-ZMT) 5 MG disintegrating tablet TAKE 1 TABLET BY MOUTH AS NEEDED FOR MIGRAINES. MAY REPEAT AFTER 2-3 HRS. MAX DOSE 2 TABS IN 24 HRS   zolpidem (AMBIEN CR) 12.5 MG CR tablet Take 1 tablet (12.5 mg total) by mouth at bedtime as needed for sleep. for sleep   [DISCONTINUED] Atogepant (QULIPTA) 60 MG TABS Take 1 tablet (60 mg total) by mouth daily.   No facility-administered encounter medications on file as of 01/15/2023.    Surgical History: Past Surgical History:  Procedure Laterality Date   BACK SURGERY     BREAST BIOPSY Right 11/15/2021   MM RT BREAST BX W LOC DEV 1ST LESION IMAGE BX SPEC STEREO GUIDE 11/15/2021 [facility]   CARPAL TUNNEL RELEASE     CARPAL TUNNEL RELEASE Bilateral 2005   CHOLECYSTECTOMY     COLPOSCOPY     OOPHORECTOMY     RSO,LSO   PELVIC LAPAROSCOPY  2001,2004   DL X 2 RSO and LSO   TONSILLECTOMY     TRIGGER  FINGER RELEASE Right 08/24/2021   VAGINAL HYSTERECTOMY  2000   endometriosis    Medical History: Past Medical History:  Diagnosis Date   Allergy    environmental   Anxiety    CIN I (cervical intraepithelial neoplasia I)    Endometriosis    GERD (gastroesophageal reflux disease)    History of actinic keratoses    Hyperlipidemia    Hypertension    Migraine    Migraines    Neck pain    STD (sexually transmitted disease)    HSV ll    Family History: Family History  Problem Relation Age of Onset   Hypertension Father    Diabetes Mother    Hypertension Mother    Uterine cancer Mother    Liver disease Mother    Hypertension Brother  Heart Problems Brother    Breast cancer Maternal Aunt        Age 42's   Aneurysm Maternal Aunt     Social History   Socioeconomic History   Marital status: Married    Spouse name: Not on file   Number of children: Not on file   Years of education: Not on file   Highest education level: Not on file  Occupational History   Not on file  Tobacco Use   Smoking status: Former   Smokeless tobacco: Never  Vaping Use   Vaping status: Never Used  Substance and Sexual Activity   Alcohol use: No    Alcohol/week: 0.0 standard drinks of alcohol   Drug use: No   Sexual activity: Not Currently    Birth control/protection: Surgical, Post-menopausal    Comment: Hyst; First IC @ 35 y/o, >5 Partners, No DES exposure  Other Topics Concern   Not on file  Social History Narrative   Not on file   Social Determinants of Health   Financial Resource Strain: Not on file  Food Insecurity: Not on file  Transportation Needs: Not on file  Physical Activity: Not on file  Stress: Not on file  Social Connections: Not on file  Intimate Partner Violence: Not on file      Review of Systems  Constitutional:  Negative for chills, fatigue and unexpected weight change.  HENT:  Negative for congestion, rhinorrhea, sneezing and sore throat.   Eyes:   Negative for redness.  Respiratory: Negative.  Negative for cough, chest tightness, shortness of breath and wheezing.   Cardiovascular: Negative.  Negative for chest pain and palpitations.  Gastrointestinal:  Positive for nausea. Negative for abdominal pain, constipation, diarrhea and vomiting.  Genitourinary:  Negative for dysuria and frequency.  Musculoskeletal:  Positive for arthralgias. Negative for joint swelling and neck pain.  Skin:  Negative for rash.  Neurological:  Positive for weakness and headaches. Negative for tremors and numbness.  Hematological:  Negative for adenopathy. Does not bruise/bleed easily.  Psychiatric/Behavioral:  Negative for behavioral problems (Depression), sleep disturbance and suicidal ideas. The patient is not nervous/anxious.     Vital Signs: BP 135/85 Comment: 140/88  Pulse 79   Temp 98.5 F (36.9 C)   Resp 16   Ht 5\' 3"  (1.6 m)   Wt 160 lb 12.8 oz (72.9 kg)   SpO2 98%   BMI 28.48 kg/m    Physical Exam Vitals reviewed.  Constitutional:      General: She is not in acute distress.    Appearance: Normal appearance. She is normal weight. She is not ill-appearing.  HENT:     Head: Normocephalic and atraumatic.  Eyes:     Pupils: Pupils are equal, round, and reactive to light.  Cardiovascular:     Rate and Rhythm: Normal rate and regular rhythm.  Pulmonary:     Effort: Pulmonary effort is normal. No respiratory distress.  Neurological:     Mental Status: She is alert and oriented to person, place, and time.     Cranial Nerves: No cranial nerve deficit.     Gait: Gait normal.  Psychiatric:        Mood and Affect: Mood normal.        Behavior: Behavior normal.        Assessment/Plan: 1. Intractable chronic migraine with aura and without status migrainosus Qulipta prescribed, redo prior authorization. Continue zomig as prescribed.  - Atogepant (QULIPTA) 60 MG TABS; Take 1 tablet (60  mg total) by mouth daily.  Dispense: 30 tablet;  Refill: 5 - zolmitriptan (ZOMIG-ZMT) 5 MG disintegrating tablet; TAKE 1 TABLET BY MOUTH AS NEEDED FOR MIGRAINES. MAY REPEAT AFTER 2-3 HRS. MAX DOSE 2 TABS IN 24 HRS  Dispense: 10 tablet; Refill: 3  2. Abnormal weight gain Continue diethylpropion as prescribed.  - Diethylpropion HCl CR 75 MG TB24; Take 1 tablet (75 mg total) by mouth daily before breakfast.  Dispense: 30 tablet; Refill: 1  3. Primary insomnia Continue ambien as prescribed.  - zolpidem (AMBIEN CR) 12.5 MG CR tablet; Take 1 tablet (12.5 mg total) by mouth at bedtime as needed for sleep. for sleep  Dispense: 30 tablet; Refill: 2  4. BMI 27.0-27.9,adult Weight maintaining, continue diethylpropion as prescribed.  - Diethylpropion HCl CR 75 MG TB24; Take 1 tablet (75 mg total) by mouth daily before breakfast.  Dispense: 30 tablet; Refill: 1  5. Encounter for medication review Medication list reviewed with patient, updated and refills ordered - fluticasone (FLONASE) 50 MCG/ACT nasal spray; Place 1 spray into both nostrils daily.  Dispense: 48 g; Refill: 1 - ALPRAZolam (XANAX) 0.5 MG tablet; Take 1 tablet (0.5 mg total) by mouth 2 (two) times daily as needed for anxiety. for anxiety  Dispense: 60 tablet; Refill: 2 - atorvastatin (LIPITOR) 10 MG tablet; Take 1 tablet (10 mg total) by mouth daily.  Dispense: 90 tablet; Refill: 3   General Counseling: Kathy Mccormick verbalizes understanding of the findings of todays visit and agrees with plan of treatment. I have discussed any further diagnostic evaluation that may be needed or ordered today. We also reviewed her medications today. she has been encouraged to call the office with any questions or concerns that should arise related to todays visit.    No orders of the defined types were placed in this encounter.   Meds ordered this encounter  Medications   DISCONTD: Atogepant (QULIPTA) 60 MG TABS    Sig: Take 1 tablet (60 mg total) by mouth daily.    Dispense:  30 tablet    Refill:  5     Dx code G43.E19   Atogepant (QULIPTA) 60 MG TABS    Sig: Take 1 tablet (60 mg total) by mouth daily.    Dispense:  30 tablet    Refill:  5    Dx code G43.E19. PLEASE RESEND PRIOR AUTHORIZATION ASAP. THANKS.   Diethylpropion HCl CR 75 MG TB24    Sig: Take 1 tablet (75 mg total) by mouth daily before breakfast.    Dispense:  30 tablet    Refill:  1    Patient will bring good rx coupon, do not run through her insurance   zolpidem (AMBIEN CR) 12.5 MG CR tablet    Sig: Take 1 tablet (12.5 mg total) by mouth at bedtime as needed for sleep. for sleep    Dispense:  30 tablet    Refill:  2   zolmitriptan (ZOMIG-ZMT) 5 MG disintegrating tablet    Sig: TAKE 1 TABLET BY MOUTH AS NEEDED FOR MIGRAINES. MAY REPEAT AFTER 2-3 HRS. MAX DOSE 2 TABS IN 24 HRS    Dispense:  10 tablet    Refill:  3   fluticasone (FLONASE) 50 MCG/ACT nasal spray    Sig: Place 1 spray into both nostrils daily.    Dispense:  48 g    Refill:  1   ALPRAZolam (XANAX) 0.5 MG tablet    Sig: Take 1 tablet (0.5 mg total) by mouth 2 (two) times  daily as needed for anxiety. for anxiety    Dispense:  60 tablet    Refill:  2   atorvastatin (LIPITOR) 10 MG tablet    Sig: Take 1 tablet (10 mg total) by mouth daily.    Dispense:  90 tablet    Refill:  3    Return in about 3 months (around 04/10/2023) for F/U, anxiety med refill, Jenina Moening PCP and ambien.   Total time spent:30 Minutes Time spent includes review of chart, medications, test results, and follow up plan with the patient.   [place] Controlled Substance Database was reviewed by me.  This patient was seen by Sallyanne Kuster, FNP-C in collaboration with Dr. Beverely Risen as a part of collaborative care agreement.   Savion Washam R. Tedd Sias, MSN, FNP-C Internal medicine

## 2023-01-17 ENCOUNTER — Encounter: Payer: Self-pay | Admitting: Nurse Practitioner

## 2023-01-23 ENCOUNTER — Telehealth: Payer: Self-pay

## 2023-01-23 NOTE — Telephone Encounter (Signed)
PA was done and approved for Qulipta and pt was notified.

## 2023-02-11 ENCOUNTER — Telehealth: Payer: Self-pay

## 2023-02-13 ENCOUNTER — Telehealth: Payer: Self-pay

## 2023-02-13 NOTE — Telephone Encounter (Signed)
Left message for patient to give office a call.

## 2023-02-14 NOTE — Telephone Encounter (Signed)
Patient is in contact with kaley.

## 2023-02-22 ENCOUNTER — Encounter: Payer: Self-pay | Admitting: Nurse Practitioner

## 2023-03-11 ENCOUNTER — Other Ambulatory Visit: Payer: Self-pay | Admitting: Nurse Practitioner

## 2023-03-11 DIAGNOSIS — G43E19 Chronic migraine with aura, intractable, without status migrainosus: Secondary | ICD-10-CM

## 2023-04-10 ENCOUNTER — Ambulatory Visit: Payer: 59 | Admitting: Nurse Practitioner

## 2023-04-18 ENCOUNTER — Encounter: Payer: Self-pay | Admitting: Nurse Practitioner

## 2023-04-18 ENCOUNTER — Ambulatory Visit: Payer: 59 | Admitting: Nurse Practitioner

## 2023-04-18 VITALS — BP 128/74 | HR 72 | Temp 97.6°F | Resp 16 | Ht 63.0 in | Wt 159.2 lb

## 2023-04-18 DIAGNOSIS — F5101 Primary insomnia: Secondary | ICD-10-CM | POA: Diagnosis not present

## 2023-04-18 DIAGNOSIS — Z79899 Other long term (current) drug therapy: Secondary | ICD-10-CM

## 2023-04-18 DIAGNOSIS — G43E19 Chronic migraine with aura, intractable, without status migrainosus: Secondary | ICD-10-CM | POA: Diagnosis not present

## 2023-04-18 DIAGNOSIS — F411 Generalized anxiety disorder: Secondary | ICD-10-CM

## 2023-04-18 DIAGNOSIS — F410 Panic disorder [episodic paroxysmal anxiety] without agoraphobia: Secondary | ICD-10-CM

## 2023-04-18 DIAGNOSIS — B353 Tinea pedis: Secondary | ICD-10-CM

## 2023-04-18 DIAGNOSIS — Z760 Encounter for issue of repeat prescription: Secondary | ICD-10-CM

## 2023-04-18 MED ORDER — ZOLPIDEM TARTRATE ER 12.5 MG PO TBCR
12.5000 mg | EXTENDED_RELEASE_TABLET | Freq: Every evening | ORAL | 2 refills | Status: AC | PRN
Start: 2023-04-18 — End: ?

## 2023-04-18 MED ORDER — ALPRAZOLAM 0.5 MG PO TABS
0.5000 mg | ORAL_TABLET | Freq: Two times a day (BID) | ORAL | 2 refills | Status: DC | PRN
Start: 2023-04-18 — End: 2023-07-15

## 2023-04-18 MED ORDER — CICLOPIROX OLAMINE 0.77 % EX CREA
TOPICAL_CREAM | Freq: Two times a day (BID) | CUTANEOUS | 5 refills | Status: AC
Start: 2023-04-18 — End: ?

## 2023-04-18 MED ORDER — ZOLMITRIPTAN 5 MG PO TBDP
ORAL_TABLET | ORAL | 3 refills | Status: DC
Start: 2023-04-18 — End: 2023-07-15

## 2023-04-18 MED ORDER — BUSPIRONE HCL 5 MG PO TABS
5.0000 mg | ORAL_TABLET | Freq: Two times a day (BID) | ORAL | 0 refills | Status: DC
Start: 2023-04-18 — End: 2023-07-09

## 2023-04-18 NOTE — Progress Notes (Signed)
[facility] [address] [place], [place] [address]  Internal MEDICINE  Office Visit Note  Patient Name: Kathy Mccormick  [id number]  [id number]  Date of Service: 04/18/2023  Chief Complaint  Patient presents with   Gastroesophageal Reflux   Hypertension   Hyperlipidemia   Follow-up    HPI Kathy Mccormick presents for a follow-up visit for Migraines -- decreased frequency with qulipta but not as well as the injections.  Weight loss -- has not been taking the diethylpropion due to starting new migraine medication.  Insomnia -- takes ambien CR Athlete's foot -- uses ciclopirox cream   Current Medication: Outpatient Encounter Medications as of 04/18/2023  Medication Sig   acyclovir (ZOVIRAX) 400 MG tablet TAKE 1 TABLET BY MOUTH TWICE  DAILY   Atogepant (QULIPTA) 60 MG TABS Take 1 tablet (60 mg total) by mouth daily.   atorvastatin (LIPITOR) 10 MG tablet Take 1 tablet (10 mg total) by mouth daily.   Azelaic Acid 15 % gel AFTER SKIN IS THOROUGHLY WASHED AND PATTED DRY, GENTLY BUT THOROUGHLY MASSAGE A THIN FILM TOPICALLY INTO AFFECTED AREA TWICE DAILY (IN THE MORNING AND IN THE EVENING )   calcipotriene (DOVONOX) 0.005 % cream Apply topically 2 (two) times daily. As directed   CHERRY PO Take by mouth.   desonide (DESOWEN) 0.05 % cream APPLY 1 APPLICATION  TOPICALLY 3 TIMES DAILY AS  NEEDED   Diethylpropion HCl CR 75 MG TB24 Take 1 tablet (75 mg total) by mouth daily before breakfast.   doxycycline (VIBRA-TABS) 100 MG tablet TAKE 1 TABLET BY MOUTH DAILY   DULoxetine (CYMBALTA) 60 MG capsule TAKE 1 CAPSULE BY MOUTH 1 TO 2  TIMES DAILY   estradiol (ESTRACE) 0.1 MG/GM vaginal cream Place 1 Applicatorful vaginally 2 (two) times a week.   fluorouracil (EFUDEX) 5 % cream Apply topically 2 (two) times daily. As directed   fluticasone (FLONASE) 50 MCG/ACT nasal spray Place 1 spray into both nostrils daily.   levothyroxine (SYNTHROID) 50 MCG tablet Take 1 tablet (50 mcg total) by mouth  daily before breakfast.   losartan (COZAAR) 100 MG tablet Take 1 tablet (100 mg total) by mouth daily.   meloxicam (MOBIC) 15 MG tablet Take 1 tablet (15 mg total) by mouth daily as needed.   metroNIDAZOLE (METROCREAM) 0.75 % cream Apply topically 2 (two) times daily.   montelukast (SINGULAIR) 10 MG tablet TAKE 1 TABLET BY MOUTH AT  BEDTIME   Multiple Vitamin (MULTIVITAMIN) tablet Take 1 tablet by mouth daily.   omeprazole (PRILOSEC) 40 MG capsule Take 1 capsule (40 mg total) by mouth daily.   triamcinolone (KENALOG) 0.025 % ointment Twice daily for up to 1 week for "cool down" to face   triamcinolone cream (KENALOG) 0.1 % Apply 1 Application topically 2 (two) times daily.   triamcinolone ointment (KENALOG) 0.1 % APPLY TWICE A DAY FOR UP TO 1 WEEK FOR "COOL DOWN" TO BODY   [DISCONTINUED] ALPRAZolam (XANAX) 0.5 MG tablet Take 1 tablet (0.5 mg total) by mouth 2 (two) times daily as needed for anxiety. for anxiety   [DISCONTINUED] busPIRone (BUSPAR) 5 MG tablet TAKE 1 TO 2 TABLETS BY MOUTH  TWICE DAILY   [DISCONTINUED] ciclopirox (LOPROX) 0.77 % cream Apply topically 2 (two) times daily.   [DISCONTINUED] zolmitriptan (ZOMIG-ZMT) 5 MG disintegrating tablet TAKE 1 TABLET BY MOUTH AS NEEDED FOR MIGRAINES. MAY REPEAT AFTER 2-3 HRS. MAX DOSE 2 TABS IN 24 HRS   [DISCONTINUED] zolpidem (AMBIEN CR) 12.5 MG CR tablet Take  1 tablet (12.5 mg total) by mouth at bedtime as needed for sleep. for sleep   ALPRAZolam (XANAX) 0.5 MG tablet Take 1 tablet (0.5 mg total) by mouth 2 (two) times daily as needed for anxiety. for anxiety   busPIRone (BUSPAR) 5 MG tablet Take 1-2 tablets (5-10 mg total) by mouth 2 (two) times daily.   ciclopirox (LOPROX) 0.77 % cream Apply topically 2 (two) times daily. To feet until resolved.   zolmitriptan (ZOMIG-ZMT) 5 MG disintegrating tablet TAKE 1 TABLET BY MOUTH AS NEEDED FOR MIGRAINES. MAY REPEAT AFTER 2-3 HRS. MAX DOSE 2 TABS IN 24 HRS   zolpidem (AMBIEN CR) 12.5 MG CR tablet Take 1  tablet (12.5 mg total) by mouth at bedtime as needed for sleep. for sleep   No facility-administered encounter medications on file as of 04/18/2023.    Surgical History: Past Surgical History:  Procedure Laterality Date   BACK SURGERY     BREAST BIOPSY Right 11/15/2021   MM RT BREAST BX W LOC DEV 1ST LESION IMAGE BX SPEC STEREO GUIDE 11/15/2021 [facility]   CARPAL TUNNEL RELEASE     CARPAL TUNNEL RELEASE Bilateral 2005   CHOLECYSTECTOMY     COLPOSCOPY     OOPHORECTOMY     RSO,LSO   PELVIC LAPAROSCOPY  2001,2004   DL X 2 RSO and LSO   TONSILLECTOMY     TRIGGER FINGER RELEASE Right 08/24/2021   VAGINAL HYSTERECTOMY  2000   endometriosis    Medical History: Past Medical History:  Diagnosis Date   Allergy    environmental   Anxiety    CIN I (cervical intraepithelial neoplasia I)    Endometriosis    GERD (gastroesophageal reflux disease)    History of actinic keratoses    Hyperlipidemia    Hypertension    Migraine    Migraines    Neck pain    STD (sexually transmitted disease)    HSV ll    Family History: Family History  Problem Relation Age of Onset   Hypertension Father    Diabetes Mother    Hypertension Mother    Uterine cancer Mother    Liver disease Mother    Hypertension Brother    Heart Problems Brother    Breast cancer Maternal Aunt        Age 55's   Aneurysm Maternal Aunt     Social History   Socioeconomic History   Marital status: Married    Spouse name: Not on file   Number of children: Not on file   Years of education: Not on file   Highest education level: Not on file  Occupational History   Not on file  Tobacco Use   Smoking status: Former   Smokeless tobacco: Never  Vaping Use   Vaping status: Never Used  Substance and Sexual Activity   Alcohol use: No    Alcohol/week: 0.0 standard drinks of alcohol   Drug use: No   Sexual activity: Not Currently    Birth control/protection: Surgical, Post-menopausal    Comment: Hyst;  First IC @ 75 y/o, >5 Partners, No DES exposure  Other Topics Concern   Not on file  Social History Narrative   Not on file   Social Determinants of Health   Financial Resource Strain: Not on file  Food Insecurity: Not on file  Transportation Needs: Not on file  Physical Activity: Not on file  Stress: Not on file  Social Connections: Not on file  Intimate Partner Violence: Not  on file      Review of Systems  Constitutional:  Negative for chills, fatigue and unexpected weight change.  HENT:  Negative for congestion, rhinorrhea, sneezing and sore throat.   Eyes:  Negative for redness.  Respiratory: Negative.  Negative for cough, chest tightness, shortness of breath and wheezing.   Cardiovascular: Negative.  Negative for chest pain and palpitations.  Gastrointestinal:  Positive for nausea. Negative for abdominal pain, constipation, diarrhea and vomiting.  Genitourinary:  Negative for dysuria and frequency.  Musculoskeletal:  Positive for arthralgias. Negative for joint swelling and neck pain.  Skin:  Negative for rash.  Neurological:  Positive for weakness and headaches. Negative for tremors and numbness.  Hematological:  Negative for adenopathy. Does not bruise/bleed easily.  Psychiatric/Behavioral:  Negative for behavioral problems (Depression), sleep disturbance and suicidal ideas. The patient is not nervous/anxious.     Vital Signs: BP 128/74   Pulse 72   Temp 97.6 F (36.4 C)   Resp 16   Ht 5\' 3"  (1.6 m)   Wt 159 lb 3.2 oz (72.2 kg)   SpO2 99%   BMI 28.20 kg/m    Physical Exam Vitals reviewed.  Constitutional:      General: She is not in acute distress.    Appearance: Normal appearance. She is normal weight. She is not ill-appearing.  HENT:     Head: Normocephalic and atraumatic.  Eyes:     Pupils: Pupils are equal, round, and reactive to light.  Cardiovascular:     Rate and Rhythm: Normal rate and regular rhythm.  Pulmonary:     Effort: Pulmonary effort  is normal. No respiratory distress.  Neurological:     Mental Status: She is alert and oriented to person, place, and time.     Cranial Nerves: No cranial nerve deficit.     Gait: Gait normal.  Psychiatric:        Mood and Affect: Mood normal.        Behavior: Behavior normal.        Assessment/Plan: 1. Intractable chronic migraine with aura and without status migrainosus Continue qulipta and zomig as prescribed.  - zolmitriptan (ZOMIG-ZMT) 5 MG disintegrating tablet; TAKE 1 TABLET BY MOUTH AS NEEDED FOR MIGRAINES. MAY REPEAT AFTER 2-3 HRS. MAX DOSE 2 TABS IN 24 HRS  Dispense: 10 tablet; Refill: 3  2. Tinea pedis of both feet Continue using ciclopirox as needed  - ciclopirox (LOPROX) 0.77 % cream; Apply topically 2 (two) times daily. To feet until resolved.  Dispense: 90 g; Refill: 5  3. Primary insomnia Continue ambien as prescribed, follow up in 3 months for additional refills - zolpidem (AMBIEN CR) 12.5 MG CR tablet; Take 1 tablet (12.5 mg total) by mouth at bedtime as needed for sleep. for sleep  Dispense: 30 tablet; Refill: 2  4. Generalized anxiety disorder with panic attacks Continue buspirone and prn alprazolam as prescribed  - ALPRAZolam (XANAX) 0.5 MG tablet; Take 1 tablet (0.5 mg total) by mouth 2 (two) times daily as needed for anxiety. for anxiety  Dispense: 60 tablet; Refill: 2 - busPIRone (BUSPAR) 5 MG tablet; Take 1-2 tablets (5-10 mg total) by mouth 2 (two) times daily.  Dispense: 120 tablet; Refill: 0   General Counseling: Kathy Mccormick verbalizes understanding of the findings of todays visit and agrees with plan of treatment. I have discussed any further diagnostic evaluation that may be needed or ordered today. We also reviewed her medications today. she has been encouraged to call the office with  any questions or concerns that should arise related to todays visit.    No orders of the defined types were placed in this encounter.   Meds ordered this encounter   Medications   ciclopirox (LOPROX) 0.77 % cream    Sig: Apply topically 2 (two) times daily. To feet until resolved.    Dispense:  90 g    Refill:  5   zolpidem (AMBIEN CR) 12.5 MG CR tablet    Sig: Take 1 tablet (12.5 mg total) by mouth at bedtime as needed for sleep. for sleep    Dispense:  30 tablet    Refill:  2   ALPRAZolam (XANAX) 0.5 MG tablet    Sig: Take 1 tablet (0.5 mg total) by mouth 2 (two) times daily as needed for anxiety. for anxiety    Dispense:  60 tablet    Refill:  2   zolmitriptan (ZOMIG-ZMT) 5 MG disintegrating tablet    Sig: TAKE 1 TABLET BY MOUTH AS NEEDED FOR MIGRAINES. MAY REPEAT AFTER 2-3 HRS. MAX DOSE 2 TABS IN 24 HRS    Dispense:  10 tablet    Refill:  3   busPIRone (BUSPAR) 5 MG tablet    Sig: Take 1-2 tablets (5-10 mg total) by mouth 2 (two) times daily.    Dispense:  120 tablet    Refill:  0    Return in about 3 months (around 07/10/2023) for F/U, anxiety med refill, Kathy Mccormick PCP alprazolam and ambien.   Total time spent:30 Minutes Time spent includes review of chart, medications, test results, and follow up plan with the patient.   [place] Controlled Substance Database was reviewed by me.  This patient was seen by Sallyanne Kuster, FNP-C in collaboration with Dr. Beverely Risen as a part of collaborative care agreement.   Lucylle Foulkes R. Tedd Sias, MSN, FNP-C Internal medicine

## 2023-04-19 ENCOUNTER — Encounter: Payer: Self-pay | Admitting: Nurse Practitioner

## 2023-06-20 ENCOUNTER — Other Ambulatory Visit: Payer: Self-pay | Admitting: Nurse Practitioner

## 2023-06-20 DIAGNOSIS — Z760 Encounter for issue of repeat prescription: Secondary | ICD-10-CM

## 2023-07-04 ENCOUNTER — Other Ambulatory Visit: Payer: Self-pay | Admitting: Nurse Practitioner

## 2023-07-04 DIAGNOSIS — Z760 Encounter for issue of repeat prescription: Secondary | ICD-10-CM

## 2023-07-04 DIAGNOSIS — F410 Panic disorder [episodic paroxysmal anxiety] without agoraphobia: Secondary | ICD-10-CM

## 2023-07-04 NOTE — Telephone Encounter (Signed)
 Please review

## 2023-07-15 ENCOUNTER — Ambulatory Visit: Payer: 59 | Admitting: Nurse Practitioner

## 2023-07-15 ENCOUNTER — Encounter: Payer: Self-pay | Admitting: Nurse Practitioner

## 2023-07-15 VITALS — BP 138/86 | HR 74 | Temp 97.0°F | Resp 16 | Ht 63.0 in | Wt 162.6 lb

## 2023-07-15 DIAGNOSIS — N952 Postmenopausal atrophic vaginitis: Secondary | ICD-10-CM | POA: Diagnosis not present

## 2023-07-15 DIAGNOSIS — F411 Generalized anxiety disorder: Secondary | ICD-10-CM | POA: Diagnosis not present

## 2023-07-15 DIAGNOSIS — G43E19 Chronic migraine with aura, intractable, without status migrainosus: Secondary | ICD-10-CM | POA: Diagnosis not present

## 2023-07-15 DIAGNOSIS — F410 Panic disorder [episodic paroxysmal anxiety] without agoraphobia: Secondary | ICD-10-CM

## 2023-07-15 MED ORDER — ZOLMITRIPTAN 5 MG PO TBDP: ORAL_TABLET | ORAL | 3 refills | Status: DC

## 2023-07-15 MED ORDER — EMGALITY 120 MG/ML ~~LOC~~ SOAJ: 120.0000 mg | SUBCUTANEOUS | 5 refills | Status: DC

## 2023-07-15 MED ORDER — ALPRAZOLAM 0.5 MG PO TABS: 0.5000 mg | ORAL_TABLET | Freq: Two times a day (BID) | ORAL | 2 refills | Status: DC | PRN

## 2023-07-15 NOTE — Progress Notes (Signed)
 [facility] [address] [place], [place] [address]  Internal MEDICINE  Office Visit Note  Patient Name: Kathy Mccormick  [id number]  [id number]  Date of Service: 07/15/2023  Chief Complaint  Patient presents with   Gastroesophageal Reflux   Hypertension   Hyperlipidemia   Follow-up    HPI Kathy Mccormick presents for a follow-up visit for migraines, anxiety and atrophic vaginitis.  Migraines -- has been taking qulipta  every day and this medication has been causing her to feel very fatigued, has been having 4-5 migraines per week despite taking qulipta . Has needed to take zolmitriptan  more often to help with the migraines.  Has tried qulipta , emgality , ajovy, aimovig , rizatriptan, zolmitriptan , ubrelvy , topiramate, propranolol, amitriptyline, sumitriptan and nurtec. Emgality  was always the most effective medication in preventing migraines for the patient.  Tried qulipta  most recently and it does not prevent her migraines as well as the monthly injectable medications.  She has chronic migraines with aura.  Approx 16-20 migraine days per month. Her aura consists of numbness to the right side of the face, difficulty speaking, motor weakness in her area and/or seeing flickering lights and spots.  Her migraines last for several hours, are unilateral, pulsating and start behind her right or left eye. She also has nausea, photophobia and phonophobia with her migraines and movement makes the pain worse.  Anxiety -- taking alprazolam  as needed.  Atrophic vaginitis -- estradiol  vaginal cream is helping.    Current Medication: Outpatient Encounter Medications as of 07/15/2023  Medication Sig   acyclovir  (ZOVIRAX ) 400 MG tablet TAKE 1 TABLET BY MOUTH TWICE  DAILY   Atogepant  (QULIPTA ) 60 MG TABS Take 1 tablet (60 mg total) by mouth daily.   atorvastatin  (LIPITOR) 10 MG tablet Take 1 tablet (10 mg total) by mouth daily.   Azelaic Acid  15 % gel AFTER SKIN IS THOROUGHLY WASHED  AND PATTED DRY,  GENTLY BUT  THOROUGHLY MASSAGE A THIN FILM  TOPICALLY INTO AFFECTED AREA  TWICE DAILY (MORNING / EVENING)   busPIRone  (BUSPAR ) 5 MG tablet TAKE 1 TO 2 TABLETS BY MOUTH  TWICE DAILY   calcipotriene  (DOVONOX) 0.005 % cream Apply topically 2 (two) times daily. As directed   CHERRY PO Take by mouth.   ciclopirox  (LOPROX ) 0.77 % cream Apply topically 2 (two) times daily. To feet until resolved.   desonide  (DESOWEN ) 0.05 % cream APPLY 1 APPLICATION TOPICALLY 3  TIMES DAILY AS NEEDED   doxycycline  (VIBRA -TABS) 100 MG tablet TAKE 1 TABLET BY MOUTH DAILY   DULoxetine  (CYMBALTA ) 60 MG capsule TAKE 1 CAPSULE BY MOUTH 1 TO 2  TIMES DAILY   estradiol  (ESTRACE ) 0.1 MG/GM vaginal cream Place 1 Applicatorful vaginally 2 (two) times a week.   fluorouracil  (EFUDEX ) 5 % cream Apply topically 2 (two) times daily. As directed   fluticasone  (FLONASE ) 50 MCG/ACT nasal spray Place 1 spray into both nostrils daily.   Galcanezumab -gnlm (EMGALITY ) 120 MG/ML SOAJ Inject 120 mg into the skin every 30 (thirty) days.   levothyroxine  (SYNTHROID ) 50 MCG tablet TAKE 1 TABLET BY MOUTH DAILY  BEFORE BREAKFAST   losartan  (COZAAR ) 100 MG tablet TAKE 1 TABLET BY MOUTH DAILY   meloxicam  (MOBIC ) 15 MG tablet TAKE 1 TABLET BY MOUTH DAILY AS  NEEDED   metroNIDAZOLE  (METROCREAM ) 0.75 % cream Apply topically 2 (two) times daily.   montelukast  (SINGULAIR ) 10 MG tablet TAKE 1 TABLET BY MOUTH AT  BEDTIME   Multiple Vitamin (MULTIVITAMIN) tablet Take 1 tablet by mouth daily.   omeprazole  (  PRILOSEC) 40 MG capsule TAKE 1 CAPSULE BY MOUTH DAILY   triamcinolone  (KENALOG ) 0.025 % ointment Twice daily for up to 1 week for cool down to face   triamcinolone  cream (KENALOG ) 0.1 % Apply 1 Application topically 2 (two) times daily.   triamcinolone  ointment (KENALOG ) 0.1 % APPLY TWICE A DAY FOR UP TO 1 WEEK FOR COOL DOWN TO BODY   zolpidem  (AMBIEN  CR) 12.5 MG CR tablet Take 1 tablet (12.5 mg total) by mouth at bedtime as needed for sleep. for sleep    [DISCONTINUED] ALPRAZolam  (XANAX ) 0.5 MG tablet Take 1 tablet (0.5 mg total) by mouth 2 (two) times daily as needed for anxiety. for anxiety   [DISCONTINUED] Diethylpropion  HCl CR 75 MG TB24 Take 1 tablet (75 mg total) by mouth daily before breakfast.   [DISCONTINUED] zolmitriptan  (ZOMIG -ZMT) 5 MG disintegrating tablet TAKE 1 TABLET BY MOUTH AS NEEDED FOR MIGRAINES. MAY REPEAT AFTER 2-3 HRS. MAX DOSE 2 TABS IN 24 HRS   ALPRAZolam  (XANAX ) 0.5 MG tablet Take 1 tablet (0.5 mg total) by mouth 2 (two) times daily as needed for anxiety. for anxiety   zolmitriptan  (ZOMIG -ZMT) 5 MG disintegrating tablet TAKE 1 TABLET BY MOUTH AS NEEDED FOR MIGRAINES. MAY REPEAT AFTER 2-3 HRS. MAX DOSE 2 TABS IN 24 HRS   No facility-administered encounter medications on file as of 07/15/2023.    Surgical History: Past Surgical History:  Procedure Laterality Date   BACK SURGERY     BREAST BIOPSY Right 11/15/2021   MM RT BREAST BX W LOC DEV 1ST LESION IMAGE BX SPEC STEREO GUIDE 11/15/2021 [facility]   CARPAL TUNNEL RELEASE     CARPAL TUNNEL RELEASE Bilateral 2005   CHOLECYSTECTOMY     COLPOSCOPY     OOPHORECTOMY     RSO,LSO   PELVIC LAPAROSCOPY  2001,2004   DL X 2 RSO and LSO   TONSILLECTOMY     TRIGGER FINGER RELEASE Right 08/24/2021   VAGINAL HYSTERECTOMY  2000   endometriosis    Medical History: Past Medical History:  Diagnosis Date   Allergy    environmental   Anxiety    CIN I (cervical intraepithelial neoplasia I)    Endometriosis    GERD (gastroesophageal reflux disease)    History of actinic keratoses    Hyperlipidemia    Hypertension    Migraine    Migraines    Neck pain    STD (sexually transmitted disease)    HSV ll    Family History: Family History  Problem Relation Age of Onset   Hypertension Father    Diabetes Mother    Hypertension Mother    Uterine cancer Mother    Liver disease Mother    Hypertension Brother    Heart Problems Brother    Breast cancer Maternal  Aunt        Age 74's   Aneurysm Maternal Aunt     Social History   Socioeconomic History   Marital status: Married    Spouse name: Not on file   Number of children: Not on file   Years of education: Not on file   Highest education level: Not on file  Occupational History   Not on file  Tobacco Use   Smoking status: Former   Smokeless tobacco: Never  Vaping Use   Vaping status: Never Used  Substance and Sexual Activity   Alcohol use: No    Alcohol/week: 0.0 standard drinks of alcohol   Drug use: No   Sexual activity:  Not Currently    Birth control/protection: Surgical, Post-menopausal    Comment: Hyst; First IC @ 64 y/o, >5 Partners, No DES exposure  Other Topics Concern   Not on file  Social History Narrative   Not on file   Social Drivers of Health   Financial Resource Strain: Not on file  Food Insecurity: Not on file  Transportation Needs: Not on file  Physical Activity: Not on file  Stress: Not on file  Social Connections: Not on file  Intimate Partner Violence: Not on file      Review of Systems  Constitutional:  Negative for chills, fatigue and unexpected weight change.  HENT:  Negative for congestion, rhinorrhea, sneezing and sore throat.   Eyes:  Negative for redness.  Respiratory: Negative.  Negative for cough, chest tightness, shortness of breath and wheezing.   Cardiovascular: Negative.  Negative for chest pain and palpitations.  Gastrointestinal:  Positive for nausea. Negative for abdominal pain, constipation, diarrhea and vomiting.  Genitourinary:  Negative for dysuria and frequency.  Musculoskeletal:  Positive for arthralgias. Negative for joint swelling and neck pain.  Skin:  Negative for rash.  Neurological:  Positive for weakness and headaches. Negative for tremors and numbness.  Hematological:  Negative for adenopathy. Does not bruise/bleed easily.  Psychiatric/Behavioral:  Negative for behavioral problems (Depression), self-injury, sleep  disturbance and suicidal ideas. The patient is nervous/anxious.     Vital Signs: BP 138/86   Pulse 74   Temp (!) 97 F (36.1 C)   Resp 16   Ht 5' 3 (1.6 m)   Wt 162 lb 9.6 oz (73.8 kg)   SpO2 98%   BMI 28.80 kg/m    Physical Exam Vitals reviewed.  Constitutional:      General: She is not in acute distress.    Appearance: Normal appearance. She is normal weight. She is not ill-appearing.  HENT:     Head: Normocephalic and atraumatic.  Eyes:     Pupils: Pupils are equal, round, and reactive to light.  Cardiovascular:     Rate and Rhythm: Normal rate and regular rhythm.  Pulmonary:     Effort: Pulmonary effort is normal. No respiratory distress.  Neurological:     Mental Status: She is alert and oriented to person, place, and time.     Cranial Nerves: No cranial nerve deficit.     Gait: Gait normal.  Psychiatric:        Mood and Affect: Mood normal.        Behavior: Behavior normal.        Assessment/Plan: 1. Intractable chronic migraine with aura and without status migrainosus (Primary) Continue zolmitriptan  for acute migraines as needed. Emgality  prescribed, will try to get approved or may need to try aimovig  instead. - Galcanezumab -gnlm (EMGALITY ) 120 MG/ML SOAJ; Inject 120 mg into the skin every 30 (thirty) days.  Dispense: 1.12 mL; Refill: 5 - zolmitriptan  (ZOMIG -ZMT) 5 MG disintegrating tablet; TAKE 1 TABLET BY MOUTH AS NEEDED FOR MIGRAINES. MAY REPEAT AFTER 2-3 HRS. MAX DOSE 2 TABS IN 24 HRS  Dispense: 10 tablet; Refill: 3  2. Atrophic vaginitis Continue vaginal estradiol  cream as prescribed.   3. Generalized anxiety disorder with panic attacks Continue prn alprazolam  as prescribed.  - ALPRAZolam  (XANAX ) 0.5 MG tablet; Take 1 tablet (0.5 mg total) by mouth 2 (two) times daily as needed for anxiety. for anxiety  Dispense: 60 tablet; Refill: 2   General Counseling: Yarelie verbalizes understanding of the findings of todays visit and agrees with  plan of  treatment. I have discussed any further diagnostic evaluation that may be needed or ordered today. We also reviewed her medications today. she has been encouraged to call the office with any questions or concerns that should arise related to todays visit.    No orders of the defined types were placed in this encounter.   Meds ordered this encounter  Medications   Galcanezumab -gnlm (EMGALITY ) 120 MG/ML SOAJ    Sig: Inject 120 mg into the skin every 30 (thirty) days.    Dispense:  1.12 mL    Refill:  5    Chronic migraines, please send prior authorization for this medication asap to fax #[phone] or via [email or web address]. qulipta  is not helping and we need to switch medications for prevention   ALPRAZolam  (XANAX ) 0.5 MG tablet    Sig: Take 1 tablet (0.5 mg total) by mouth 2 (two) times daily as needed for anxiety. for anxiety    Dispense:  60 tablet    Refill:  2   zolmitriptan  (ZOMIG -ZMT) 5 MG disintegrating tablet    Sig: TAKE 1 TABLET BY MOUTH AS NEEDED FOR MIGRAINES. MAY REPEAT AFTER 2-3 HRS. MAX DOSE 2 TABS IN 24 HRS    Dispense:  10 tablet    Refill:  3    Return in about 3 months (around 10/08/2023) for F/U, anxiety med refill, Laquanda Bick PCP.   Total time spent:30 Minutes Time spent includes review of chart, medications, test results, and follow up plan with the patient.   [place] Controlled Substance Database was reviewed by me.  This patient was seen by Mardy Maxin, FNP-C in collaboration with Dr. Sigrid Bathe as a part of collaborative care agreement.   Danniell Rotundo R. Maxin, MSN, FNP-C Internal medicine

## 2023-07-16 ENCOUNTER — Telehealth: Payer: Self-pay

## 2023-07-17 MED ORDER — AIMOVIG 140 MG/ML ~~LOC~~ SOAJ: 140.0000 mg | SUBCUTANEOUS | 5 refills | Status: DC

## 2023-07-17 NOTE — Telephone Encounter (Signed)
 Pt advised we sent med

## 2023-07-24 ENCOUNTER — Telehealth: Payer: Self-pay

## 2023-07-24 DIAGNOSIS — L500 Allergic urticaria: Secondary | ICD-10-CM

## 2023-07-24 NOTE — Telephone Encounter (Signed)
Pt advised its lab order placed

## 2023-07-27 LAB — IGE FOOD W/COMPONENT REFLEX II
Allergen Corn, IgE: <0.1 kU/L
Clam IgE: <0.1 kU/L
Codfish IgE: <0.1 kU/L
F001-IgE Egg White: <0.1 kU/L
F002-IgE Milk: <0.1 kU/L
F017-IgE Hazelnut (Filbert): <0.1 kU/L
F018-IgE Brazil Nut: <0.1 kU/L
F020-IgE Almond: <0.1 kU/L
F202-IgE Cashew Nut: <0.1 kU/L
F203-IgE Pistachio Nut: <0.1 kU/L
F256-IgE Walnut: <0.1 kU/L
Macadamia Nut, IgE: <0.1 kU/L
Peanut, IgE: <0.1 kU/L
Pecan Nut IgE: <0.1 kU/L
Scallop IgE: <0.1 kU/L
Sesame Seed IgE: <0.1 kU/L
Shrimp IgE: <0.1 kU/L
Soybean IgE: <0.1 kU/L
Wheat IgE: <0.1 kU/L

## 2023-08-06 ENCOUNTER — Encounter: Payer: Self-pay | Admitting: Nurse Practitioner

## 2023-08-14 ENCOUNTER — Other Ambulatory Visit: Payer: Self-pay | Admitting: Nurse Practitioner

## 2023-08-14 DIAGNOSIS — L500 Allergic urticaria: Secondary | ICD-10-CM

## 2023-08-14 DIAGNOSIS — L299 Pruritus, unspecified: Secondary | ICD-10-CM

## 2023-08-14 DIAGNOSIS — J3089 Other allergic rhinitis: Secondary | ICD-10-CM

## 2023-08-15 ENCOUNTER — Ambulatory Visit (INDEPENDENT_AMBULATORY_CARE_PROVIDER_SITE_OTHER): Payer: 59 | Admitting: Physician Assistant

## 2023-08-15 ENCOUNTER — Encounter: Payer: Self-pay | Admitting: Physician Assistant

## 2023-08-15 VITALS — BP 130/85 | HR 71 | Temp 98.2°F | Resp 16 | Ht 63.0 in | Wt 164.4 lb

## 2023-08-15 DIAGNOSIS — T7840XD Allergy, unspecified, subsequent encounter: Secondary | ICD-10-CM

## 2023-08-15 MED ORDER — METHYLPREDNISOLONE ACETATE 40 MG/ML IJ SUSP
40.0000 mg | Freq: Once | INTRAMUSCULAR | Status: AC
Start: 2023-08-15 — End: 2023-08-15
  Administered 2023-08-15: 40 mg via INTRAMUSCULAR

## 2023-08-15 NOTE — Progress Notes (Signed)
 [facility] [address] [place], [place] [address]  Internal MEDICINE  Office Visit Note  Patient Name: Kathy Mccormick  [id number]  [id number]  Date of Service: 08/21/2023  Chief Complaint  Patient presents with   Acute Visit   Allergic Reaction     HPI Pt is here for a sick visit. -Had allergy testing back in Jan that did not reveal anything, had additional allergy labs drawn yesterday--results expected in a few days -She first had allergy reaction last April possibly to peanut butter and was treated with oral steroid and injection -This past Jan had hives and went to [facility] who gave her prednisone and also took benadryl. They gave her an epipen -But night before last she was eating grapes and shortly after developed itching and tongue swelling and took epi pen and called 911 to get checked out. They evaluated her and she did not go to ED. Since then has been taking more benadryl yesterday. Swelling resolved -hands and skin feel sensitive/itchy still, Some stinging/burning feeling still -No SOB, or trouble swallowing -she has already been referred to allergist  Current Medication:  Outpatient Encounter Medications as of 08/15/2023  Medication Sig   acyclovir (ZOVIRAX) 400 MG tablet TAKE 1 TABLET BY MOUTH TWICE  DAILY   ALPRAZolam (XANAX) 0.5 MG tablet Take 1 tablet (0.5 mg total) by mouth 2 (two) times daily as needed for anxiety. for anxiety   Atogepant (QULIPTA) 60 MG TABS Take 1 tablet (60 mg total) by mouth daily.   atorvastatin (LIPITOR) 10 MG tablet Take 1 tablet (10 mg total) by mouth daily.   Azelaic Acid 15 % gel AFTER SKIN IS THOROUGHLY WASHED  AND PATTED DRY, GENTLY BUT  THOROUGHLY MASSAGE A THIN FILM  TOPICALLY INTO AFFECTED AREA  TWICE DAILY (MORNING / EVENING)   busPIRone (BUSPAR) 5 MG tablet TAKE 1 TO 2 TABLETS BY MOUTH  TWICE DAILY   calcipotriene (DOVONOX) 0.005 % cream Apply topically 2 (two) times daily. As directed   CHERRY PO Take by mouth.    ciclopirox (LOPROX) 0.77 % cream Apply topically 2 (two) times daily. To feet until resolved.   desonide (DESOWEN) 0.05 % cream APPLY 1 APPLICATION TOPICALLY 3  TIMES DAILY AS NEEDED   doxycycline (VIBRA-TABS) 100 MG tablet TAKE 1 TABLET BY MOUTH DAILY   DULoxetine (CYMBALTA) 60 MG capsule TAKE 1 CAPSULE BY MOUTH 1 TO 2  TIMES DAILY   Erenumab-aooe (AIMOVIG) 140 MG/ML SOAJ Inject 140 mg into the skin every 30 (thirty) days.   estradiol (ESTRACE) 0.1 MG/GM vaginal cream Place 1 Applicatorful vaginally 2 (two) times a week.   fluorouracil (EFUDEX) 5 % cream Apply topically 2 (two) times daily. As directed   fluticasone (FLONASE) 50 MCG/ACT nasal spray Place 1 spray into both nostrils daily.   Galcanezumab-gnlm (EMGALITY) 120 MG/ML SOAJ Inject 120 mg into the skin every 30 (thirty) days.   levothyroxine (SYNTHROID) 50 MCG tablet TAKE 1 TABLET BY MOUTH DAILY  BEFORE BREAKFAST   losartan (COZAAR) 100 MG tablet TAKE 1 TABLET BY MOUTH DAILY   meloxicam (MOBIC) 15 MG tablet TAKE 1 TABLET BY MOUTH DAILY AS  NEEDED   montelukast (SINGULAIR) 10 MG tablet TAKE 1 TABLET BY MOUTH AT  BEDTIME   Multiple Vitamin (MULTIVITAMIN) tablet Take 1 tablet by mouth daily.   omeprazole (PRILOSEC) 40 MG capsule TAKE 1 CAPSULE BY MOUTH DAILY   triamcinolone (KENALOG) 0.025 % ointment Twice daily for up to 1 week for "cool down" to  face   triamcinolone cream (KENALOG) 0.1 % Apply 1 Application topically 2 (two) times daily.   triamcinolone ointment (KENALOG) 0.1 % APPLY TWICE A DAY FOR UP TO 1 WEEK FOR "COOL DOWN" TO BODY   zolmitriptan (ZOMIG-ZMT) 5 MG disintegrating tablet TAKE 1 TABLET BY MOUTH AS NEEDED FOR MIGRAINES. MAY REPEAT AFTER 2-3 HRS. MAX DOSE 2 TABS IN 24 HRS   zolpidem (AMBIEN CR) 12.5 MG CR tablet Take 1 tablet (12.5 mg total) by mouth at bedtime as needed for sleep. for sleep   [EXPIRED] methylPREDNISolone acetate (DEPO-MEDROL) injection 40 mg    No facility-administered encounter medications on file as of  08/15/2023.      Medical History: Past Medical History:  Diagnosis Date   Allergy    environmental   Anxiety    CIN I (cervical intraepithelial neoplasia I)    Endometriosis    GERD (gastroesophageal reflux disease)    History of actinic keratoses    Hyperlipidemia    Hypertension    Migraine    Migraines    Neck pain    STD (sexually transmitted disease)    HSV ll     Vital Signs: BP 130/85   Pulse 71   Temp 98.2 F (36.8 C)   Resp 16   Ht 5\' 3"  (1.6 m)   Wt 164 lb 6.4 oz (74.6 kg)   SpO2 95%   BMI 29.12 kg/m    Review of Systems  Constitutional:  Negative for fatigue and fever.  HENT:  Negative for congestion, facial swelling, mouth sores, postnasal drip and trouble swallowing.   Respiratory:  Negative for cough, chest tightness, shortness of breath and wheezing.   Cardiovascular:  Negative for chest pain.  Genitourinary:  Negative for flank pain.  Skin:  Negative for color change and rash.       Itchy/tingly/burning sensation   Psychiatric/Behavioral: Negative.      Physical Exam Vitals and nursing note reviewed.  Constitutional:      General: She is not in acute distress.    Appearance: Normal appearance. She is normal weight. She is not ill-appearing.  HENT:     Head: Normocephalic and atraumatic.     Mouth/Throat:     Pharynx: No posterior oropharyngeal erythema.  Eyes:     Pupils: Pupils are equal, round, and reactive to light.  Cardiovascular:     Rate and Rhythm: Normal rate and regular rhythm.  Pulmonary:     Effort: Pulmonary effort is normal. No respiratory distress.  Musculoskeletal:        General: No swelling.  Skin:    Findings: No rash.  Neurological:     Mental Status: She is alert and oriented to person, place, and time.  Psychiatric:        Mood and Affect: Mood normal.        Behavior: Behavior normal.       Assessment/Plan: 1. Allergic reaction, subsequent encounter (Primary) Will treat with 40mg  depo-medrol  injection to help with residual itching from allergic reaction. Denies any swelling, SOB, or trouble swallowing at this time. Awaiting allergy test results and will be establishing with allergist. Advised to go to ED if any new or worsening symptoms arise - methylPREDNISolone acetate (DEPO-MEDROL) injection 40 mg   General Counseling: Sarinah verbalizes understanding of the findings of todays visit and agrees with plan of treatment. I have discussed any further diagnostic evaluation that may be needed or ordered today. We also reviewed her medications today. she has  been encouraged to call the office with any questions or concerns that should arise related to todays visit.    Counseling:    No orders of the defined types were placed in this encounter.   Meds ordered this encounter  Medications   methylPREDNISolone acetate (DEPO-MEDROL) injection 40 mg    Time spent:25 Minutes

## 2023-08-16 ENCOUNTER — Telehealth: Payer: Self-pay | Admitting: Nurse Practitioner

## 2023-08-16 NOTE — Telephone Encounter (Signed)
Awaiting 08/15/23 office notes for Allergy referral-Toni

## 2023-08-17 LAB — ALLERGENS (94) FOODS
Allergen Apple, IgE: <0.1 kU/L
Allergen Banana IgE: 0.13 kU/L — AB
Allergen Barley IgE: <0.1 kU/L
Allergen Black Pepper IgE: <0.1 kU/L
Allergen Blueberry IgE: <0.1 kU/L
Allergen Broccoli: <0.1 kU/L
Allergen Cabbage IgE: <0.1 kU/L
Allergen Carrot IgE: <0.1 kU/L
Allergen Cauliflower IgE: <0.1 kU/L
Allergen Celery IgE: <0.1 kU/L
Allergen Cinnamon IgE: <0.1 kU/L
Allergen Coconut IgE: <0.1 kU/L
Allergen Corn, IgE: <0.1 kU/L
Allergen Cucumber IgE: <0.1 kU/L
Allergen Garlic IgE: <0.1 kU/L
Allergen Ginger IgE: <0.1 kU/L
Allergen Gluten IgE: <0.1 kU/L
Allergen Grape IgE: <0.1 kU/L
Allergen Grapefruit IgE: <0.1 kU/L
Allergen Green Bean IgE: <0.1 kU/L
Allergen Green Pea IgE: <0.1 kU/L
Allergen Lamb IgE: <0.1 kU/L
Allergen Lettuce IgE: <0.1 kU/L
Allergen Lime IgE: <0.1 kU/L
Allergen Melon IgE: <0.1 kU/L
Allergen Oat IgE: <0.1 kU/L
Allergen Onion IgE: <0.1 kU/L
Allergen Pear IgE: <0.1 kU/L
Allergen Potato, White IgE: <0.1 kU/L
Allergen Rice IgE: <0.1 kU/L
Allergen Salmon IgE: <0.1 kU/L
Allergen Strawberry IgE: <0.1 kU/L
Allergen Sweet Potato IgE: <0.1 kU/L
Allergen Tomato, IgE: <0.1 kU/L
Allergen Turkey IgE: <0.1 kU/L
Allergen Watermelon IgE: <0.1 kU/L
Allergen, Peach f95: <0.1 kU/L
Basil: <0.1 kU/L
Beef IgE: <0.1 kU/L
C074-IgE Gelatin: <0.1 kU/L
Chicken IgE: <0.1 kU/L
Chocolate/Cacao IgE: <0.1 kU/L
Clam IgE: <0.1 kU/L
Codfish IgE: <0.1 kU/L
Coffee: <0.1 kU/L
Cranberry IgE: <0.1 kU/L
Egg White IgE: <0.1 kU/L
F020-IgE Almond: <0.1 kU/L
F023-IgE Crab: <0.1 kU/L
F045-IgE Yeast: <0.1 kU/L
F076-IgE Alpha Lactalbumin: <0.1 kU/L
F077-IgE Beta Lactoglobulin: 0.11 kU/L — AB
F078-IgE Casein: <0.1 kU/L
F080-IgE Lobster: <0.1 kU/L
F081-IgE Cheese, Cheddar Type: <0.1 kU/L
F089-IgE Mustard: <0.1 kU/L
F096-IgE Avocado: <0.1 kU/L
F202-IgE Cashew Nut: <0.1 kU/L
F214-IgE Spinach: <0.1 kU/L
F222-IgE Tea: <0.1 kU/L
F242-IgE Bing Cherry: <0.1 kU/L
F261-IgE Asparagus: <0.1 kU/L
F262-IgE Eggplant: <0.1 kU/L
F265-IgE Cumin: <0.1 kU/L
F278-IgE Bayleaf (Laurel): <0.1 kU/L
F283-IgE Oregano: <0.1 kU/L
F300-IgE Goat's Milk: <0.1 kU/L
F342-IgE Olive, Black: <0.1 kU/L
F343-IgE Raspberry: <0.1 kU/L
IgE Egg (Yolk): <0.1 kU/L
Kidney Bean IgE: <0.1 kU/L
Lemon: <0.1 kU/L
Lima Bean IgE: <0.1 kU/L
Malt: <0.1 kU/L
Mushroom IgE: <0.1 kU/L
Orange: <0.1 kU/L
Paprika IgE: <0.1 kU/L
Peanut IgE: <0.1 kU/L
Pineapple IgE: <0.1 kU/L
Pork IgE: <0.1 kU/L
Pumpkin IgE: <0.1 kU/L
Red Beet: <0.1 kU/L
Rye IgE: <0.1 kU/L
Scallop IgE: <0.1 kU/L
Sesame Seed IgE: <0.1 kU/L
Shrimp IgE: <0.1 kU/L
Soybean IgE: <0.1 kU/L
Tuna: <0.1 kU/L
Vanilla: <0.1 kU/L
Walnut IgE: <0.1 kU/L
Wheat IgE: <0.1 kU/L
Whey: <0.1 kU/L
White Bean IgE: <0.1 kU/L

## 2023-08-20 ENCOUNTER — Telehealth: Payer: Self-pay | Admitting: Nurse Practitioner

## 2023-08-20 ENCOUNTER — Telehealth: Payer: Self-pay

## 2023-08-20 NOTE — Telephone Encounter (Signed)
 Patient called for the status of Allergy referral. I explained to her I am waiting for office notes to be closed, I will call her when I send out-Toni

## 2023-08-20 NOTE — Telephone Encounter (Signed)
-----   Message from [facility] sent at 08/20/2023  8:47 AM EST ----- Low sensitivity to bananas and beta lactoglobulin but this is so low that it would not explain any significant allergic reaction. No further intervention, patient needs to wait to see allergist

## 2023-08-20 NOTE — Progress Notes (Signed)
 Low sensitivity to bananas and beta lactoglobulin but this is so low that it would not explain any significant allergic reaction. No further intervention, patient needs to wait to see allergist

## 2023-08-20 NOTE — Telephone Encounter (Signed)
Pt notified for labs result  

## 2023-08-20 NOTE — Telephone Encounter (Signed)
 Left message for patient to give office a call back.

## 2023-08-21 ENCOUNTER — Other Ambulatory Visit: Payer: Self-pay

## 2023-08-21 ENCOUNTER — Telehealth: Payer: Self-pay

## 2023-08-21 MED ORDER — EPINEPHRINE 0.3 MG/0.3ML IJ SOAJ: 0.3000 mg | INTRAMUSCULAR | 0 refills | Status: AC | PRN

## 2023-08-21 NOTE — Telephone Encounter (Signed)
 Sent Epipen as per alyssa and lmom to pt that we sent

## 2023-08-27 ENCOUNTER — Telehealth: Payer: Self-pay | Admitting: Nurse Practitioner

## 2023-08-27 NOTE — Telephone Encounter (Signed)
 Allergy referral faxed to [facility]; [phone]. Notified patient. Gave pt telephone [phone]

## 2023-10-01 ENCOUNTER — Encounter: Payer: Self-pay | Admitting: Nurse Practitioner

## 2023-10-01 ENCOUNTER — Ambulatory Visit (INDEPENDENT_AMBULATORY_CARE_PROVIDER_SITE_OTHER): Payer: 59 | Admitting: Nurse Practitioner

## 2023-10-01 VITALS — BP 124/84 | HR 72 | Resp 17 | Ht 64.0 in | Wt 158.0 lb

## 2023-10-01 DIAGNOSIS — Z9189 Other specified personal risk factors, not elsewhere classified: Secondary | ICD-10-CM | POA: Diagnosis not present

## 2023-10-01 DIAGNOSIS — B009 Herpesviral infection, unspecified: Secondary | ICD-10-CM

## 2023-10-01 DIAGNOSIS — Z01419 Encounter for gynecological examination (general) (routine) without abnormal findings: Secondary | ICD-10-CM

## 2023-10-01 DIAGNOSIS — N952 Postmenopausal atrophic vaginitis: Secondary | ICD-10-CM | POA: Diagnosis not present

## 2023-10-01 DIAGNOSIS — Z780 Asymptomatic menopausal state: Secondary | ICD-10-CM

## 2023-10-01 MED ORDER — ESTRADIOL 0.1 MG/GM VA CREA: 1.0000 | TOPICAL_CREAM | VAGINAL | 2 refills | Status: AC

## 2023-10-01 NOTE — Progress Notes (Signed)
 Kathy Mccormick 1956-04-24 [id number]   History:  68 y.o. G1P0010 presents for annual exam. Postmenopausal - no HRT. S/P 2000 TVH with subsequent BSO for endometriosis in 2001. 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 HTN, HLD, hypothyroidism, GAD managed by PCP. Vaginal estrogen twice weekly.   Gynecologic History No LMP recorded. Patient has had a hysterectomy.   Contraception/Family planning: status post hysterectomy Sexually active: No  Health Maintenance Last Pap: 09/21/2021. Results were: Normal Last mammogram: 11/09/2022. Results were: Right breast calcifications, stable Last colonoscopy: 2018. Results were: Polyps Last Dexa: 10/25/2021. Results were: Normal  Past medical history, past surgical history, family history and social history were all reviewed and documented in the EPIC chart. Married. 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   ROS:  A ROS was performed and pertinent positives and negatives are included.  Exam:  Vitals:   10/01/23 0818  BP: 124/84  Pulse: 72  Resp: 17  Weight: 158 lb (71.7 kg)  Height: 5\' 4"  (1.626 m)     Body mass index is 27.12 kg/m.  General appearance:  Normal Thyroid:  Symmetrical, normal in size, without palpable masses or nodularity. Respiratory  Auscultation:  Clear without wheezing or rhonchi Cardiovascular  Auscultation:  Regular rate, without rubs, murmurs or gallops  Edema/varicosities:  Not grossly evident Abdominal  Soft,nontender, without masses, guarding or rebound.  Liver/spleen:  No organomegaly noted  Hernia:  None appreciated  Skin  Inspection:  Grossly normal Breasts: Examined lying and sitting.   Right: Without masses, retractions, nipple discharge or axillary adenopathy.   Left: Without masses, retractions, nipple discharge or axillary adenopathy. Pelvic: External genitalia:  no lesions              Urethra:  normal appearing urethra with no masses, tenderness or lesions              Bartholins and Skenes: normal                  Vagina: normal appearing vagina with normal color and discharge, no lesions. Atrophic changes              Cervix: absent Bimanual Exam:  Uterus:  absent              Adnexa: no mass, fullness, tenderness              Rectovaginal: Deferred              Anus:  normal, no lesions  Patient informed chaperone available to be present for breast and pelvic exam. Patient has requested no chaperone to be present. Patient has been advised what will be completed during breast and pelvic exam.   Assessment/Plan:  68 y.o. G1P0010 for annual exam.   Well female exam with routine gynecological exam - Education provided on SBEs, importance of preventative screenings, current guidelines, high calcium diet, regular exercise, and multivitamin daily. Labs with PCP.   Postmenopausal - S/P 2000 TVH with subsequent BSO for endometriosis in 2001.   Atrophic vaginitis - Plan: estradiol (ESTRACE) 0.1 MG/GM vaginal cream twice weekly.   Screening for cervical cancer - CIN-1 year ago prior to hysterectomy. No longer screening per guidelines.   Screening for breast cancer - Stable benign right breast calcifications. UTD. Normal breast exam today.   Screening for colon cancer -  2018 colonoscopy. Will call GI to schedule.   Screening for osteoporosis - Normal bone density 09/2021. Will repeat at 5-year interval per guidelines.   Return in about  1 year (around 09/30/2024) for B&P (high risk).    Kathy Mackie DNP, 8:37 AM 10/01/2023

## 2023-10-07 ENCOUNTER — Ambulatory Visit (INDEPENDENT_AMBULATORY_CARE_PROVIDER_SITE_OTHER): Payer: 59 | Admitting: Nurse Practitioner

## 2023-10-07 ENCOUNTER — Encounter: Payer: Self-pay | Admitting: Nurse Practitioner

## 2023-10-07 VITALS — BP 132/80 | HR 65 | Temp 98.1°F | Resp 16 | Ht 64.0 in | Wt 159.4 lb

## 2023-10-07 DIAGNOSIS — F410 Panic disorder [episodic paroxysmal anxiety] without agoraphobia: Secondary | ICD-10-CM

## 2023-10-07 DIAGNOSIS — E559 Vitamin D deficiency, unspecified: Secondary | ICD-10-CM

## 2023-10-07 DIAGNOSIS — F5101 Primary insomnia: Secondary | ICD-10-CM | POA: Diagnosis not present

## 2023-10-07 DIAGNOSIS — I10 Essential (primary) hypertension: Secondary | ICD-10-CM | POA: Diagnosis not present

## 2023-10-07 DIAGNOSIS — Z780 Asymptomatic menopausal state: Secondary | ICD-10-CM | POA: Diagnosis not present

## 2023-10-07 DIAGNOSIS — E538 Deficiency of other specified B group vitamins: Secondary | ICD-10-CM | POA: Diagnosis not present

## 2023-10-07 DIAGNOSIS — F411 Generalized anxiety disorder: Secondary | ICD-10-CM | POA: Diagnosis not present

## 2023-10-07 DIAGNOSIS — J309 Allergic rhinitis, unspecified: Secondary | ICD-10-CM | POA: Diagnosis not present

## 2023-10-07 DIAGNOSIS — G43009 Migraine without aura, not intractable, without status migrainosus: Secondary | ICD-10-CM | POA: Diagnosis not present

## 2023-10-07 DIAGNOSIS — E782 Mixed hyperlipidemia: Secondary | ICD-10-CM

## 2023-10-07 DIAGNOSIS — E039 Hypothyroidism, unspecified: Secondary | ICD-10-CM

## 2023-10-07 NOTE — Progress Notes (Signed)
 [facility] [address] [place], [place] [address]  Internal MEDICINE  Office Visit Note  Patient Name: Kathy Mccormick  [id number]  [id number]  Date of Service: 10/07/2023  Chief Complaint  Patient presents with   Hyperlipidemia   Hypertension   Gastroesophageal Reflux   Follow-up    HPI Kathy Mccormick presents for a follow-up visit for weight loss, migraines, insomnia, anxiety, high cholesterol, and lab orders. Weight loss -- lost 5 lbs, going to gym  Migraines -- allergic to qulipta , allergy documented in chart. Started aimovig  shots and has not had any migraines since starting that medication.  Insomnia -- takes ambien  sometimes.  Anxiety -- takes alprazolam  as needed. Due for refills. High cholesterol -- takes atorvastatin   Due for routine labs --wants to have hormone levels checked Hyperthyroidism -- takes levothyroxine  daily. Is due for thyroid  labs to be done.    Current Medication: Outpatient Encounter Medications as of 10/07/2023  Medication Sig   acyclovir  (ZOVIRAX ) 400 MG tablet TAKE 1 TABLET BY MOUTH TWICE  DAILY   ALPRAZolam  (XANAX ) 0.5 MG tablet Take 1 tablet (0.5 mg total) by mouth 2 (two) times daily as needed for anxiety. for anxiety   atorvastatin  (LIPITOR) 10 MG tablet Take 1 tablet (10 mg total) by mouth daily.   Azelaic Acid  15 % gel AFTER SKIN IS THOROUGHLY WASHED  AND PATTED DRY, GENTLY BUT  THOROUGHLY MASSAGE A THIN FILM  TOPICALLY INTO AFFECTED AREA  TWICE DAILY (MORNING / EVENING)   busPIRone  (BUSPAR ) 5 MG tablet TAKE 1 TO 2 TABLETS BY MOUTH  TWICE DAILY   calcipotriene  (DOVONOX) 0.005 % cream Apply topically 2 (two) times daily. As directed   CHERRY PO Take by mouth.   ciclopirox  (LOPROX ) 0.77 % cream Apply topically 2 (two) times daily. To feet until resolved.   desonide  (DESOWEN ) 0.05 % cream APPLY 1 APPLICATION TOPICALLY 3  TIMES DAILY AS NEEDED   doxycycline  (VIBRA -TABS) 100 MG tablet TAKE 1 TABLET BY MOUTH DAILY   DULoxetine  (CYMBALTA ) 60 MG  capsule TAKE 1 CAPSULE BY MOUTH 1 TO 2  TIMES DAILY   EPINEPHrine  0.3 mg/0.3 mL IJ SOAJ injection Inject 0.3 mg into the muscle as needed for anaphylaxis.   Erenumab -aooe (AIMOVIG ) 140 MG/ML SOAJ Inject 140 mg into the skin every 30 (thirty) days.   estradiol  (ESTRACE ) 0.1 MG/GM vaginal cream Place 1 Applicatorful vaginally 2 (two) times a week.   fluorouracil  (EFUDEX ) 5 % cream Apply topically 2 (two) times daily. As directed   fluticasone  (FLONASE ) 50 MCG/ACT nasal spray Place 1 spray into both nostrils daily.   levothyroxine  (SYNTHROID ) 50 MCG tablet TAKE 1 TABLET BY MOUTH DAILY  BEFORE BREAKFAST   losartan  (COZAAR ) 100 MG tablet TAKE 1 TABLET BY MOUTH DAILY   meloxicam  (MOBIC ) 15 MG tablet TAKE 1 TABLET BY MOUTH DAILY AS  NEEDED   montelukast  (SINGULAIR ) 10 MG tablet TAKE 1 TABLET BY MOUTH AT  BEDTIME   Multiple Vitamin (MULTIVITAMIN) tablet Take 1 tablet by mouth daily.   omeprazole  (PRILOSEC) 40 MG capsule TAKE 1 CAPSULE BY MOUTH DAILY   triamcinolone  (KENALOG ) 0.025 % ointment Twice daily for up to 1 week for "cool down" to face   triamcinolone  cream (KENALOG ) 0.1 % Apply 1 Application topically 2 (two) times daily.   triamcinolone  ointment (KENALOG ) 0.1 % APPLY TWICE A DAY FOR UP TO 1 WEEK FOR "COOL DOWN" TO BODY   zolmitriptan  (ZOMIG -ZMT) 5 MG disintegrating tablet TAKE 1 TABLET BY MOUTH AS NEEDED FOR MIGRAINES.  MAY REPEAT AFTER 2-3 HRS. MAX DOSE 2 TABS IN 24 HRS   zolpidem  (AMBIEN  CR) 12.5 MG CR tablet Take 1 tablet (12.5 mg total) by mouth at bedtime as needed for sleep. for sleep   [DISCONTINUED] Atogepant  (QULIPTA ) 60 MG TABS Take 1 tablet (60 mg total) by mouth daily.   [DISCONTINUED] Galcanezumab -gnlm (EMGALITY ) 120 MG/ML SOAJ Inject 120 mg into the skin every 30 (thirty) days.   No facility-administered encounter medications on file as of 10/07/2023.    Surgical History: Past Surgical History:  Procedure Laterality Date   BACK SURGERY     BREAST BIOPSY Right 11/15/2021   MM  RT BREAST BX W LOC DEV 1ST LESION IMAGE BX SPEC STEREO GUIDE 11/15/2021 [facility] MAMMOGR[facility]   CARPAL TUNNEL RELEASE     CARPAL TUNNEL RELEASE Bilateral 2005   CHOLECYSTECTOMY     COLPOSCOPY     OOPHORECTOMY     RSO,LSO   PELVIC LAPAROSCOPY  2001,2004   DL X 2 RSO and LSO   TONSILLECTOMY     TRIGGER FINGER RELEASE Right 08/24/2021   VAGINAL HYSTERECTOMY  2000   endometriosis    Medical History: Past Medical History:  Diagnosis Date   Allergy    environmental   Anxiety    CIN I (cervical intraepithelial neoplasia I)    Endometriosis    GERD (gastroesophageal reflux disease)    History of actinic keratoses    Hyperlipidemia    Hypertension    Migraine    Migraines    Neck pain    STD (sexually transmitted disease)    HSV ll    Family History: Family History  Problem Relation Age of Onset   Hypertension Father    Diabetes Mother    Hypertension Mother    Uterine cancer Mother    Liver disease Mother    Hypertension Brother    Heart Problems Brother    Breast cancer Maternal Aunt        Age 83's   Aneurysm Maternal Aunt     Social History   Socioeconomic History   Marital status: Married    Spouse name: Not on file   Number of children: Not on file   Years of education: Not on file   Highest education level: Not on file  Occupational History   Not on file  Tobacco Use   Smoking status: Former   Smokeless tobacco: Never  Vaping Use   Vaping status: Never Used  Substance and Sexual Activity   Alcohol use: No    Alcohol/week: 0.0 standard drinks of alcohol   Drug use: No   Sexual activity: Not Currently    Birth control/protection: Surgical, Post-menopausal    Comment: Hyst; more than 5, before 18, history of STD. no abnormal pap, no DES  Other Topics Concern   Not on file  Social History Narrative   Not on file   Social Drivers of Health   Financial Resource Strain: Not on file  Food Insecurity: Not on file  Transportation Needs: Not on file   Physical Activity: Not on file  Stress: Not on file  Social Connections: Not on file  Intimate Partner Violence: Not on file      Review of Systems  Constitutional:  Negative for chills, fatigue and unexpected weight change.  HENT:  Negative for congestion, rhinorrhea, sneezing and sore throat.   Eyes:  Negative for redness.  Respiratory: Negative.  Negative for cough, chest tightness, shortness of breath and wheezing.  Cardiovascular: Negative.  Negative for chest pain and palpitations.  Gastrointestinal:  Positive for nausea. Negative for abdominal pain, constipation, diarrhea and vomiting.  Genitourinary:  Negative for dysuria and frequency.  Musculoskeletal:  Positive for arthralgias. Negative for joint swelling and neck pain.  Skin:  Negative for rash.  Neurological:  Positive for weakness and headaches. Negative for tremors and numbness.  Hematological:  Negative for adenopathy. Does not bruise/bleed easily.  Psychiatric/Behavioral:  Negative for behavioral problems (Depression), self-injury, sleep disturbance and suicidal ideas. The patient is nervous/anxious.     Vital Signs: BP 132/80   Pulse 65   Temp 98.1 F (36.7 C)   Resp 16   Ht 5\' 4"  (1.626 m)   Wt 159 lb 6.4 oz (72.3 kg)   SpO2 96%   BMI 27.36 kg/m    Physical Exam Vitals reviewed.  Constitutional:      General: She is not in acute distress.    Appearance: Normal appearance. She is normal weight. She is not ill-appearing.  HENT:     Head: Normocephalic and atraumatic.  Eyes:     Pupils: Pupils are equal, round, and reactive to light.  Cardiovascular:     Rate and Rhythm: Normal rate and regular rhythm.  Pulmonary:     Effort: Pulmonary effort is normal. No respiratory distress.  Neurological:     Mental Status: She is alert and oriented to person, place, and time.     Cranial Nerves: No cranial nerve deficit.     Gait: Gait normal.  Psychiatric:        Mood and Affect: Mood normal.         Behavior: Behavior normal.        Assessment/Plan: 1. Essential (primary) hypertension (Primary) Age-appropriate preventive screenings and vaccinations discussed, annual physical exam completed. Routine labs for health maintenance ordered, see below. PHM updated.   - CBC with Differential/Platelet - CMP14+EGFR - Lipid Profile  2. Acquired hypothyroidism Continue levothyroxine  as prescribed. Routine labs ordered  - Lipid Profile - Hgb A1C w/o eAG - TSH + free T4 - Vitamin D  (25 hydroxy) - Estradiol  - FSH/LH  3. Mixed hyperlipidemia Continue atorvastatin  as prescribed. Routine labs ordered  - CBC with Differential/Platelet - CMP14+EGFR - Lipid Profile - Hgb A1C w/o eAG - TSH + free T4 - atorvastatin  (LIPITOR) 10 MG tablet; Take 1 tablet (10 mg total) by mouth daily.  Dispense: 90 tablet; Refill: 3  4. Migraine without aura and without status migrainosus, not intractable Continue aimovig  injections as prescribed.   5. B12 deficiency Routine labs ordered  - CBC with Differential/Platelet - B12 and Folate Panel  6. Vitamin D  deficiency Routine lab ordered  - Vitamin D  (25 hydroxy)  7. Allergic rhinitis, unspecified seasonality, unspecified trigger Continue montelukast  as prescribed  - montelukast  (SINGULAIR ) 10 MG tablet; Take 1 tablet (10 mg total) by mouth at bedtime.  Dispense: 90 tablet; Refill: 3  8. Menopause Routine labs ordered  - TSH + free T4 - Estradiol  - FSH/LH  9. Primary insomnia Continue ambien  as needed   10. Generalized anxiety disorder with panic attacks Continue prn alprazolam  as prescribed. Follow up in 3 months for additional refills.  - ALPRAZolam  (XANAX ) 0.5 MG tablet; Take 1 tablet (0.5 mg total) by mouth 2 (two) times daily as needed for anxiety. for anxiety  Dispense: 60 tablet; Refill: 2    General Counseling: Kathy Mccormick verbalizes understanding of the findings of todays visit and agrees with plan of treatment. I have discussed  any  further diagnostic evaluation that may be needed or ordered today. We also reviewed her medications today. she has been encouraged to call the office with any questions or concerns that should arise related to todays visit.    Orders Placed This Encounter  Procedures   CBC with Differential/Platelet   CMP14+EGFR   Lipid Profile   Hgb A1C w/o eAG   TSH + free T4   Vitamin D  (25 hydroxy)   Estradiol    FSH/LH   B12 and Folate Panel    No orders of the defined types were placed in this encounter.   Return in about 4 weeks (around 11/04/2023) for needs initial new medicare AWV and review labs with Holland Nickson .   Total time spent:30 Minutes Time spent includes review of chart, medications, test results, and follow up plan with the patient.   [place] Controlled Substance Database was reviewed by me.  This patient was seen by Laurence Pons, FNP-C in collaboration with Dr. Verneta Gone as a part of collaborative care agreement.   Brandilyn Nanninga R. Bobbi Burow, MSN, FNP-C Internal medicine

## 2023-10-10 DIAGNOSIS — E538 Deficiency of other specified B group vitamins: Secondary | ICD-10-CM | POA: Diagnosis not present

## 2023-10-10 DIAGNOSIS — E039 Hypothyroidism, unspecified: Secondary | ICD-10-CM | POA: Diagnosis not present

## 2023-10-10 DIAGNOSIS — G43009 Migraine without aura, not intractable, without status migrainosus: Secondary | ICD-10-CM | POA: Diagnosis not present

## 2023-10-10 DIAGNOSIS — F410 Panic disorder [episodic paroxysmal anxiety] without agoraphobia: Secondary | ICD-10-CM | POA: Diagnosis not present

## 2023-10-10 DIAGNOSIS — I10 Essential (primary) hypertension: Secondary | ICD-10-CM | POA: Diagnosis not present

## 2023-10-10 DIAGNOSIS — Z780 Asymptomatic menopausal state: Secondary | ICD-10-CM | POA: Diagnosis not present

## 2023-10-10 DIAGNOSIS — E559 Vitamin D deficiency, unspecified: Secondary | ICD-10-CM | POA: Diagnosis not present

## 2023-10-10 DIAGNOSIS — F5101 Primary insomnia: Secondary | ICD-10-CM | POA: Diagnosis not present

## 2023-10-10 DIAGNOSIS — E782 Mixed hyperlipidemia: Secondary | ICD-10-CM | POA: Diagnosis not present

## 2023-10-10 DIAGNOSIS — F411 Generalized anxiety disorder: Secondary | ICD-10-CM | POA: Diagnosis not present

## 2023-10-11 LAB — LIPID PANEL
Chol/HDL Ratio: 2.9 ratio (ref 0.0–4.4)
Cholesterol, Total: 225 mg/dL — ABNORMAL HIGH (ref 100–199)
HDL: 78 mg/dL (ref >39)
LDL Chol Calc (NIH): 133 mg/dL — ABNORMAL HIGH (ref 0–99)
Triglycerides: 82 mg/dL (ref 0–149)
VLDL Cholesterol Cal: 14 mg/dL (ref 5–40)

## 2023-10-11 LAB — CMP14+EGFR
ALT: 18 IU/L (ref 0–32)
AST: 19 IU/L (ref 0–40)
Albumin: 4.4 g/dL (ref 3.9–4.9)
Alkaline Phosphatase: 93 IU/L (ref 44–121)
BUN/Creatinine Ratio: 24 (ref 12–28)
BUN: 17 mg/dL (ref 8–27)
Bilirubin Total: 0.4 mg/dL (ref 0.0–1.2)
CO2: 24 mmol/L (ref 20–29)
Calcium: 9.4 mg/dL (ref 8.7–10.3)
Chloride: 100 mmol/L (ref 96–106)
Creatinine, Ser: 0.72 mg/dL (ref 0.57–1.00)
Globulin, Total: 1.7 g/dL (ref 1.5–4.5)
Glucose: 101 mg/dL — ABNORMAL HIGH (ref 70–99)
Potassium: 4.2 mmol/L (ref 3.5–5.2)
Sodium: 139 mmol/L (ref 134–144)
Total Protein: 6.1 g/dL (ref 6.0–8.5)
eGFR: 93 mL/min/{1.73_m2} (ref >59)

## 2023-10-11 LAB — CBC WITH DIFFERENTIAL/PLATELET
Basophils Absolute: 0.1 10*3/uL (ref 0.0–0.2)
Basos: 1 %
EOS (ABSOLUTE): 0.1 10*3/uL (ref 0.0–0.4)
Eos: 3 %
Hematocrit: 40.5 % (ref 34.0–46.6)
Hemoglobin: 13.6 g/dL (ref 11.1–15.9)
Immature Grans (Abs): 0 10*3/uL (ref 0.0–0.1)
Immature Granulocytes: 0 %
Lymphocytes Absolute: 1.5 10*3/uL (ref 0.7–3.1)
Lymphs: 31 %
MCH: 31.9 pg (ref 26.6–33.0)
MCHC: 33.6 g/dL (ref 31.5–35.7)
MCV: 95 fL (ref 79–97)
Monocytes Absolute: 0.7 10*3/uL (ref 0.1–0.9)
Monocytes: 14 %
Neutrophils Absolute: 2.4 10*3/uL (ref 1.4–7.0)
Neutrophils: 51 %
Platelets: 359 10*3/uL (ref 150–450)
RBC: 4.26 x10E6/uL (ref 3.77–5.28)
RDW: 12.4 % (ref 11.7–15.4)
WBC: 4.7 10*3/uL (ref 3.4–10.8)

## 2023-10-11 LAB — ESTRADIOL: Estradiol: <5 pg/mL (ref 0.0–54.7)

## 2023-10-11 LAB — B12 AND FOLATE PANEL
Folate: 18.9 ng/mL (ref >3.0)
Vitamin B-12: 926 pg/mL (ref 232–1245)

## 2023-10-11 LAB — FSH/LH
FSH: 59.8 m[IU]/mL (ref 25.8–134.8)
LH: 19 m[IU]/mL (ref 7.7–58.5)

## 2023-10-11 LAB — TSH+FREE T4
Free T4: 1.05 ng/dL (ref 0.82–1.77)
TSH: 1.03 u[IU]/mL (ref 0.450–4.500)

## 2023-10-11 LAB — HGB A1C W/O EAG: Hgb A1c MFr Bld: 5.8 % — ABNORMAL HIGH (ref 4.8–5.6)

## 2023-10-11 LAB — VITAMIN D 25 HYDROXY (VIT D DEFICIENCY, FRACTURES): Vit D, 25-Hydroxy: 80.4 ng/mL (ref 30.0–100.0)

## 2023-10-14 DIAGNOSIS — C44529 Squamous cell carcinoma of skin of other part of trunk: Secondary | ICD-10-CM | POA: Diagnosis not present

## 2023-10-16 ENCOUNTER — Telehealth: Payer: Self-pay | Admitting: Nurse Practitioner

## 2023-10-22 NOTE — Telephone Encounter (Signed)
 ERROR

## 2023-11-07 ENCOUNTER — Other Ambulatory Visit: Payer: Self-pay | Admitting: Nurse Practitioner

## 2023-11-07 DIAGNOSIS — G43E19 Chronic migraine with aura, intractable, without status migrainosus: Secondary | ICD-10-CM

## 2023-11-11 ENCOUNTER — Ambulatory Visit: Payer: Self-pay | Admitting: Nurse Practitioner

## 2023-11-17 ENCOUNTER — Encounter: Payer: Self-pay | Admitting: Nurse Practitioner

## 2023-11-17 MED ORDER — ATORVASTATIN CALCIUM 10 MG PO TABS: 10.0000 mg | ORAL_TABLET | Freq: Every day | ORAL | 3 refills | Status: DC

## 2023-11-17 MED ORDER — MONTELUKAST SODIUM 10 MG PO TABS: 10.0000 mg | ORAL_TABLET | Freq: Every day | ORAL | 3 refills | Status: DC

## 2023-11-17 MED ORDER — ALPRAZOLAM 0.5 MG PO TABS: 0.5000 mg | ORAL_TABLET | Freq: Two times a day (BID) | ORAL | 2 refills | Status: DC | PRN

## 2023-11-18 ENCOUNTER — Ambulatory Visit: Payer: Self-pay | Admitting: Nurse Practitioner

## 2023-11-19 ENCOUNTER — Ambulatory Visit (INDEPENDENT_AMBULATORY_CARE_PROVIDER_SITE_OTHER): Payer: Self-pay | Admitting: Nurse Practitioner

## 2023-11-19 ENCOUNTER — Encounter: Payer: Self-pay | Admitting: Nurse Practitioner

## 2023-11-19 VITALS — BP 130/86 | HR 80 | Temp 97.8°F | Resp 16 | Ht 64.0 in | Wt 159.0 lb

## 2023-11-19 DIAGNOSIS — I10 Essential (primary) hypertension: Secondary | ICD-10-CM | POA: Diagnosis not present

## 2023-11-19 DIAGNOSIS — G43E19 Chronic migraine with aura, intractable, without status migrainosus: Secondary | ICD-10-CM

## 2023-11-19 DIAGNOSIS — Z0000 Encounter for general adult medical examination without abnormal findings: Secondary | ICD-10-CM | POA: Diagnosis not present

## 2023-11-19 DIAGNOSIS — F410 Panic disorder [episodic paroxysmal anxiety] without agoraphobia: Secondary | ICD-10-CM | POA: Diagnosis not present

## 2023-11-19 DIAGNOSIS — E039 Hypothyroidism, unspecified: Secondary | ICD-10-CM

## 2023-11-19 DIAGNOSIS — F5101 Primary insomnia: Secondary | ICD-10-CM | POA: Diagnosis not present

## 2023-11-19 DIAGNOSIS — R928 Other abnormal and inconclusive findings on diagnostic imaging of breast: Secondary | ICD-10-CM | POA: Diagnosis not present

## 2023-11-19 DIAGNOSIS — Z79899 Other long term (current) drug therapy: Secondary | ICD-10-CM

## 2023-11-19 DIAGNOSIS — F411 Generalized anxiety disorder: Secondary | ICD-10-CM

## 2023-11-19 DIAGNOSIS — J309 Allergic rhinitis, unspecified: Secondary | ICD-10-CM

## 2023-11-19 DIAGNOSIS — Z760 Encounter for issue of repeat prescription: Secondary | ICD-10-CM

## 2023-11-19 DIAGNOSIS — E782 Mixed hyperlipidemia: Secondary | ICD-10-CM | POA: Diagnosis not present

## 2023-11-19 DIAGNOSIS — G43009 Migraine without aura, not intractable, without status migrainosus: Secondary | ICD-10-CM

## 2023-11-19 NOTE — Progress Notes (Signed)
 [facility] [address] [place], [place] [address]  Internal MEDICINE  Office Visit Note  Patient Name: Kathy Mccormick  [id number]  [id number]  Date of Service: 11/19/2023  Chief Complaint  Patient presents with   Gastroesophageal Reflux   Hypertension   Hyperlipidemia   Medicare Wellness    HPI Kathy Mccormick presents for an annual well visit and physical exam.  Well-appearing 68 y.o. female with hypertension, hypothyroidism, osteoarthritis, high cholesterol,GAD, and migraines. Routine CRC screening: due in 2030 Routine mammogram: due now  DEXA scan: done in April 2023 Pap smear: discontinued  Labs: lab results reviewed with patient  New or worsening pain: none Other concerns: issues with getting migraine medications approved by her insurance.      11/19/2023    2:35 PM  MMSE - Mini Mental State Exam  Orientation to time 5  Orientation to Place 5  Registration 3  Attention/ Calculation 5  Recall 3  Language- name 2 objects 2  Language- repeat 1  Language- follow 3 step command 3  Language- read & follow direction 1  Write a sentence 1  Copy design 1  Total score 30    Functional Status Survey: Is the patient deaf or have difficulty hearing?: Yes Does the patient have difficulty seeing, even when wearing glasses/contacts?: No Does the patient have difficulty concentrating, remembering, or making decisions?: No Does the patient have difficulty walking or climbing stairs?: No Does the patient have difficulty dressing or bathing?: No Does the patient have difficulty doing errands alone such as visiting a doctor's office or shopping?: No     07/04/2022    4:28 PM 08/21/2022    3:51 PM 09/26/2022    3:21 PM 10/01/2023    8:18 AM 11/19/2023    2:33 PM  Fall Risk  Falls in the past year?  0 1 0 0  Was there an injury with Fall?   1 0 0  Fall Risk Category Calculator   2 0 0  (RETIRED) Patient Fall Risk Level Low fall risk       Patient at Risk for Falls Due to     No Fall Risks No Fall Risks  Fall risk Follow up    Falls evaluation completed Falls evaluation completed     Data saved with a previous flowsheet row definition       11/19/2023    2:34 PM  Depression screen PHQ 2/9  Decreased Interest 0  Down, Depressed, Hopeless 0  PHQ - 2 Score 0       Current Medication: Outpatient Encounter Medications as of 11/19/2023  Medication Sig   calcipotriene  (DOVONOX) 0.005 % cream Apply topically 2 (two) times daily. As directed   CHERRY PO Take by mouth.   ciclopirox  (LOPROX ) 0.77 % cream Apply topically 2 (two) times daily. To feet until resolved.   EPINEPHrine  0.3 mg/0.3 mL IJ SOAJ injection Inject 0.3 mg into the muscle as needed for anaphylaxis.   Erenumab -aooe (AIMOVIG ) 140 MG/ML SOAJ Inject 140 mg into the skin every 30 (thirty) days.   estradiol  (ESTRACE ) 0.1 MG/GM vaginal cream Place 1 Applicatorful vaginally 2 (two) times a week.   fluorouracil  (EFUDEX ) 5 % cream Apply topically 2 (two) times daily. As directed   Multiple Vitamin (MULTIVITAMIN) tablet Take 1 tablet by mouth daily.   triamcinolone  (KENALOG ) 0.025 % ointment Twice daily for up to 1 week for cool down to face   triamcinolone  cream (KENALOG ) 0.1 % Apply 1 Application topically 2 (two)  times daily.   triamcinolone  ointment (KENALOG ) 0.1 % APPLY TWICE A DAY FOR UP TO 1 WEEK FOR COOL DOWN TO BODY   zolpidem  (AMBIEN  CR) 12.5 MG CR tablet Take 1 tablet (12.5 mg total) by mouth at bedtime as needed for sleep. for sleep   [DISCONTINUED] acyclovir  (ZOVIRAX ) 400 MG tablet TAKE 1 TABLET BY MOUTH TWICE  DAILY   [DISCONTINUED] ALPRAZolam  (XANAX ) 0.5 MG tablet Take 1 tablet (0.5 mg total) by mouth 2 (two) times daily as needed for anxiety. for anxiety   [DISCONTINUED] atorvastatin  (LIPITOR) 10 MG tablet Take 1 tablet (10 mg total) by mouth daily.   [DISCONTINUED] Azelaic Acid  15 % gel AFTER SKIN IS THOROUGHLY WASHED  AND PATTED DRY, GENTLY BUT  THOROUGHLY MASSAGE A THIN FILM  TOPICALLY  INTO AFFECTED AREA  TWICE DAILY (MORNING / EVENING)   [DISCONTINUED] busPIRone  (BUSPAR ) 5 MG tablet TAKE 1 TO 2 TABLETS BY MOUTH  TWICE DAILY   [DISCONTINUED] desonide  (DESOWEN ) 0.05 % cream APPLY 1 APPLICATION TOPICALLY 3  TIMES DAILY AS NEEDED   [DISCONTINUED] doxycycline  (VIBRA -TABS) 100 MG tablet TAKE 1 TABLET BY MOUTH DAILY   [DISCONTINUED] DULoxetine  (CYMBALTA ) 60 MG capsule TAKE 1 CAPSULE BY MOUTH 1 TO 2  TIMES DAILY   [DISCONTINUED] fluticasone  (FLONASE ) 50 MCG/ACT nasal spray Place 1 spray into both nostrils daily.   [DISCONTINUED] levothyroxine  (SYNTHROID ) 50 MCG tablet TAKE 1 TABLET BY MOUTH DAILY  BEFORE BREAKFAST   [DISCONTINUED] losartan  (COZAAR ) 100 MG tablet TAKE 1 TABLET BY MOUTH DAILY   [DISCONTINUED] meloxicam  (MOBIC ) 15 MG tablet TAKE 1 TABLET BY MOUTH DAILY AS  NEEDED   [DISCONTINUED] montelukast  (SINGULAIR ) 10 MG tablet Take 1 tablet (10 mg total) by mouth at bedtime.   [DISCONTINUED] omeprazole  (PRILOSEC) 40 MG capsule TAKE 1 CAPSULE BY MOUTH DAILY   [DISCONTINUED] zolmitriptan  (ZOMIG -ZMT) 5 MG disintegrating tablet DISSOLVE 1 TABLET IN MOUTH AS NEEDED FOR MIGRAINES. MAY REPEAT AFTER 2-3 HRS. MAX DOSE 2 TABS/24HRS   Azelaic Acid  15 % gel AFTER SKIN IS THOROUGHLY WASHED  AND PATTED DRY, GENTLY BUT  THOROUGHLY MASSAGE A THIN FILM  TOPICALLY INTO AFFECTED AREA  TWICE DAILY (MORNING / EVENING)   desonide  (DESOWEN ) 0.05 % cream APPLY 1 APPLICATION  TOPICALLY 3 TIMES DAILY AS  NEEDED   [DISCONTINUED] acyclovir  (ZOVIRAX ) 400 MG tablet Take 1 tablet (400 mg total) by mouth 2 (two) times daily.   [DISCONTINUED] ALPRAZolam  (XANAX ) 0.5 MG tablet Take 1 tablet (0.5 mg total) by mouth 2 (two) times daily as needed for anxiety. for anxiety   [DISCONTINUED] atorvastatin  (LIPITOR) 10 MG tablet Take 1 tablet (10 mg total) by mouth daily.   [DISCONTINUED] busPIRone  (BUSPAR ) 5 MG tablet Take 1-2 tablets (5-10 mg total) by mouth 2 (two) times daily.   [DISCONTINUED] doxycycline  (VIBRA -TABS) 100  MG tablet Take 1 tablet (100 mg total) by mouth daily.   [DISCONTINUED] DULoxetine  (CYMBALTA ) 60 MG capsule TAKE 1 CAPSULE BY MOUTH 1 TO 2  TIMES DAILY   [DISCONTINUED] fluticasone  (FLONASE ) 50 MCG/ACT nasal spray Place 1 spray into both nostrils daily.   [DISCONTINUED] levothyroxine  (SYNTHROID ) 50 MCG tablet Take 1 tablet (50 mcg total) by mouth daily before breakfast.   [DISCONTINUED] losartan  (COZAAR ) 100 MG tablet Take 1 tablet (100 mg total) by mouth daily.   [DISCONTINUED] meloxicam  (MOBIC ) 15 MG tablet Take 1 tablet (15 mg total) by mouth daily as needed.   [DISCONTINUED] montelukast  (SINGULAIR ) 10 MG tablet Take 1 tablet (10 mg total) by mouth at bedtime.   [  DISCONTINUED] omeprazole  (PRILOSEC) 40 MG capsule Take 1 capsule (40 mg total) by mouth daily.   [DISCONTINUED] zolmitriptan  (ZOMIG -ZMT) 5 MG disintegrating tablet DISSOLVE 1 TABLET IN MOUTH AS NEEDED FOR MIGRAINES. MAY REPEAT AFTER 2-3 HRS. MAX DOSE 2 TABS/24HRS   No facility-administered encounter medications on file as of 11/19/2023.    Surgical History: Past Surgical History:  Procedure Laterality Date   BACK SURGERY     BREAST BIOPSY Right 11/15/2021   MM RT BREAST BX W LOC DEV 1ST LESION IMAGE BX SPEC STEREO GUIDE 11/15/2021 [facility]   CARPAL TUNNEL RELEASE     CARPAL TUNNEL RELEASE Bilateral 2005   CHOLECYSTECTOMY     COLPOSCOPY     OOPHORECTOMY     RSO,LSO   PELVIC LAPAROSCOPY  2001,2004   DL X 2 RSO and LSO   TONSILLECTOMY     TRIGGER FINGER RELEASE Right 08/24/2021   VAGINAL HYSTERECTOMY  2000   endometriosis    Medical History: Past Medical History:  Diagnosis Date   Allergy    environmental   Anxiety    CIN I (cervical intraepithelial neoplasia I)    Endometriosis    GERD (gastroesophageal reflux disease)    History of actinic keratoses    Hyperlipidemia    Hypertension    Migraine    Migraines    Neck pain    STD (sexually transmitted disease)    HSV ll    Family History: Family  History  Problem Relation Age of Onset   Hypertension Father    Diabetes Mother    Hypertension Mother    Uterine cancer Mother    Liver disease Mother    Hypertension Brother    Heart Problems Brother    Breast cancer Maternal Aunt        Age 38's   Aneurysm Maternal Aunt     Social History   Socioeconomic History   Marital status: Married    Spouse name: Not on file   Number of children: Not on file   Years of education: Not on file   Highest education level: Not on file  Occupational History   Not on file  Tobacco Use   Smoking status: Former   Smokeless tobacco: Never  Vaping Use   Vaping status: Never Used  Substance and Sexual Activity   Alcohol use: No    Alcohol/week: 0.0 standard drinks of alcohol   Drug use: No   Sexual activity: Not Currently    Birth control/protection: Surgical, Post-menopausal    Comment: Hyst; more than 5, before 18, history of STD. no abnormal pap, no DES  Other Topics Concern   Not on file  Social History Narrative   Not on file   Social Drivers of Health   Financial Resource Strain: Not on file  Food Insecurity: Not on file  Transportation Needs: Not on file  Physical Activity: Not on file  Stress: Not on file  Social Connections: Not on file  Intimate Partner Violence: Not on file      Review of Systems  Constitutional:  Positive for fatigue and unexpected weight change. Negative for activity change, appetite change, chills and fever.  HENT: Negative.  Negative for congestion, ear pain, rhinorrhea, sore throat and trouble swallowing.   Eyes: Negative.   Respiratory: Negative.  Negative for cough, chest tightness, shortness of breath and wheezing.   Cardiovascular: Negative.  Negative for chest pain and palpitations.  Gastrointestinal: Negative.  Negative for abdominal pain, blood in stool, constipation,  diarrhea, nausea and vomiting.  Endocrine: Negative.   Genitourinary: Negative.  Negative for difficulty urinating,  dysuria, frequency, hematuria and urgency.  Musculoskeletal: Negative.  Negative for arthralgias, back pain, joint swelling, myalgias and neck pain.  Skin: Negative.  Negative for rash and wound.  Allergic/Immunologic: Negative.  Negative for immunocompromised state.  Neurological:  Positive for headaches. Negative for dizziness, seizures and numbness.  Hematological: Negative.   Psychiatric/Behavioral:  Positive for sleep disturbance. Negative for behavioral problems, self-injury and suicidal ideas. The patient is nervous/anxious.     Vital Signs: BP 130/86   Pulse 80   Temp 97.8 F (36.6 C)   Resp 16   Ht 5' 4 (1.626 m)   Wt 159 lb (72.1 kg)   SpO2 97%   BMI 27.29 kg/m    Physical Exam Vitals reviewed.  Constitutional:      General: She is not in acute distress.    Appearance: Normal appearance. She is well-developed. She is not ill-appearing or diaphoretic.  HENT:     Head: Normocephalic and atraumatic.     Right Ear: Tympanic membrane, ear canal and external ear normal.     Left Ear: Tympanic membrane, ear canal and external ear normal.     Nose: Nose normal. No congestion or rhinorrhea.     Mouth/Throat:     Mouth: Mucous membranes are moist.     Pharynx: No oropharyngeal exudate or posterior oropharyngeal erythema.   Eyes:     General: No scleral icterus.       Right eye: No discharge.        Left eye: No discharge.     Extraocular Movements: Extraocular movements intact.     Conjunctiva/sclera: Conjunctivae normal.     Pupils: Pupils are equal, round, and reactive to light.   Neck:     Thyroid : No thyromegaly.     Vascular: No JVD.     Trachea: No tracheal deviation.   Cardiovascular:     Rate and Rhythm: Normal rate and regular rhythm.     Pulses: Normal pulses.     Heart sounds: Normal heart sounds. No murmur heard.    No friction rub. No gallop.  Pulmonary:     Effort: Pulmonary effort is normal. No respiratory distress.     Breath sounds: Normal  breath sounds. No stridor. No wheezing or rales.  Chest:     Chest wall: No tenderness.  Abdominal:     General: Bowel sounds are normal. There is no distension.     Palpations: Abdomen is soft. There is no mass.     Tenderness: There is no abdominal tenderness. There is no guarding or rebound.   Musculoskeletal:        General: No tenderness or deformity. Normal range of motion.     Cervical back: Normal range of motion and neck supple.  Lymphadenopathy:     Cervical: No cervical adenopathy.   Skin:    General: Skin is warm and dry.     Capillary Refill: Capillary refill takes less than 2 seconds.     Coloration: Skin is not pale.     Findings: No erythema or rash.   Neurological:     Mental Status: She is alert and oriented to person, place, and time.     Cranial Nerves: No cranial nerve deficit.     Motor: No abnormal muscle tone.     Coordination: Coordination normal.     Gait: Gait normal.     Deep Tendon  Reflexes: Reflexes are normal and symmetric.   Psychiatric:        Mood and Affect: Mood normal.        Behavior: Behavior normal.        Thought Content: Thought content normal.        Judgment: Judgment normal.        Assessment/Plan: 1. Encounter for subsequent annual wellness visit (AWV) in Medicare patient (Primary) Age-appropriate preventive screenings and vaccinations discussed. Routine labs for health maintenance results reviewed with patient. PHM updated.    2. Intractable chronic migraine with aura and without status migrainosus Waiting for prior auth approval for aimovig   3. Essential (primary) hypertension Stable, continue medications as prescribed.   4. Acquired hypothyroidism Continue levothyroxine  as prescribed.   5. Mixed hyperlipidemia Continue atorvastatin  as prescribed.   6. Abnormal mammogram of right breast Diagnostic mammogram ortdered  - MM 3D DIAGNOSTIC MAMMOGRAM BILATERAL BREAST; Future  7. Encounter for medication  review Medication list reviewed, updated and refills ordered  - Azelaic Acid  15 % gel; AFTER SKIN IS THOROUGHLY WASHED  AND PATTED DRY, GENTLY BUT  THOROUGHLY MASSAGE A THIN FILM  TOPICALLY INTO AFFECTED AREA  TWICE DAILY (MORNING / EVENING)  Dispense: 150 g; Refill: 1 - desonide  (DESOWEN ) 0.05 % cream; APPLY 1 APPLICATION  TOPICALLY 3 TIMES DAILY AS  NEEDED  Dispense: 60 g; Refill: 2  8. Generalized anxiety disorder with panic attacks Continue duloxetine  as prescribed. Continue prn alprazolam  as prescribed.   9. Primary insomnia Continue ambien  as prescribed      General Counseling: Hagan verbalizes understanding of the findings of todays visit and agrees with plan of treatment. I have discussed any further diagnostic evaluation that may be needed or ordered today. We also reviewed her medications today. she has been encouraged to call the office with any questions or concerns that should arise related to todays visit.    Orders Placed This Encounter  Procedures   MM 3D DIAGNOSTIC MAMMOGRAM BILATERAL BREAST    Meds ordered this encounter  Medications   DISCONTD: acyclovir  (ZOVIRAX ) 400 MG tablet    Sig: Take 1 tablet (400 mg total) by mouth 2 (two) times daily.    Dispense:  180 tablet    Refill:  1    Please send a replace/new response with 90-Day Supply if appropriate to maximize member benefit. Requesting 1 year supply.   DISCONTD: ALPRAZolam  (XANAX ) 0.5 MG tablet    Sig: Take 1 tablet (0.5 mg total) by mouth 2 (two) times daily as needed for anxiety. for anxiety    Dispense:  60 tablet    Refill:  2   DISCONTD: atorvastatin  (LIPITOR) 10 MG tablet    Sig: Take 1 tablet (10 mg total) by mouth daily.    Dispense:  90 tablet    Refill:  3   Azelaic Acid  15 % gel    Sig: AFTER SKIN IS THOROUGHLY WASHED  AND PATTED DRY, GENTLY BUT  THOROUGHLY MASSAGE A THIN FILM  TOPICALLY INTO AFFECTED AREA  TWICE DAILY (MORNING / EVENING)    Dispense:  150 g    Refill:  1    Please send a  replace/new response with 90-Day Supply if appropriate to maximize member benefit. Requesting 1 year supply.   DISCONTD: busPIRone  (BUSPAR ) 5 MG tablet    Sig: Take 1-2 tablets (5-10 mg total) by mouth 2 (two) times daily.    Dispense:  120 tablet    Refill:  0   desonide  (DESOWEN )  0.05 % cream    Sig: APPLY 1 APPLICATION  TOPICALLY 3 TIMES DAILY AS  NEEDED    Dispense:  60 g    Refill:  2   DISCONTD: doxycycline  (VIBRA -TABS) 100 MG tablet    Sig: Take 1 tablet (100 mg total) by mouth daily.    Dispense:  90 tablet    Refill:  1    Please send a replace/new response with 90-Day Supply if appropriate to maximize member benefit. Requesting 1 year supply.   DISCONTD: DULoxetine  (CYMBALTA ) 60 MG capsule    Sig: TAKE 1 CAPSULE BY MOUTH 1 TO 2  TIMES DAILY    Dispense:  180 capsule    Refill:  3    Please send a replace/new response with 90-Day Supply if appropriate to maximize member benefit. Requesting 1 year supply.   DISCONTD: fluticasone  (FLONASE ) 50 MCG/ACT nasal spray    Sig: Place 1 spray into both nostrils daily.    Dispense:  48 g    Refill:  1   DISCONTD: levothyroxine  (SYNTHROID ) 50 MCG tablet    Sig: Take 1 tablet (50 mcg total) by mouth daily before breakfast.    Dispense:  90 tablet    Refill:  3    Please send a replace/new response with 90-Day Supply if appropriate to maximize member benefit. Requesting 1 year supply.   DISCONTD: losartan  (COZAAR ) 100 MG tablet    Sig: Take 1 tablet (100 mg total) by mouth daily.    Dispense:  90 tablet    Refill:  3    Please send a replace/new response with 90-Day Supply if appropriate to maximize member benefit. Requesting 1 year supply.   DISCONTD: meloxicam  (MOBIC ) 15 MG tablet    Sig: Take 1 tablet (15 mg total) by mouth daily as needed.    Dispense:  90 tablet    Refill:  3    Please send a replace/new response with 90-Day Supply if appropriate to maximize member benefit. Requesting 1 year supply.   DISCONTD: montelukast   (SINGULAIR ) 10 MG tablet    Sig: Take 1 tablet (10 mg total) by mouth at bedtime.    Dispense:  90 tablet    Refill:  3    Please send a replace/new response with 90-Day Supply if appropriate to maximize member benefit. Requesting 1 year supply.   DISCONTD: omeprazole  (PRILOSEC) 40 MG capsule    Sig: Take 1 capsule (40 mg total) by mouth daily.    Dispense:  90 capsule    Refill:  3    Please send a replace/new response with 90-Day Supply if appropriate to maximize member benefit. Requesting 1 year supply.   DISCONTD: zolmitriptan  (ZOMIG -ZMT) 5 MG disintegrating tablet    Sig: DISSOLVE 1 TABLET IN MOUTH AS NEEDED FOR MIGRAINES. MAY REPEAT AFTER 2-3 HRS. MAX DOSE 2 TABS/24HRS    Dispense:  12 tablet    Refill:  3    Return in about 3 months (around 02/19/2024) for F/U, anxiety med refill, Jerusalem Brownstein PCP.   Total time spent:30 Minutes Time spent includes review of chart, medications, test results, and follow up plan with the patient.   [place] Controlled Substance Database was reviewed by me.  This patient was seen by Mardy Maxin, FNP-C in collaboration with Dr. Sigrid Bathe as a part of collaborative care agreement.  Nathalya Wolanski R. Maxin, MSN, FNP-C Internal medicine

## 2023-11-26 ENCOUNTER — Other Ambulatory Visit: Payer: Self-pay

## 2023-11-26 ENCOUNTER — Telehealth: Payer: Self-pay

## 2023-11-26 DIAGNOSIS — G43E19 Chronic migraine with aura, intractable, without status migrainosus: Secondary | ICD-10-CM

## 2023-11-26 MED ORDER — DESONIDE 0.05 % EX CREA: TOPICAL_CREAM | CUTANEOUS | 2 refills | Status: AC

## 2023-11-26 MED ORDER — ZOLMITRIPTAN 5 MG PO TBDP: ORAL_TABLET | ORAL | 3 refills | Status: DC

## 2023-11-26 MED ORDER — ATORVASTATIN CALCIUM 10 MG PO TABS
10.0000 mg | ORAL_TABLET | Freq: Every day | ORAL | 3 refills | Status: DC
Start: 2023-11-26 — End: 2023-12-04

## 2023-11-26 MED ORDER — MONTELUKAST SODIUM 10 MG PO TABS
10.0000 mg | ORAL_TABLET | Freq: Every day | ORAL | 3 refills | Status: DC
Start: 2023-11-26 — End: 2023-12-04

## 2023-11-26 MED ORDER — DULOXETINE HCL 60 MG PO CPEP: ORAL_CAPSULE | ORAL | 3 refills | Status: DC

## 2023-11-26 MED ORDER — MELOXICAM 15 MG PO TABS: 15.0000 mg | ORAL_TABLET | Freq: Every day | ORAL | 3 refills | Status: DC | PRN

## 2023-11-26 MED ORDER — OMEPRAZOLE 40 MG PO CPDR: 40.0000 mg | DELAYED_RELEASE_CAPSULE | Freq: Every day | ORAL | 3 refills | Status: DC

## 2023-11-26 MED ORDER — ALPRAZOLAM 0.5 MG PO TABS: 0.5000 mg | ORAL_TABLET | Freq: Two times a day (BID) | ORAL | 2 refills | Status: DC | PRN

## 2023-11-26 MED ORDER — ACYCLOVIR 400 MG PO TABS
400.0000 mg | ORAL_TABLET | Freq: Two times a day (BID) | ORAL | 1 refills | Status: DC
Start: 2023-11-26 — End: 2023-12-04

## 2023-11-26 MED ORDER — DOXYCYCLINE HYCLATE 100 MG PO TABS: 100.0000 mg | ORAL_TABLET | Freq: Every day | ORAL | 1 refills | Status: DC

## 2023-11-26 MED ORDER — LOSARTAN POTASSIUM 100 MG PO TABS: 100.0000 mg | ORAL_TABLET | Freq: Every day | ORAL | 3 refills | Status: DC

## 2023-11-26 MED ORDER — LEVOTHYROXINE SODIUM 50 MCG PO TABS
50.0000 ug | ORAL_TABLET | Freq: Every day | ORAL | 3 refills | Status: DC
Start: 2023-11-26 — End: 2023-12-04

## 2023-11-26 MED ORDER — FLUTICASONE PROPIONATE 50 MCG/ACT NA SUSP: 1.0000 | Freq: Every day | NASAL | 1 refills | Status: DC

## 2023-11-26 MED ORDER — BUSPIRONE HCL 5 MG PO TABS
5.0000 mg | ORAL_TABLET | Freq: Two times a day (BID) | ORAL | 0 refills | Status: DC
Start: 2023-11-26 — End: 2023-12-04

## 2023-11-26 MED ORDER — AZELAIC ACID 15 % EX GEL: CUTANEOUS | 1 refills | Status: AC

## 2023-11-26 NOTE — Telephone Encounter (Signed)
 Patient was notified that the Aimovig  was approved.

## 2023-12-02 ENCOUNTER — Telehealth: Payer: Self-pay

## 2023-12-02 DIAGNOSIS — F410 Panic disorder [episodic paroxysmal anxiety] without agoraphobia: Secondary | ICD-10-CM

## 2023-12-02 DIAGNOSIS — Z79899 Other long term (current) drug therapy: Secondary | ICD-10-CM

## 2023-12-02 DIAGNOSIS — J309 Allergic rhinitis, unspecified: Secondary | ICD-10-CM

## 2023-12-02 DIAGNOSIS — E782 Mixed hyperlipidemia: Secondary | ICD-10-CM

## 2023-12-02 DIAGNOSIS — Z760 Encounter for issue of repeat prescription: Secondary | ICD-10-CM

## 2023-12-02 NOTE — Telephone Encounter (Signed)
 done

## 2023-12-04 MED ORDER — LOSARTAN POTASSIUM 100 MG PO TABS: 100.0000 mg | ORAL_TABLET | Freq: Every day | ORAL | 3 refills | Status: AC

## 2023-12-04 MED ORDER — DULOXETINE HCL 60 MG PO CPEP: ORAL_CAPSULE | ORAL | 3 refills | Status: AC

## 2023-12-04 MED ORDER — BUSPIRONE HCL 5 MG PO TABS: 5.0000 mg | ORAL_TABLET | Freq: Two times a day (BID) | ORAL | 0 refills | Status: DC

## 2023-12-04 MED ORDER — DOXYCYCLINE HYCLATE 100 MG PO TABS: 100.0000 mg | ORAL_TABLET | Freq: Every day | ORAL | 1 refills | Status: DC

## 2023-12-04 MED ORDER — FLUTICASONE PROPIONATE 50 MCG/ACT NA SUSP: 1.0000 | Freq: Every day | NASAL | 1 refills | Status: AC

## 2023-12-04 MED ORDER — ATORVASTATIN CALCIUM 10 MG PO TABS
10.0000 mg | ORAL_TABLET | Freq: Every day | ORAL | 3 refills | Status: AC
Start: 2023-12-04 — End: ?

## 2023-12-04 MED ORDER — ALPRAZOLAM 0.5 MG PO TABS: 0.5000 mg | ORAL_TABLET | Freq: Two times a day (BID) | ORAL | 2 refills | Status: DC | PRN

## 2023-12-04 MED ORDER — ACYCLOVIR 400 MG PO TABS: 400.0000 mg | ORAL_TABLET | Freq: Two times a day (BID) | ORAL | 1 refills | Status: DC

## 2023-12-04 MED ORDER — MELOXICAM 15 MG PO TABS: 15.0000 mg | ORAL_TABLET | Freq: Every day | ORAL | 3 refills | Status: AC | PRN

## 2023-12-04 MED ORDER — OMEPRAZOLE 40 MG PO CPDR: 40.0000 mg | DELAYED_RELEASE_CAPSULE | Freq: Every day | ORAL | 3 refills | Status: AC

## 2023-12-04 MED ORDER — MONTELUKAST SODIUM 10 MG PO TABS: 10.0000 mg | ORAL_TABLET | Freq: Every day | ORAL | 3 refills | Status: AC

## 2023-12-04 MED ORDER — LEVOTHYROXINE SODIUM 50 MCG PO TABS: 50.0000 ug | ORAL_TABLET | Freq: Every day | ORAL | 3 refills | Status: AC

## 2023-12-04 NOTE — Telephone Encounter (Signed)
 Switched pharmacy [facility] to [facility] [address] [facility]

## 2023-12-06 ENCOUNTER — Ambulatory Visit
Admission: RE | Admit: 2023-12-06 | Discharge: 2023-12-06 | Disposition: A | Discharge status: Home / Self Care | Source: Ambulatory Visit | Attending: Nurse Practitioner | Admitting: Nurse Practitioner

## 2023-12-06 ENCOUNTER — Other Ambulatory Visit

## 2023-12-06 DIAGNOSIS — R921 Mammographic calcification found on diagnostic imaging of breast: Secondary | ICD-10-CM | POA: Diagnosis not present

## 2023-12-06 DIAGNOSIS — R928 Other abnormal and inconclusive findings on diagnostic imaging of breast: Secondary | ICD-10-CM

## 2023-12-27 ENCOUNTER — Other Ambulatory Visit: Payer: Self-pay | Admitting: Nurse Practitioner

## 2023-12-27 DIAGNOSIS — F410 Panic disorder [episodic paroxysmal anxiety] without agoraphobia: Secondary | ICD-10-CM

## 2024-01-27 DIAGNOSIS — L821 Other seborrheic keratosis: Secondary | ICD-10-CM | POA: Diagnosis not present

## 2024-01-27 DIAGNOSIS — D045 Carcinoma in situ of skin of trunk: Secondary | ICD-10-CM | POA: Diagnosis not present

## 2024-01-27 DIAGNOSIS — D0461 Carcinoma in situ of skin of right upper limb, including shoulder: Secondary | ICD-10-CM | POA: Diagnosis not present

## 2024-01-27 DIAGNOSIS — L814 Other melanin hyperpigmentation: Secondary | ICD-10-CM | POA: Diagnosis not present

## 2024-01-27 DIAGNOSIS — D485 Neoplasm of uncertain behavior of skin: Secondary | ICD-10-CM | POA: Diagnosis not present

## 2024-01-27 DIAGNOSIS — L570 Actinic keratosis: Secondary | ICD-10-CM | POA: Diagnosis not present

## 2024-01-27 DIAGNOSIS — D229 Melanocytic nevi, unspecified: Secondary | ICD-10-CM | POA: Diagnosis not present

## 2024-02-03 DIAGNOSIS — S46212A Strain of muscle, fascia and tendon of other parts of biceps, left arm, initial encounter: Secondary | ICD-10-CM | POA: Diagnosis not present

## 2024-02-12 DIAGNOSIS — H16223 Keratoconjunctivitis sicca, not specified as Sjogren's, bilateral: Secondary | ICD-10-CM | POA: Diagnosis not present

## 2024-02-12 DIAGNOSIS — H02105 Unspecified ectropion of left lower eyelid: Secondary | ICD-10-CM | POA: Diagnosis not present

## 2024-02-12 DIAGNOSIS — H10502 Unspecified blepharoconjunctivitis, left eye: Secondary | ICD-10-CM | POA: Diagnosis not present

## 2024-02-17 ENCOUNTER — Telehealth: Payer: Self-pay

## 2024-02-17 NOTE — Progress Notes (Signed)
   02/17/2024  Patient ID: Kathy Mccormick, female   DOB: 09/11/1955, 68 y.o.   MRN: [id number]  This patient is appearing on a report for being at risk of failing the adherence measure for identified medications this calendar year.   Medication Adherence Summary (STAR/HEDIS Monitoring): Adherence Category: cholesterol (statin) and hypertension (ACEi/ARB)    Drug Name: Losartan  100 mg  Sold Date:12/05/2023 Days' Supply: 90   Drug Name: Atorvastatin  10 mg Sold Date:12/05/2023 Days' Supply: 90     Notes: ? Adherence data pulled from pharmacy claims portal Dr. Annemarie. ? Reviewed barriers to adherence: N/A. ? Plan: Will follow up prior to next refill.   Dorcas Solian, PharmD Clinical Pharmacist Cell: [phone]

## 2024-02-18 ENCOUNTER — Ambulatory Visit: Admitting: Nurse Practitioner

## 2024-02-19 DIAGNOSIS — H02105 Unspecified ectropion of left lower eyelid: Secondary | ICD-10-CM | POA: Diagnosis not present

## 2024-02-19 DIAGNOSIS — H10502 Unspecified blepharoconjunctivitis, left eye: Secondary | ICD-10-CM | POA: Diagnosis not present

## 2024-02-25 DIAGNOSIS — H43813 Vitreous degeneration, bilateral: Secondary | ICD-10-CM | POA: Diagnosis not present

## 2024-02-25 DIAGNOSIS — H04123 Dry eye syndrome of bilateral lacrimal glands: Secondary | ICD-10-CM | POA: Diagnosis not present

## 2024-02-25 DIAGNOSIS — H2513 Age-related nuclear cataract, bilateral: Secondary | ICD-10-CM | POA: Diagnosis not present

## 2024-02-25 DIAGNOSIS — H04332 Acute lacrimal canaliculitis of left lacrimal passage: Secondary | ICD-10-CM | POA: Diagnosis not present

## 2024-02-27 DIAGNOSIS — S46112A Strain of muscle, fascia and tendon of long head of biceps, left arm, initial encounter: Secondary | ICD-10-CM | POA: Diagnosis not present

## 2024-03-09 ENCOUNTER — Telehealth: Payer: Self-pay

## 2024-03-09 DIAGNOSIS — D0461 Carcinoma in situ of skin of right upper limb, including shoulder: Secondary | ICD-10-CM | POA: Diagnosis not present

## 2024-03-09 DIAGNOSIS — D2261 Melanocytic nevi of right upper limb, including shoulder: Secondary | ICD-10-CM | POA: Diagnosis not present

## 2024-03-09 DIAGNOSIS — L570 Actinic keratosis: Secondary | ICD-10-CM | POA: Diagnosis not present

## 2024-03-09 NOTE — Progress Notes (Signed)
   03/09/2024  Patient ID: Kathy Mccormick, female   DOB: [birth year], 68 y.o.   MRN: [id number]  This patient is appearing on a report for being at risk of failing the adherence measure for identified medications this calendar year.   Medication Adherence Summary (STAR/HEDIS Monitoring): Adherence Category: cholesterol (statin) and hypertension (ACEi/ARB)    Drug Name: Losartan  100 mg  Sold Date:03/04/2024 Days' Supply: 90   Drug Name: Atorvastatin  10 mg Sold Date:03/04/2024 Days' Supply: 90     Notes: ? Adherence data pulled from pharmacy claims portal Dr. Annemarie. ? Reviewed barriers to adherence: N/A. ? Plan: Will follow up prior to next refill.   Dorcas Solian, PharmD Clinical Pharmacist Cell: [phone]

## 2024-03-12 DIAGNOSIS — H04332 Acute lacrimal canaliculitis of left lacrimal passage: Secondary | ICD-10-CM | POA: Diagnosis not present

## 2024-03-12 DIAGNOSIS — H2513 Age-related nuclear cataract, bilateral: Secondary | ICD-10-CM | POA: Diagnosis not present

## 2024-03-26 ENCOUNTER — Other Ambulatory Visit: Payer: Self-pay | Admitting: Nurse Practitioner

## 2024-03-26 DIAGNOSIS — G43E19 Chronic migraine with aura, intractable, without status migrainosus: Secondary | ICD-10-CM

## 2024-03-28 ENCOUNTER — Other Ambulatory Visit: Payer: Self-pay | Admitting: Nurse Practitioner

## 2024-03-28 DIAGNOSIS — G43E19 Chronic migraine with aura, intractable, without status migrainosus: Secondary | ICD-10-CM

## 2024-03-30 DIAGNOSIS — D044 Carcinoma in situ of skin of scalp and neck: Secondary | ICD-10-CM | POA: Diagnosis not present

## 2024-03-30 DIAGNOSIS — Z85828 Personal history of other malignant neoplasm of skin: Secondary | ICD-10-CM | POA: Diagnosis not present

## 2024-04-02 DIAGNOSIS — H04332 Acute lacrimal canaliculitis of left lacrimal passage: Secondary | ICD-10-CM | POA: Diagnosis not present

## 2024-04-13 DIAGNOSIS — H2513 Age-related nuclear cataract, bilateral: Secondary | ICD-10-CM | POA: Diagnosis not present

## 2024-04-13 DIAGNOSIS — H04332 Acute lacrimal canaliculitis of left lacrimal passage: Secondary | ICD-10-CM | POA: Diagnosis not present

## 2024-04-28 DIAGNOSIS — H2511 Age-related nuclear cataract, right eye: Secondary | ICD-10-CM | POA: Diagnosis not present

## 2024-04-28 DIAGNOSIS — H2513 Age-related nuclear cataract, bilateral: Secondary | ICD-10-CM | POA: Diagnosis not present

## 2024-04-29 ENCOUNTER — Encounter: Payer: Self-pay | Admitting: Ophthalmology

## 2024-04-29 ENCOUNTER — Other Ambulatory Visit: Payer: Self-pay

## 2024-05-04 NOTE — Discharge Instructions (Signed)

## 2024-05-06 ENCOUNTER — Ambulatory Visit: Payer: Self-pay | Admitting: Anesthesiology

## 2024-05-06 ENCOUNTER — Encounter: Payer: Self-pay | Admitting: Ophthalmology

## 2024-05-06 ENCOUNTER — Encounter
Admission: RE | Disposition: A | Discharge status: Home / Self Care | Payer: Self-pay | Source: Home / Self Care | Attending: Ophthalmology

## 2024-05-06 ENCOUNTER — Ambulatory Visit
Admission: RE | Admit: 2024-05-06 | Discharge: 2024-05-06 | Disposition: A | Discharge status: Home / Self Care | Attending: Ophthalmology | Admitting: Ophthalmology

## 2024-05-06 ENCOUNTER — Other Ambulatory Visit: Payer: Self-pay

## 2024-05-06 DIAGNOSIS — Z87891 Personal history of nicotine dependence: Secondary | ICD-10-CM | POA: Diagnosis not present

## 2024-05-06 DIAGNOSIS — G43909 Migraine, unspecified, not intractable, without status migrainosus: Secondary | ICD-10-CM | POA: Diagnosis not present

## 2024-05-06 DIAGNOSIS — E039 Hypothyroidism, unspecified: Secondary | ICD-10-CM | POA: Diagnosis not present

## 2024-05-06 DIAGNOSIS — F419 Anxiety disorder, unspecified: Secondary | ICD-10-CM | POA: Insufficient documentation

## 2024-05-06 DIAGNOSIS — I10 Essential (primary) hypertension: Secondary | ICD-10-CM | POA: Insufficient documentation

## 2024-05-06 DIAGNOSIS — K219 Gastro-esophageal reflux disease without esophagitis: Secondary | ICD-10-CM | POA: Diagnosis not present

## 2024-05-06 DIAGNOSIS — E782 Mixed hyperlipidemia: Secondary | ICD-10-CM | POA: Diagnosis not present

## 2024-05-06 DIAGNOSIS — H2511 Age-related nuclear cataract, right eye: Secondary | ICD-10-CM | POA: Insufficient documentation

## 2024-05-06 HISTORY — PX: CATARACT EXTRACTION W/PHACO: SHX586

## 2024-05-06 SURGERY — PHACOEMULSIFICATION, CATARACT, WITH IOL INSERTION
Anesthesia: Monitor Anesthesia Care | Site: Eye | Laterality: Right

## 2024-05-06 MED ORDER — TETRACAINE HCL 0.5 % OP SOLN
OPHTHALMIC | Status: AC
  Filled 2024-05-06: qty 4

## 2024-05-06 MED ORDER — PHENYLEPHRINE HCL 10 % OP SOLN
1.0000 [drp] | OPHTHALMIC | Status: AC
  Administered 2024-05-06 (×3): 1 [drp] via OPHTHALMIC

## 2024-05-06 MED ORDER — SIGHTPATH DOSE#1 NA HYALUR & NA CHOND-NA HYALUR IO KIT
PACK | INTRAOCULAR | Status: DC | PRN
Start: 2024-05-06 — End: 2024-05-06
  Administered 2024-05-06: 1 via OPHTHALMIC

## 2024-05-06 MED ORDER — MIDAZOLAM HCL (PF) 2 MG/2ML IJ SOLN
INTRAMUSCULAR | Status: DC | PRN
  Administered 2024-05-06: 2 mg via INTRAVENOUS

## 2024-05-06 MED ORDER — EPINEPHRINE PF 1 MG/ML IJ SOLN
INTRAMUSCULAR | Status: DC | PRN
  Administered 2024-05-06: 69 mL via OPHTHALMIC

## 2024-05-06 MED ORDER — MIDAZOLAM HCL 2 MG/2ML IJ SOLN
INTRAMUSCULAR | Status: AC
Start: 2024-05-06 — End: 2024-05-06
  Filled 2024-05-06: qty 2

## 2024-05-06 MED ORDER — FENTANYL CITRATE (PF) 100 MCG/2ML IJ SOLN
INTRAMUSCULAR | Status: AC
  Filled 2024-05-06: qty 2

## 2024-05-06 MED ORDER — SIGHTPATH DOSE#1 BSS IO SOLN
INTRAOCULAR | Status: DC | PRN
  Administered 2024-05-06: 15 mL via INTRAOCULAR

## 2024-05-06 MED ORDER — FENTANYL CITRATE (PF) 100 MCG/2ML IJ SOLN
INTRAMUSCULAR | Status: DC | PRN
  Administered 2024-05-06 (×2): 50 ug via INTRAVENOUS

## 2024-05-06 MED ORDER — TETRACAINE HCL 0.5 % OP SOLN
1.0000 [drp] | OPHTHALMIC | Status: DC | PRN
  Administered 2024-05-06 (×3): 1 [drp] via OPHTHALMIC

## 2024-05-06 MED ORDER — CYCLOPENTOLATE HCL 2 % OP SOLN
1.0000 [drp] | OPHTHALMIC | Status: AC
  Administered 2024-05-06 (×3): 1 [drp] via OPHTHALMIC

## 2024-05-06 MED ORDER — PHENYLEPHRINE HCL 10 % OP SOLN
OPHTHALMIC | Status: AC
  Filled 2024-05-06: qty 5

## 2024-05-06 MED ORDER — CEFUROXIME OPHTHALMIC INJECTION 1 MG/0.1 ML
INJECTION | OPHTHALMIC | Status: DC | PRN
Start: 2024-05-06 — End: 2024-05-06
  Administered 2024-05-06: 1 mg via INTRACAMERAL

## 2024-05-06 MED ORDER — CYCLOPENTOLATE HCL 2 % OP SOLN
OPHTHALMIC | Status: AC
Start: 2024-05-06 — End: 2024-05-06
  Filled 2024-05-06: qty 2

## 2024-05-06 MED ORDER — LIDOCAINE HCL (PF) 2 % IJ SOLN
INTRAOCULAR | Status: DC | PRN
  Administered 2024-05-06: 2 mL

## 2024-05-06 MED ORDER — BRIMONIDINE TARTRATE-TIMOLOL 0.2-0.5 % OP SOLN
OPHTHALMIC | Status: DC | PRN
  Administered 2024-05-06: 1 [drp] via OPHTHALMIC

## 2024-05-06 SURGICAL SUPPLY — 8 items
FEE CATARACT SUITE SIGHTPATH (MISCELLANEOUS) ×1 IMPLANT
GLOVE BIOGEL PI IND STRL 8 (GLOVE) ×1 IMPLANT
GLOVE SURG LX STRL 7.5 STRW (GLOVE) ×1 IMPLANT
GLOVE SURG SYN 6.5 PF PI BL (GLOVE) ×1 IMPLANT
LENS IOL TECNIS EYHANCE 24.5 (Intraocular Lens) IMPLANT
NDL FILTER BLUNT 18X1 1/2 (NEEDLE) ×1 IMPLANT
NEEDLE FILTER BLUNT 18X1 1/2 (NEEDLE) ×1 IMPLANT
SYR 3ML LL SCALE MARK (SYRINGE) ×1 IMPLANT

## 2024-05-06 NOTE — Anesthesia Preprocedure Evaluation (Addendum)
 Anesthesia Evaluation  Patient identified by MRN, date of birth, ID band Patient awake    Reviewed: Allergy & Precautions, H&P , NPO status , Patient's Chart, lab work & pertinent test results, reviewed documented beta blocker date and time   History of Anesthesia Complications Negative for: history of anesthetic complications  Airway Mallampati: II  TM Distance: >3 FB Neck ROM: full    Dental  (+) Caps, Missing, Dental Advidsory Given   Pulmonary former smoker   Pulmonary exam normal breath sounds clear to auscultation       Cardiovascular Exercise Tolerance: Good hypertension, (-) angina (-) Past MI and (-) Cardiac Stents Normal cardiovascular exam(-) dysrhythmias (-) Valvular Problems/Murmurs Rhythm:regular Rate:Normal     Neuro/Psych  Headaches PSYCHIATRIC DISORDERS Anxiety        GI/Hepatic Neg liver ROS,GERD  Controlled,,  Endo/Other  Hypothyroidism    Renal/GU negative Renal ROS  negative genitourinary   Musculoskeletal   Abdominal   Peds  Hematology negative hematology ROS (+)   Anesthesia Other Findings Past Medical History: No date: Allergy     Comment:  environmental No date: Anxiety No date: CIN I (cervical intraepithelial neoplasia I) No date: Endometriosis No date: GERD (gastroesophageal reflux disease) No date: History of actinic keratoses No date: Hyperlipidemia No date: Hypertension No date: Migraine No date: Migraines No date: Neck pain No date: STD (sexually transmitted disease)     Comment:  HSV ll   Reproductive/Obstetrics negative OB ROS                              Anesthesia Physical Anesthesia Plan  ASA: 2  Anesthesia Plan: MAC   Post-op Pain Management:    Induction: Intravenous  PONV Risk Score and Plan:   Airway Management Planned: Natural Airway and Nasal Cannula  Additional Equipment:   Intra-op Plan:   Post-operative Plan:    Informed Consent: I have reviewed the patients History and Physical, chart, labs and discussed the procedure including the risks, benefits and alternatives for the proposed anesthesia with the patient or authorized representative who has indicated his/her understanding and acceptance.     Dental Advisory Given  Plan Discussed with: Anesthesiologist, CRNA and Surgeon  Anesthesia Plan Comments:          Anesthesia Quick Evaluation

## 2024-05-06 NOTE — Anesthesia Postprocedure Evaluation (Signed)
 Anesthesia Post Note  Patient: Sherron FORBES Mayer  Procedure(s) Performed: PHACOEMULSIFICATION, CATARACT, WITH IOL INSERTION 6.91 00:41.4 (Right: Eye)  Patient location during evaluation: PACU Anesthesia Type: MAC Level of consciousness: awake and alert Pain management: pain level controlled Vital Signs Assessment: post-procedure vital signs reviewed and stable Respiratory status: spontaneous breathing, nonlabored ventilation, respiratory function stable and patient connected to nasal cannula oxygen Cardiovascular status: stable and blood pressure returned to baseline Postop Assessment: no apparent nausea or vomiting Anesthetic complications: no   No notable events documented.   Last Vitals:  Vitals:   05/06/24 0943 05/06/24 0948  BP: 122/77 104/86  Pulse: 71 76  Resp: (!) 9 16  Temp: 36.7 C 36.7 C  SpO2: 98% 99%    Last Pain:  Vitals:   05/06/24 0943  TempSrc:   PainSc: 0-No pain                 Prentice Murphy

## 2024-05-06 NOTE — Op Note (Signed)
 LOCATION:  [facility]   PREOPERATIVE DIAGNOSIS:    Nuclear sclerotic cataract right eye. H25.11   POSTOPERATIVE DIAGNOSIS:  Nuclear sclerotic cataract right eye.     PROCEDURE:  Phacoemusification with posterior chamber intraocular lens placement of the right eye   ULTRASOUND TIME: Procedure(s): PHACOEMULSIFICATION, CATARACT, WITH IOL INSERTION 6.91 00:41.4 (Right)  LENS:   Implant Name Type Inv. Item Serial No. Manufacturer Lot No. LRB No. Used Action  LENS IOL TECNIS EYHANCE 24.5 - [id number] Intraocular Lens LENS IOL TECNIS EYHANCE 24.5 [id number] [employer]  Right 1 Implanted         SURGEON:  Dene FABIENE Etienne, MD   ANESTHESIA:  Topical with tetracaine drops and 2% Xylocaine jelly, augmented with 1% preservative-free intracameral lidocaine.    COMPLICATIONS:  None.   DESCRIPTION OF PROCEDURE:  The patient was identified in the holding room and transported to the operating room and placed in the supine position under the operating microscope.  The right eye was identified as the operative eye and it was prepped and draped in the usual sterile ophthalmic fashion.   A 1 millimeter clear-corneal paracentesis was made at the 12:00 position.  0.5 ml of preservative-free 1% lidocaine was injected into the anterior chamber. The anterior chamber was filled with Viscoat viscoelastic.  A 2.4 millimeter keratome was used to make a near-clear corneal incision at the 9:00 position.  A curvilinear capsulorrhexis was made with a cystotome and capsulorrhexis forceps.  Balanced salt solution was used to hydrodissect and hydrodelineate the nucleus.   Phacoemulsification was then used in stop and chop fashion to remove the lens nucleus and epinucleus.  The remaining cortex was then removed using the irrigation and aspiration handpiece. Provisc was then placed into the capsular bag to distend it for lens placement.  A lens was then injected into the capsular bag.  The remaining  viscoelastic was aspirated.   Wounds were hydrated with balanced salt solution.  The anterior chamber was inflated to a physiologic pressure with balanced salt solution.  No wound leaks were noted. Cefuroxime 0.1 ml of a 10mg /ml solution was injected into the anterior chamber for a dose of 1 mg of intracameral antibiotic at the completion of the case.   Timolol and Brimonidine drops were applied to the eye.  The patient was taken to the recovery room in stable condition without complications of anesthesia or surgery.   Katera Rybka 05/06/2024, 9:41 AM

## 2024-05-06 NOTE — H&P (Signed)
 [facility]   Primary Care Physician:  Liana Fish, NP Ophthalmologist: Dr. Dene Etienne  Pre-Procedure History & Physical: HPI:  Kathy Mccormick is a 68 y.o. female here for ophthalmic surgery.   Past Medical History:  Diagnosis Date   Allergy    environmental   Anxiety    CIN I (cervical intraepithelial neoplasia I)    Endometriosis    GERD (gastroesophageal reflux disease)    History of actinic keratoses    Hyperlipidemia    Hypertension    Migraine    Migraines    Neck pain    STD (sexually transmitted disease)    HSV ll    Past Surgical History:  Procedure Laterality Date   BACK SURGERY     BREAST BIOPSY Right 11/15/2021   MM RT BREAST BX W LOC DEV 1ST LESION IMAGE BX SPEC STEREO GUIDE 11/15/2021 [facility]   CARPAL TUNNEL RELEASE     CARPAL TUNNEL RELEASE Bilateral 2005   CHOLECYSTECTOMY     COLPOSCOPY     OOPHORECTOMY     RSO,LSO   PELVIC LAPAROSCOPY  2001,2004   DL X 2 RSO and LSO   TONSILLECTOMY     TRIGGER FINGER RELEASE Right 08/24/2021   VAGINAL HYSTERECTOMY  2000   endometriosis    Prior to Admission medications   Medication Sig Start Date End Date Taking? Authorizing Provider  acyclovir  (ZOVIRAX ) 400 MG tablet Take 1 tablet (400 mg total) by mouth 2 (two) times daily. 12/04/23  Yes Abernathy, Fish, NP  ALPRAZolam  (XANAX ) 0.5 MG tablet Take 1 tablet (0.5 mg total) by mouth 2 (two) times daily as needed for anxiety. for anxiety 12/04/23  Yes Abernathy, Alyssa, NP  atorvastatin  (LIPITOR) 10 MG tablet Take 1 tablet (10 mg total) by mouth daily. 12/04/23  Yes Abernathy, Alyssa, NP  Azelaic Acid  15 % gel AFTER SKIN IS THOROUGHLY WASHED  AND PATTED DRY, GENTLY BUT  THOROUGHLY MASSAGE A THIN FILM  TOPICALLY INTO AFFECTED AREA  TWICE DAILY (MORNING / EVENING) 11/26/23  Yes Abernathy, Alyssa, NP  busPIRone  (BUSPAR ) 5 MG tablet TAKE 1-2 TABLETS (5-10 MG TOTAL) BY MOUTH 2 (TWO) TIMES DAILY. 12/31/23  Yes Abernathy, Alyssa, NP  calcipotriene   (DOVONOX) 0.005 % cream Apply topically 2 (two) times daily. As directed 07/26/22  Yes Moye, Virginia , MD  CHERRY PO Take by mouth.   Yes [provider]  ciclopirox  (LOPROX ) 0.77 % cream Apply topically 2 (two) times daily. To feet until resolved. 04/18/23  Yes Abernathy, Alyssa, NP  desonide  (DESOWEN ) 0.05 % cream APPLY 1 APPLICATION  TOPICALLY 3 TIMES DAILY AS  NEEDED 11/26/23  Yes Abernathy, Alyssa, NP  doxycycline  (VIBRA -TABS) 100 MG tablet Take 1 tablet (100 mg total) by mouth daily. 12/04/23  Yes Abernathy, Alyssa, NP  DULoxetine  (CYMBALTA ) 60 MG capsule TAKE 1 CAPSULE BY MOUTH 1 TO 2  TIMES DAILY 12/04/23  Yes Abernathy, Alyssa, NP  EPINEPHrine  0.3 mg/0.3 mL IJ SOAJ injection Inject 0.3 mg into the muscle as needed for anaphylaxis. 08/21/23  Yes Abernathy, Fish, NP  Erenumab -aooe (AIMOVIG ) 140 MG/ML SOAJ INJECT 140 MG INTO THE SKIN EVERY 30 DAYS 03/27/24  Yes Abernathy, Alyssa, NP  estradiol  (ESTRACE ) 0.1 MG/GM vaginal cream Place 1 Applicatorful vaginally 2 (two) times a week. 10/03/23  Yes Prentiss Riggs A, NP  fluorouracil  (EFUDEX ) 5 % cream Apply topically 2 (two) times daily. As directed 07/26/22  Yes Moye, Virginia , MD  fluticasone  (FLONASE ) 50 MCG/ACT nasal spray Place 1 spray into both  nostrils daily. 12/04/23  Yes Abernathy, Mardy, NP  levothyroxine  (SYNTHROID ) 50 MCG tablet Take 1 tablet (50 mcg total) by mouth daily before breakfast. 12/04/23  Yes Abernathy, Alyssa, NP  losartan  (COZAAR ) 100 MG tablet Take 1 tablet (100 mg total) by mouth daily. 12/04/23  Yes Abernathy, Mardy, NP  meloxicam  (MOBIC ) 15 MG tablet Take 1 tablet (15 mg total) by mouth daily as needed. 12/04/23  Yes Abernathy, Mardy, NP  montelukast  (SINGULAIR ) 10 MG tablet Take 1 tablet (10 mg total) by mouth at bedtime. 12/04/23  Yes Abernathy, Alyssa, NP  Multiple Vitamin (MULTIVITAMIN) tablet Take 1 tablet by mouth daily.   Yes [provider]  omeprazole  (PRILOSEC) 40 MG capsule Take 1 capsule (40 mg total) by  mouth daily. 12/04/23  Yes Abernathy, Mardy, NP  triamcinolone  (KENALOG ) 0.025 % ointment Twice daily for up to 1 week for cool down to face 07/26/22  Yes Moye, Virginia , MD  triamcinolone  cream (KENALOG ) 0.1 % Apply 1 Application topically 2 (two) times daily. 10/23/22  Yes Abernathy, Alyssa, NP  triamcinolone  ointment (KENALOG ) 0.1 % APPLY TWICE A DAY FOR UP TO 1 WEEK FOR COOL DOWN TO BODY 10/24/22  Yes Moye, Virginia , MD  zolmitriptan  (ZOMIG -ZMT) 5 MG disintegrating tablet DISSOLVE 1 TABLET IN MOUTH AS NEEDED FOR MIGRAINES. MAY REPEAT AFTER 2-3 HRS. MAX 2 TABS/24 HRS 03/27/24  Yes Abernathy, Alyssa, NP  zolpidem  (AMBIEN  CR) 12.5 MG CR tablet Take 1 tablet (12.5 mg total) by mouth at bedtime as needed for sleep. for sleep 04/18/23  Yes Liana Mardy, NP    Allergies as of 04/27/2024 - Review Complete 11/19/2023  Allergen Reaction Noted   Qulipta  [atogepant ] Anaphylaxis, Swelling, and Rash 10/07/2023    Family History  Problem Relation Age of Onset   Hypertension Father    Diabetes Mother    Hypertension Mother    Uterine cancer Mother    Liver disease Mother    Hypertension Brother    Heart Problems Brother    Breast cancer Maternal Aunt        Age 72's   Aneurysm Maternal Aunt     Social History   Socioeconomic History   Marital status: Married    Spouse name: Not on file   Number of children: Not on file   Years of education: Not on file   Highest education level: Not on file  Occupational History   Not on file  Tobacco Use   Smoking status: Former   Smokeless tobacco: Never  Vaping Use   Vaping status: Never Used  Substance and Sexual Activity   Alcohol use: No    Alcohol/week: 0.0 standard drinks of alcohol   Drug use: No   Sexual activity: Not Currently    Birth control/protection: Surgical, Post-menopausal    Comment: Hyst; more than 5, before 18, history of STD. no abnormal pap, no DES  Other Topics Concern   Not on file  Social History Narrative   Not  on file   Social Drivers of Health   Financial Resource Strain: Not on file  Food Insecurity: Not on file  Transportation Needs: Not on file  Physical Activity: Not on file  Stress: Not on file  Social Connections: Not on file  Intimate Partner Violence: Not on file    Review of Systems: See HPI, otherwise negative ROS  Physical Exam: BP 116/75   Pulse 71   Temp (!) 96.8 F (36 C) (Temporal)   Resp 16   Ht 5' 5 (1.651  m)   Wt 70.3 kg   SpO2 100%   BMI 25.79 kg/m  General:   Alert,  pleasant and cooperative in NAD Head:  Normocephalic and atraumatic. Lungs:  Clear to auscultation.    Heart:  Regular rate and rhythm.   Impression/Plan: Kathy Mccormick is here for ophthalmic surgery.  Risks, benefits, limitations, and alternatives regarding ophthalmic surgery have been reviewed with the patient.  Questions have been answered.  All parties agreeable.   Kathy GASKIN, MD  05/06/2024, 8:49 AM

## 2024-05-06 NOTE — Transfer of Care (Signed)
 Immediate Anesthesia Transfer of Care Note  Patient: SENYA HINZMAN  Procedure(s) Performed: PHACOEMULSIFICATION, CATARACT, WITH IOL INSERTION 6.91 00:41.4 (Right: Eye)  Patient Location: PACU  Anesthesia Type: MAC  Level of Consciousness: awake, alert  and patient cooperative  Airway and Oxygen Therapy: Patient Spontanous Breathing and Patient connected to supplemental oxygen  Post-op Assessment: Post-op Vital signs reviewed, Patient's Cardiovascular Status Stable, Respiratory Function Stable, Patent Airway and No signs of Nausea or vomiting  Post-op Vital Signs: Reviewed and stable  Complications: No notable events documented.

## 2024-05-07 ENCOUNTER — Encounter: Payer: Self-pay | Admitting: Ophthalmology

## 2024-05-07 DIAGNOSIS — H2512 Age-related nuclear cataract, left eye: Secondary | ICD-10-CM | POA: Diagnosis not present

## 2024-05-29 ENCOUNTER — Other Ambulatory Visit: Payer: Self-pay | Admitting: Nurse Practitioner

## 2024-05-29 DIAGNOSIS — Z760 Encounter for issue of repeat prescription: Secondary | ICD-10-CM

## 2024-06-01 NOTE — Discharge Instructions (Signed)

## 2024-06-02 ENCOUNTER — Encounter: Payer: Self-pay | Admitting: Nurse Practitioner

## 2024-06-02 ENCOUNTER — Ambulatory Visit: Admitting: Nurse Practitioner

## 2024-06-02 VITALS — BP 130/80 | HR 62 | Temp 96.6°F | Resp 16 | Ht 64.0 in | Wt 145.0 lb

## 2024-06-02 DIAGNOSIS — G43E19 Chronic migraine with aura, intractable, without status migrainosus: Secondary | ICD-10-CM

## 2024-06-02 DIAGNOSIS — F410 Panic disorder [episodic paroxysmal anxiety] without agoraphobia: Secondary | ICD-10-CM | POA: Diagnosis not present

## 2024-06-02 DIAGNOSIS — F411 Generalized anxiety disorder: Secondary | ICD-10-CM | POA: Diagnosis not present

## 2024-06-02 DIAGNOSIS — N952 Postmenopausal atrophic vaginitis: Secondary | ICD-10-CM | POA: Diagnosis not present

## 2024-06-02 DIAGNOSIS — K219 Gastro-esophageal reflux disease without esophagitis: Secondary | ICD-10-CM

## 2024-06-02 MED ORDER — PANTOPRAZOLE SODIUM 40 MG PO TBEC: 40.0000 mg | DELAYED_RELEASE_TABLET | Freq: Every day | ORAL | 3 refills | Status: AC

## 2024-06-02 MED ORDER — ESTRADIOL 0.01 % VA CREA: 1.0000 | TOPICAL_CREAM | Freq: Every day | VAGINAL | 12 refills | Status: AC

## 2024-06-02 MED ORDER — BUSPIRONE HCL 5 MG PO TABS: 5.0000 mg | ORAL_TABLET | Freq: Two times a day (BID) | ORAL | 1 refills | Status: AC

## 2024-06-02 MED ORDER — ZOLMITRIPTAN 5 MG PO TABS: 5.0000 mg | ORAL_TABLET | ORAL | 5 refills | Status: AC | PRN

## 2024-06-02 MED ORDER — ALPRAZOLAM 0.5 MG PO TABS: 0.5000 mg | ORAL_TABLET | Freq: Two times a day (BID) | ORAL | 2 refills | Status: AC | PRN

## 2024-06-02 NOTE — Anesthesia Preprocedure Evaluation (Signed)
 Anesthesia Evaluation  Patient identified by MRN, date of birth, ID band Patient awake    Reviewed: Allergy & Precautions, H&P , NPO status , Patient's Chart, lab work & pertinent test results  Airway Mallampati: II  TM Distance: >3 FB Neck ROM: Full    Dental no notable dental hx. (+) Missing, Caps   Pulmonary neg pulmonary ROS, former smoker   Pulmonary exam normal breath sounds clear to auscultation       Cardiovascular hypertension, negative cardio ROS Normal cardiovascular exam Rhythm:Regular Rate:Normal     Neuro/Psych  Headaches PSYCHIATRIC DISORDERS Anxiety     negative neurological ROS  negative psych ROS   GI/Hepatic negative GI ROS, Neg liver ROS,GERD  ,,  Endo/Other  negative endocrine ROSHypothyroidism    Renal/GU negative Renal ROS  negative genitourinary   Musculoskeletal negative musculoskeletal ROS (+) Arthritis ,    Abdominal   Peds negative pediatric ROS (+)  Hematology negative hematology ROS (+)   Anesthesia Other Findings Previous cataract surgery 05-06-24 Dr. Dario  HTN Anxiety Migraines Allergies Neck pain GERD   Reproductive/Obstetrics negative OB ROS                              Anesthesia Physical Anesthesia Plan  ASA: 2  Anesthesia Plan: MAC   Post-op Pain Management:    Induction: Intravenous  PONV Risk Score and Plan:   Airway Management Planned: Natural Airway and Nasal Cannula  Additional Equipment:   Intra-op Plan:   Post-operative Plan:   Informed Consent: I have reviewed the patients History and Physical, chart, labs and discussed the procedure including the risks, benefits and alternatives for the proposed anesthesia with the patient or authorized representative who has indicated his/her understanding and acceptance.     Dental Advisory Given  Plan Discussed with: Anesthesiologist, CRNA and Surgeon  Anesthesia Plan  Comments: (Patient consented for risks of anesthesia including but not limited to:  - adverse reactions to medications - damage to eyes, teeth, lips or other oral mucosa - nerve damage due to positioning  - sore throat or hoarseness - Damage to heart, brain, nerves, lungs, other parts of body or loss of life  Patient voiced understanding and assent.)         Anesthesia Quick Evaluation

## 2024-06-02 NOTE — Progress Notes (Unsigned)
 [facility] [address] [place], [place] [address]  Internal MEDICINE  Office Visit Note  Patient Name: Kathy Mccormick  [id number]  [id number]  Date of Service: 06/02/2024  Chief Complaint  Patient presents with   Gastroesophageal Reflux   Hypertension   Hyperlipidemia   Follow-up    HPI Kathy Mccormick presents for a follow-up visit for  Migraines -- has been taking aimovig  and this is working well to control her migraines. Reports having about 2 migraines per month now which is signficantly decreased. Was not able to get zomig  script filled since insurance does not cover ODT so need to switch to regular tablet.  Hypertension --     Current Medication: Outpatient Encounter Medications as of 06/02/2024  Medication Sig   estradiol  (ESTRACE ) 0.01 % CREA vaginal cream Place 1 Applicatorful vaginally at bedtime. Twice weekly   pantoprazole  (PROTONIX ) 40 MG tablet Take 1 tablet (40 mg total) by mouth daily.   zolmitriptan  (ZOMIG ) 5 MG tablet Take 1 tablet (5 mg total) by mouth as needed for migraine. MAY REPEAT AFTER 2-3 HRS. MAX 2 TABS/24 HRS   acyclovir  (ZOVIRAX ) 400 MG tablet TAKE 1 TABLET BY MOUTH TWICE A DAY   ALPRAZolam  (XANAX ) 0.5 MG tablet Take 1 tablet (0.5 mg total) by mouth 2 (two) times daily as needed for anxiety. for anxiety   atorvastatin  (LIPITOR) 10 MG tablet Take 1 tablet (10 mg total) by mouth daily.   Azelaic Acid  15 % gel AFTER SKIN IS THOROUGHLY WASHED  AND PATTED DRY, GENTLY BUT  THOROUGHLY MASSAGE A THIN FILM  TOPICALLY INTO AFFECTED AREA  TWICE DAILY (MORNING / EVENING)   busPIRone  (BUSPAR ) 5 MG tablet Take 1-2 tablets (5-10 mg total) by mouth 2 (two) times daily.   calcipotriene  (DOVONOX) 0.005 % cream Apply topically 2 (two) times daily. As directed   CHERRY PO Take by mouth.   ciclopirox  (LOPROX ) 0.77 % cream Apply topically 2 (two) times daily. To feet until resolved.   desonide  (DESOWEN ) 0.05 % cream APPLY 1 APPLICATION  TOPICALLY 3 TIMES DAILY AS   NEEDED   doxycycline  (VIBRA -TABS) 100 MG tablet TAKE 1 TABLET BY MOUTH EVERY DAY   DULoxetine  (CYMBALTA ) 60 MG capsule TAKE 1 CAPSULE BY MOUTH 1 TO 2  TIMES DAILY   EPINEPHrine  0.3 mg/0.3 mL IJ SOAJ injection Inject 0.3 mg into the muscle as needed for anaphylaxis.   Erenumab -aooe (AIMOVIG ) 140 MG/ML SOAJ INJECT 140 MG INTO THE SKIN EVERY 30 DAYS   estradiol  (ESTRACE ) 0.1 MG/GM vaginal cream Place 1 Applicatorful vaginally 2 (two) times a week.   fluorouracil  (EFUDEX ) 5 % cream Apply topically 2 (two) times daily. As directed   fluticasone  (FLONASE ) 50 MCG/ACT nasal spray Place 1 spray into both nostrils daily.   levothyroxine  (SYNTHROID ) 50 MCG tablet Take 1 tablet (50 mcg total) by mouth daily before breakfast.   losartan  (COZAAR ) 100 MG tablet Take 1 tablet (100 mg total) by mouth daily.   meloxicam  (MOBIC ) 15 MG tablet Take 1 tablet (15 mg total) by mouth daily as needed.   montelukast  (SINGULAIR ) 10 MG tablet Take 1 tablet (10 mg total) by mouth at bedtime.   Multiple Vitamin (MULTIVITAMIN) tablet Take 1 tablet by mouth daily.   omeprazole  (PRILOSEC) 40 MG capsule Take 1 capsule (40 mg total) by mouth daily.   triamcinolone  (KENALOG ) 0.025 % ointment Twice daily for up to 1 week for cool down to face   triamcinolone  cream (KENALOG ) 0.1 % Apply 1 Application topically  2 (two) times daily.   triamcinolone  ointment (KENALOG ) 0.1 % APPLY TWICE A DAY FOR UP TO 1 WEEK FOR COOL DOWN TO BODY   zolpidem  (AMBIEN  CR) 12.5 MG CR tablet Take 1 tablet (12.5 mg total) by mouth at bedtime as needed for sleep. for sleep   [DISCONTINUED] ALPRAZolam  (XANAX ) 0.5 MG tablet Take 1 tablet (0.5 mg total) by mouth 2 (two) times daily as needed for anxiety. for anxiety   [DISCONTINUED] busPIRone  (BUSPAR ) 5 MG tablet TAKE 1-2 TABLETS (5-10 MG TOTAL) BY MOUTH 2 (TWO) TIMES DAILY.   [DISCONTINUED] zolmitriptan  (ZOMIG -ZMT) 5 MG disintegrating tablet DISSOLVE 1 TABLET IN MOUTH AS NEEDED FOR MIGRAINES. MAY REPEAT AFTER  2-3 HRS. MAX 2 TABS/24 HRS   No facility-administered encounter medications on file as of 06/02/2024.    Surgical History: Past Surgical History:  Procedure Laterality Date   BACK SURGERY     BREAST BIOPSY Right 11/15/2021   MM RT BREAST BX W LOC DEV 1ST LESION IMAGE BX SPEC STEREO GUIDE 11/15/2021 [facility]   CARPAL TUNNEL RELEASE     CARPAL TUNNEL RELEASE Bilateral 2005   CATARACT EXTRACTION W/PHACO Right 05/06/2024   Procedure: PHACOEMULSIFICATION, CATARACT, WITH IOL INSERTION 6.91 00:41.4;  Surgeon: Kathy Gaskin, MD;  Location: [facility];  Service: Ophthalmology;  Laterality: Right;   CHOLECYSTECTOMY     COLPOSCOPY     OOPHORECTOMY     RSO,LSO   PELVIC LAPAROSCOPY  2001,2004   DL X 2 RSO and LSO   TONSILLECTOMY     TRIGGER FINGER RELEASE Right 08/24/2021   VAGINAL HYSTERECTOMY  2000   endometriosis    Medical History: Past Medical History:  Diagnosis Date   Allergy    environmental   Anxiety    CIN I (cervical intraepithelial neoplasia I)    Endometriosis    GERD (gastroesophageal reflux disease)    History of actinic keratoses    Hyperlipidemia    Hypertension    Migraine    Migraines    Neck pain    STD (sexually transmitted disease)    HSV ll    Family History: Family History  Problem Relation Age of Onset   Hypertension Father    Diabetes Mother    Hypertension Mother    Uterine cancer Mother    Liver disease Mother    Hypertension Brother    Heart Problems Brother    Breast cancer Maternal Aunt        Age 52's   Aneurysm Maternal Aunt     Social History   Socioeconomic History   Marital status: Married    Spouse name: Not on file   Number of children: Not on file   Years of education: Not on file   Highest education level: Not on file  Occupational History   Not on file  Tobacco Use   Smoking status: Former   Smokeless tobacco: Never  Vaping Use   Vaping status: Never Used  Substance and Sexual Activity    Alcohol use: No    Alcohol/week: 0.0 standard drinks of alcohol   Drug use: No   Sexual activity: Not Currently    Birth control/protection: Surgical, Post-menopausal    Comment: Hyst; more than 5, before 18, history of STD. no abnormal pap, no DES  Other Topics Concern   Not on file  Social History Narrative   Not on file   Social Drivers of Health   Financial Resource Strain: Not on file  Food Insecurity: Not on  file  Transportation Needs: Not on file  Physical Activity: Not on file  Stress: Not on file  Social Connections: Not on file  Intimate Partner Violence: Not on file      Review of Systems  Vital Signs: BP 130/80 Comment: 140/88  Pulse 62   Temp (!) 96.6 F (35.9 C)   Resp 16   Ht 5' 4 (1.626 m)   Wt 145 lb (65.8 kg) Comment: at home  SpO2 93%   BMI 24.89 kg/m    Physical Exam     Assessment/Plan:   General Counseling: Teasia verbalizes understanding of the findings of todays visit and agrees with plan of treatment. I have discussed any further diagnostic evaluation that may be needed or ordered today. We also reviewed her medications today. she has been encouraged to call the office with any questions or concerns that should arise related to todays visit.    No orders of the defined types were placed in this encounter.   Meds ordered this encounter  Medications   zolmitriptan  (ZOMIG ) 5 MG tablet    Sig: Take 1 tablet (5 mg total) by mouth as needed for migraine. MAY REPEAT AFTER 2-3 HRS. MAX 2 TABS/24 HRS    Dispense:  10 tablet    Refill:  5    Discontinue ODT zolmitriptan  and fill new script for regular tablet now. Thanks   pantoprazole  (PROTONIX ) 40 MG tablet    Sig: Take 1 tablet (40 mg total) by mouth daily.    Dispense:  30 tablet    Refill:  3    Discontinue omeprazole  and fill new script today.   busPIRone  (BUSPAR ) 5 MG tablet    Sig: Take 1-2 tablets (5-10 mg total) by mouth 2 (two) times daily.    Dispense:  360 tablet     Refill:  1   ALPRAZolam  (XANAX ) 0.5 MG tablet    Sig: Take 1 tablet (0.5 mg total) by mouth 2 (two) times daily as needed for anxiety. for anxiety    Dispense:  60 tablet    Refill:  2   estradiol  (ESTRACE ) 0.01 % CREA vaginal cream    Sig: Place 1 Applicatorful vaginally at bedtime. Twice weekly    Dispense:  42.5 g    Refill:  12    Fill new script today.    Return in about 3 months (around 08/26/2024) for F/U, anxiety med refill, [place] [facility] PCP.   Total time spent:*** Minutes Time spent includes review of chart, medications, test results, and follow up plan with the patient.   [place] Controlled Substance Database was reviewed by me.  This patient was seen by Mardy Maxin, FNP-C in collaboration with Dr. Sigrid Bathe as a part of collaborative care agreement.   Alyzae Hawkey R. Maxin, MSN, FNP-C Internal medicine

## 2024-06-03 ENCOUNTER — Ambulatory Visit: Payer: Self-pay | Admitting: Anesthesiology

## 2024-06-03 ENCOUNTER — Other Ambulatory Visit: Payer: Self-pay

## 2024-06-03 ENCOUNTER — Encounter: Payer: Self-pay | Admitting: Nurse Practitioner

## 2024-06-03 ENCOUNTER — Ambulatory Visit
Admission: RE | Admit: 2024-06-03 | Discharge: 2024-06-03 | Disposition: A | Attending: Ophthalmology | Admitting: Ophthalmology

## 2024-06-03 ENCOUNTER — Encounter
Admission: RE | Disposition: A | Discharge status: Home / Self Care | Payer: Self-pay | Source: Home / Self Care | Attending: Ophthalmology

## 2024-06-03 DIAGNOSIS — I10 Essential (primary) hypertension: Secondary | ICD-10-CM | POA: Diagnosis not present

## 2024-06-03 DIAGNOSIS — H2512 Age-related nuclear cataract, left eye: Secondary | ICD-10-CM | POA: Diagnosis not present

## 2024-06-03 DIAGNOSIS — N952 Postmenopausal atrophic vaginitis: Secondary | ICD-10-CM | POA: Insufficient documentation

## 2024-06-03 DIAGNOSIS — K219 Gastro-esophageal reflux disease without esophagitis: Secondary | ICD-10-CM | POA: Insufficient documentation

## 2024-06-03 DIAGNOSIS — E039 Hypothyroidism, unspecified: Secondary | ICD-10-CM | POA: Diagnosis not present

## 2024-06-03 DIAGNOSIS — Z87891 Personal history of nicotine dependence: Secondary | ICD-10-CM | POA: Diagnosis not present

## 2024-06-03 HISTORY — PX: CATARACT EXTRACTION W/PHACO: SHX586

## 2024-06-03 SURGERY — PHACOEMULSIFICATION, CATARACT, WITH IOL INSERTION
Anesthesia: Topical | Laterality: Left

## 2024-06-03 MED ORDER — TETRACAINE HCL 0.5 % OP SOLN
1.0000 [drp] | OPHTHALMIC | Status: DC | PRN
  Administered 2024-06-03 (×3): 1 [drp] via OPHTHALMIC

## 2024-06-03 MED ORDER — FENTANYL CITRATE (PF) 100 MCG/2ML IJ SOLN
INTRAMUSCULAR | Status: AC
  Filled 2024-06-03: qty 2

## 2024-06-03 MED ORDER — MIDAZOLAM HCL (PF) 2 MG/2ML IJ SOLN
INTRAMUSCULAR | Status: DC | PRN
  Administered 2024-06-03: 2 mg via INTRAVENOUS
  Administered 2024-06-03: 1 mg via INTRAVENOUS

## 2024-06-03 MED ORDER — FENTANYL CITRATE (PF) 100 MCG/2ML IJ SOLN
INTRAMUSCULAR | Status: DC | PRN
  Administered 2024-06-03 (×2): 50 ug via INTRAVENOUS

## 2024-06-03 MED ORDER — SIGHTPATH DOSE#1 BSS IO SOLN
INTRAOCULAR | Status: DC | PRN
  Administered 2024-06-03: 15 mL via INTRAOCULAR

## 2024-06-03 MED ORDER — CYCLOPENTOLATE HCL 2 % OP SOLN
1.0000 [drp] | OPHTHALMIC | Status: AC
  Administered 2024-06-03 (×2): 1 [drp] via OPHTHALMIC

## 2024-06-03 MED ORDER — SIGHTPATH DOSE#1 NA HYALUR & NA CHOND-NA HYALUR IO KIT
PACK | INTRAOCULAR | Status: DC | PRN
  Administered 2024-06-03: 1 via OPHTHALMIC

## 2024-06-03 MED ORDER — MIDAZOLAM HCL 2 MG/2ML IJ SOLN
INTRAMUSCULAR | Status: AC
  Filled 2024-06-03: qty 2

## 2024-06-03 MED ORDER — SIGHTPATH DOSE#1 BSS IO SOLN
INTRAOCULAR | Status: DC | PRN
  Administered 2024-06-03: 59 mL via OPHTHALMIC

## 2024-06-03 MED ORDER — PHENYLEPHRINE HCL 10 % OP SOLN
1.0000 [drp] | OPHTHALMIC | Status: AC
  Administered 2024-06-03 (×3): 1 [drp] via OPHTHALMIC

## 2024-06-03 MED ORDER — TETRACAINE HCL 0.5 % OP SOLN
OPHTHALMIC | Status: AC
  Filled 2024-06-03: qty 4

## 2024-06-03 MED ORDER — BRIMONIDINE TARTRATE-TIMOLOL 0.2-0.5 % OP SOLN
OPHTHALMIC | Status: DC | PRN
  Administered 2024-06-03: 1 [drp] via OPHTHALMIC

## 2024-06-03 MED ORDER — LIDOCAINE HCL (PF) 2 % IJ SOLN
INTRAOCULAR | Status: DC | PRN
  Administered 2024-06-03: 2 mL

## 2024-06-03 MED ORDER — CYCLOPENTOLATE HCL 2 % OP SOLN
OPHTHALMIC | Status: AC
  Filled 2024-06-03: qty 2

## 2024-06-03 MED ORDER — PHENYLEPHRINE HCL 10 % OP SOLN
OPHTHALMIC | Status: AC
  Filled 2024-06-03: qty 5

## 2024-06-03 MED ORDER — CEFUROXIME OPHTHALMIC INJECTION 1 MG/0.1 ML
INJECTION | OPHTHALMIC | Status: DC | PRN
  Administered 2024-06-03: 1 mg via INTRACAMERAL

## 2024-06-03 MED ORDER — LACTATED RINGERS IV SOLN: INTRAVENOUS | Status: DC

## 2024-06-03 SURGICAL SUPPLY — 7 items
FEE CATARACT SUITE SIGHTPATH (MISCELLANEOUS) ×1 IMPLANT
GLOVE BIOGEL PI IND STRL 8 (GLOVE) ×1 IMPLANT
GLOVE SURG LX STRL 7.5 STRW (GLOVE) ×1 IMPLANT
GLOVE SURG SYN 6.5 PF PI BL (GLOVE) ×1 IMPLANT
LENS IOL TECNIS EYHANCE 24.0 (Intraocular Lens) IMPLANT
NDL FILTER BLUNT 18X1 1/2 (NEEDLE) ×1 IMPLANT
SYR 3ML LL SCALE MARK (SYRINGE) ×1 IMPLANT

## 2024-06-03 NOTE — H&P (Signed)
 [facility]   Primary Care Physician:  Liana Fish, NP Ophthalmologist: Dr. Dene Etienne  Pre-Procedure History & Physical: HPI:  Kathy Mccormick is a 68 y.o. female here for ophthalmic surgery.   Past Medical History:  Diagnosis Date   Allergy    environmental   Anxiety    CIN I (cervical intraepithelial neoplasia I)    Endometriosis    GERD (gastroesophageal reflux disease)    History of actinic keratoses    Hyperlipidemia    Hypertension    Migraine    Migraines    Neck pain    STD (sexually transmitted disease)    HSV ll    Past Surgical History:  Procedure Laterality Date   BACK SURGERY     BREAST BIOPSY Right 11/15/2021   MM RT BREAST BX W LOC DEV 1ST LESION IMAGE BX SPEC STEREO GUIDE 11/15/2021 [facility]   CARPAL TUNNEL RELEASE     CARPAL TUNNEL RELEASE Bilateral 2005   CATARACT EXTRACTION W/PHACO Right 05/06/2024   Procedure: PHACOEMULSIFICATION, CATARACT, WITH IOL INSERTION 6.91 00:41.4;  Surgeon: Etienne Dene, MD;  Location: [facility];  Service: Ophthalmology;  Laterality: Right;   CHOLECYSTECTOMY     COLPOSCOPY     OOPHORECTOMY     RSO,LSO   PELVIC LAPAROSCOPY  2001,2004   DL X 2 RSO and LSO   TONSILLECTOMY     TRIGGER FINGER RELEASE Right 08/24/2021   VAGINAL HYSTERECTOMY  2000   endometriosis    Prior to Admission medications   Medication Sig Start Date End Date Taking? Authorizing Provider  acyclovir  (ZOVIRAX ) 400 MG tablet TAKE 1 TABLET BY MOUTH TWICE A DAY 06/01/24  Yes Abernathy, Alyssa, NP  ALPRAZolam  (XANAX ) 0.5 MG tablet Take 1 tablet (0.5 mg total) by mouth 2 (two) times daily as needed for anxiety. for anxiety 06/02/24  Yes Abernathy, Alyssa, NP  atorvastatin  (LIPITOR) 10 MG tablet Take 1 tablet (10 mg total) by mouth daily. 12/04/23  Yes Abernathy, Alyssa, NP  Azelaic Acid  15 % gel AFTER SKIN IS THOROUGHLY WASHED  AND PATTED DRY, GENTLY BUT  THOROUGHLY MASSAGE A THIN FILM  TOPICALLY INTO AFFECTED AREA   TWICE DAILY (MORNING / EVENING) 11/26/23  Yes Abernathy, Alyssa, NP  busPIRone  (BUSPAR ) 5 MG tablet Take 1-2 tablets (5-10 mg total) by mouth 2 (two) times daily. 06/02/24  Yes Abernathy, Alyssa, NP  calcipotriene  (DOVONOX) 0.005 % cream Apply topically 2 (two) times daily. As directed 07/26/22  Yes Moye, Virginia , MD  CHERRY PO Take by mouth.   Yes [provider]  ciclopirox  (LOPROX ) 0.77 % cream Apply topically 2 (two) times daily. To feet until resolved. 04/18/23  Yes Abernathy, Alyssa, NP  desonide  (DESOWEN ) 0.05 % cream APPLY 1 APPLICATION  TOPICALLY 3 TIMES DAILY AS  NEEDED 11/26/23  Yes Abernathy, Alyssa, NP  doxycycline  (VIBRA -TABS) 100 MG tablet TAKE 1 TABLET BY MOUTH EVERY DAY 06/01/24  Yes Abernathy, Alyssa, NP  DULoxetine  (CYMBALTA ) 60 MG capsule TAKE 1 CAPSULE BY MOUTH 1 TO 2  TIMES DAILY 12/04/23  Yes Abernathy, Alyssa, NP  EPINEPHrine  0.3 mg/0.3 mL IJ SOAJ injection Inject 0.3 mg into the muscle as needed for anaphylaxis. 08/21/23  Yes Abernathy, Fish, NP  Erenumab -aooe (AIMOVIG ) 140 MG/ML SOAJ INJECT 140 MG INTO THE SKIN EVERY 30 DAYS 03/27/24  Yes Abernathy, Alyssa, NP  estradiol  (ESTRACE ) 0.01 % CREA vaginal cream Place 1 Applicatorful vaginally at bedtime. Twice weekly 06/02/24  Yes Abernathy, Alyssa, NP  estradiol  (ESTRACE ) 0.1 MG/GM vaginal  cream Place 1 Applicatorful vaginally 2 (two) times a week. 10/03/23  Yes Prentiss Riggs A, NP  fluorouracil  (EFUDEX ) 5 % cream Apply topically 2 (two) times daily. As directed 07/26/22  Yes Moye, Virginia , MD  fluticasone  (FLONASE ) 50 MCG/ACT nasal spray Place 1 spray into both nostrils daily. 12/04/23  Yes Abernathy, Mardy, NP  levothyroxine  (SYNTHROID ) 50 MCG tablet Take 1 tablet (50 mcg total) by mouth daily before breakfast. 12/04/23  Yes Abernathy, Alyssa, NP  losartan  (COZAAR ) 100 MG tablet Take 1 tablet (100 mg total) by mouth daily. 12/04/23  Yes Abernathy, Mardy, NP  meloxicam  (MOBIC ) 15 MG tablet Take 1 tablet (15 mg total) by mouth  daily as needed. 12/04/23  Yes Abernathy, Mardy, NP  montelukast  (SINGULAIR ) 10 MG tablet Take 1 tablet (10 mg total) by mouth at bedtime. 12/04/23  Yes Abernathy, Alyssa, NP  Multiple Vitamin (MULTIVITAMIN) tablet Take 1 tablet by mouth daily.   Yes [provider]  omeprazole  (PRILOSEC) 40 MG capsule Take 1 capsule (40 mg total) by mouth daily. 12/04/23  Yes Abernathy, Mardy, NP  pantoprazole  (PROTONIX ) 40 MG tablet Take 1 tablet (40 mg total) by mouth daily. 06/02/24  Yes Abernathy, Mardy, NP  triamcinolone  (KENALOG ) 0.025 % ointment Twice daily for up to 1 week for cool down to face 07/26/22  Yes Moye, Virginia , MD  triamcinolone  cream (KENALOG ) 0.1 % Apply 1 Application topically 2 (two) times daily. 10/23/22  Yes Abernathy, Alyssa, NP  triamcinolone  ointment (KENALOG ) 0.1 % APPLY TWICE A DAY FOR UP TO 1 WEEK FOR COOL DOWN TO BODY 10/24/22  Yes Moye, Virginia , MD  zolmitriptan  (ZOMIG ) 5 MG tablet Take 1 tablet (5 mg total) by mouth as needed for migraine. MAY REPEAT AFTER 2-3 HRS. MAX 2 TABS/24 HRS 06/02/24  Yes Abernathy, Alyssa, NP  zolpidem  (AMBIEN  CR) 12.5 MG CR tablet Take 1 tablet (12.5 mg total) by mouth at bedtime as needed for sleep. for sleep 04/18/23  Yes Liana Mardy, NP    Allergies as of 04/27/2024 - Review Complete 11/19/2023  Allergen Reaction Noted   Qulipta  [atogepant ] Anaphylaxis, Swelling, and Rash 10/07/2023    Family History  Problem Relation Age of Onset   Hypertension Father    Diabetes Mother    Hypertension Mother    Uterine cancer Mother    Liver disease Mother    Hypertension Brother    Heart Problems Brother    Breast cancer Maternal Aunt        Age 72's   Aneurysm Maternal Aunt     Social History   Socioeconomic History   Marital status: Married    Spouse name: Not on file   Number of children: Not on file   Years of education: Not on file   Highest education level: Not on file  Occupational History   Not on file  Tobacco Use    Smoking status: Former   Smokeless tobacco: Never  Vaping Use   Vaping status: Never Used  Substance and Sexual Activity   Alcohol use: No    Alcohol/week: 0.0 standard drinks of alcohol   Drug use: No   Sexual activity: Not Currently    Birth control/protection: Surgical, Post-menopausal    Comment: Hyst; more than 5, before 18, history of STD. no abnormal pap, no DES  Other Topics Concern   Not on file  Social History Narrative   Not on file   Social Drivers of Health   Financial Resource Strain: Not on file  Food Insecurity:  Not on file  Transportation Needs: Not on file  Physical Activity: Not on file  Stress: Not on file  Social Connections: Not on file  Intimate Partner Violence: Not on file    Review of Systems: See HPI, otherwise negative ROS  Physical Exam: BP 120/70   Pulse 70   Temp (!) 97.5 F (36.4 C) (Temporal)   Ht 5' 5 (1.651 m)   Wt 66.7 kg   SpO2 100%   BMI 25.23 kg/m  General:   Alert,  pleasant and cooperative in NAD Head:  Normocephalic and atraumatic. Lungs:  Clear to auscultation.    Heart:  Regular rate and rhythm.   Impression/Plan: Sherron FORBES Mayer is here for ophthalmic surgery.  Risks, benefits, limitations, and alternatives regarding ophthalmic surgery have been reviewed with the patient.  Questions have been answered.  All parties agreeable.   MITTIE GASKIN, MD  06/03/2024, 8:18 AM

## 2024-06-03 NOTE — Op Note (Signed)
 OPERATIVE NOTE  Kathy Mccormick [id number] 06/03/2024   PREOPERATIVE DIAGNOSIS:  Nuclear sclerotic cataract left eye. H25.12   POSTOPERATIVE DIAGNOSIS:    Nuclear sclerotic cataract left eye.     PROCEDURE:  Phacoemusification with posterior chamber intraocular lens placement of the left eye  Ultrasound time: Procedure(s): PHACOEMULSIFICATION, CATARACT, WITH IOL INSERTION 5.06 00:24.9 (Left)  LENS:   Implant Name Type Inv. Item Serial No. Manufacturer Lot No. LRB No. Used Action  LENS IOL TECNIS EYHANCE 24.0 - [id number] Intraocular Lens LENS IOL TECNIS EYHANCE 24.0 [id number] [employer]  Left 1 Implanted      SURGEON:  Dene FABIENE Etienne, MD   ANESTHESIA:  Topical with tetracaine  drops and 2% Xylocaine  jelly, augmented with 1% preservative-free intracameral lidocaine .    COMPLICATIONS:  None.   DESCRIPTION OF PROCEDURE:  The patient was identified in the holding room and transported to the operating room and placed in the supine position under the operating microscope.  The left eye was identified as the operative eye and it was prepped and draped in the usual sterile ophthalmic fashion.   A 1 millimeter clear-corneal paracentesis was made at the 1:30 position.  0.5 ml of preservative-free 1% lidocaine  was injected into the anterior chamber.  The anterior chamber was filled with Viscoat viscoelastic.  A 2.4 millimeter keratome was used to make a near-clear corneal incision at the 10:30 position.  .  A curvilinear capsulorrhexis was made with a cystotome and capsulorrhexis forceps.  Balanced salt solution was used to hydrodissect and hydrodelineate the nucleus.   Phacoemulsification was then used in stop and chop fashion to remove the lens nucleus and epinucleus.  The remaining cortex was then removed using the irrigation and aspiration handpiece. Provisc was then placed into the capsular bag to distend it for lens placement.  A lens was then injected into the capsular bag.  The  remaining viscoelastic was aspirated.   Wounds were hydrated with balanced salt solution.  The anterior chamber was inflated to a physiologic pressure with balanced salt solution.  No wound leaks were noted. Cefuroxime  0.1 ml of a 10mg /ml solution was injected into the anterior chamber for a dose of 1 mg of intracameral antibiotic at the completion of the case.   Timolol  and Brimonidine  drops were applied to the eye.  The patient was taken to the recovery room in stable condition without complications of anesthesia or surgery.  Carolyn Maniscalco 06/03/2024, 8:41 AM

## 2024-06-03 NOTE — Anesthesia Postprocedure Evaluation (Signed)
 Anesthesia Post Note  Patient: Kathy Mccormick  Procedure(s) Performed: PHACOEMULSIFICATION, CATARACT, WITH IOL INSERTION 5.06 00:24.9 (Left)  Patient location during evaluation: PACU Anesthesia Type: MAC Level of consciousness: awake and alert Pain management: pain level controlled Vital Signs Assessment: post-procedure vital signs reviewed and stable Respiratory status: spontaneous breathing, nonlabored ventilation, respiratory function stable and patient connected to nasal cannula oxygen Cardiovascular status: stable and blood pressure returned to baseline Postop Assessment: no apparent nausea or vomiting Anesthetic complications: no   No notable events documented.   Last Vitals:  Vitals:   06/03/24 0843 06/03/24 0847  BP: 110/70 113/70  Pulse: 67 70  Resp: (!) 9 14  Temp: (!) 36.3 C (!) 36.3 C  SpO2: 99% 98%    Last Pain:  Vitals:   06/03/24 0847  TempSrc:   PainSc: 0-No pain                 Ayanni Tun C Nevaeh Korte

## 2024-06-03 NOTE — Transfer of Care (Signed)
 Immediate Anesthesia Transfer of Care Note  Patient: Kathy Mccormick  Procedure(s) Performed: PHACOEMULSIFICATION, CATARACT, WITH IOL INSERTION (Left)  Patient Location: PACU  Anesthesia Type: MAC  Level of Consciousness: awake, alert  and patient cooperative  Airway and Oxygen Therapy: Patient Spontanous Breathing and Patient connected to supplemental oxygen  Post-op Assessment: Post-op Vital signs reviewed, Patient's Cardiovascular Status Stable, Respiratory Function Stable, Patent Airway and No signs of Nausea or vomiting  Post-op Vital Signs: Reviewed and stable  Complications: No notable events documented.

## 2024-06-04 DIAGNOSIS — H2512 Age-related nuclear cataract, left eye: Secondary | ICD-10-CM | POA: Diagnosis not present

## 2024-08-26 ENCOUNTER — Ambulatory Visit: Admitting: Nurse Practitioner

## 2024-10-01 ENCOUNTER — Encounter: Admitting: Nurse Practitioner

## 2024-11-19 ENCOUNTER — Ambulatory Visit: Admitting: Nurse Practitioner
